# Patient Record
Sex: Female | Born: 1935 | Race: Black or African American | Hispanic: No | Smoking: Never smoker
Health system: Southern US, Community
[De-identification: ages and names within clinical notes are randomized; demographics above are authoritative.]

## PROBLEM LIST (undated history)

## (undated) DIAGNOSIS — N179 Acute kidney failure, unspecified: Secondary | ICD-10-CM

## (undated) DIAGNOSIS — R4020 Unspecified coma: Secondary | ICD-10-CM

## (undated) DIAGNOSIS — R748 Abnormal levels of other serum enzymes: Secondary | ICD-10-CM

## (undated) DIAGNOSIS — B20 Human immunodeficiency virus [HIV] disease: Secondary | ICD-10-CM

## (undated) DIAGNOSIS — F4321 Adjustment disorder with depressed mood: Secondary | ICD-10-CM

## (undated) HISTORY — PX: ABDOMINAL HYSTERECTOMY: SHX81

---

## 2001-03-20 ENCOUNTER — Inpatient Hospital Stay (HOSPITAL_COMMUNITY): Admission: EM | Admit: 2001-03-20 | Discharge: 2001-03-28 | Payer: Self-pay | Admitting: Emergency Medicine

## 2001-03-20 ENCOUNTER — Encounter (INDEPENDENT_AMBULATORY_CARE_PROVIDER_SITE_OTHER): Payer: Self-pay | Admitting: Specialist

## 2001-03-20 ENCOUNTER — Encounter: Payer: Self-pay | Admitting: Emergency Medicine

## 2001-03-21 ENCOUNTER — Encounter: Payer: Self-pay | Admitting: Internal Medicine

## 2001-03-25 ENCOUNTER — Encounter: Payer: Self-pay | Admitting: Internal Medicine

## 2001-03-25 ENCOUNTER — Encounter: Payer: Self-pay | Admitting: Hematology and Oncology

## 2001-04-12 ENCOUNTER — Encounter (INDEPENDENT_AMBULATORY_CARE_PROVIDER_SITE_OTHER): Payer: Self-pay | Admitting: *Deleted

## 2001-04-12 ENCOUNTER — Ambulatory Visit (HOSPITAL_COMMUNITY): Admission: RE | Admit: 2001-04-12 | Discharge: 2001-04-12 | Payer: Self-pay | Admitting: Internal Medicine

## 2001-04-12 ENCOUNTER — Encounter: Admission: RE | Admit: 2001-04-12 | Discharge: 2001-04-12 | Payer: Self-pay | Admitting: Internal Medicine

## 2001-04-12 LAB — CONVERTED CEMR LAB
CD4 Count: 10 microliters
CD4 T Cell Abs: 10

## 2001-04-19 ENCOUNTER — Encounter: Admission: RE | Admit: 2001-04-19 | Discharge: 2001-04-19 | Payer: Self-pay | Admitting: Internal Medicine

## 2001-04-20 ENCOUNTER — Encounter: Admission: RE | Admit: 2001-04-20 | Discharge: 2001-04-20 | Payer: Self-pay | Admitting: Infectious Diseases

## 2001-04-25 ENCOUNTER — Encounter: Admission: RE | Admit: 2001-04-25 | Discharge: 2001-04-25 | Payer: Self-pay | Admitting: Internal Medicine

## 2001-05-03 ENCOUNTER — Encounter (INDEPENDENT_AMBULATORY_CARE_PROVIDER_SITE_OTHER): Payer: Self-pay | Admitting: *Deleted

## 2001-05-03 ENCOUNTER — Other Ambulatory Visit: Admission: RE | Admit: 2001-05-03 | Discharge: 2001-05-03 | Payer: Self-pay | Admitting: Obstetrics

## 2001-05-03 ENCOUNTER — Encounter: Admission: RE | Admit: 2001-05-03 | Discharge: 2001-05-03 | Payer: Self-pay | Admitting: Internal Medicine

## 2001-05-03 LAB — CONVERTED CEMR LAB

## 2001-05-04 ENCOUNTER — Encounter: Admission: RE | Admit: 2001-05-04 | Discharge: 2001-05-04 | Payer: Self-pay | Admitting: Internal Medicine

## 2001-05-10 ENCOUNTER — Encounter: Admission: RE | Admit: 2001-05-10 | Discharge: 2001-05-10 | Payer: Self-pay | Admitting: Internal Medicine

## 2001-05-25 ENCOUNTER — Encounter: Admission: RE | Admit: 2001-05-25 | Discharge: 2001-05-25 | Payer: Self-pay | Admitting: Internal Medicine

## 2001-06-08 ENCOUNTER — Encounter: Admission: RE | Admit: 2001-06-08 | Discharge: 2001-06-08 | Payer: Self-pay | Admitting: Internal Medicine

## 2001-06-19 ENCOUNTER — Inpatient Hospital Stay (HOSPITAL_COMMUNITY): Admission: EM | Admit: 2001-06-19 | Discharge: 2001-07-01 | Payer: Self-pay | Admitting: Emergency Medicine

## 2001-06-27 ENCOUNTER — Encounter: Payer: Self-pay | Admitting: Internal Medicine

## 2001-07-12 ENCOUNTER — Encounter: Admission: RE | Admit: 2001-07-12 | Discharge: 2001-07-12 | Payer: Self-pay | Admitting: Internal Medicine

## 2001-07-22 ENCOUNTER — Encounter: Admission: RE | Admit: 2001-07-22 | Discharge: 2001-07-22 | Payer: Self-pay | Admitting: Internal Medicine

## 2001-07-25 ENCOUNTER — Encounter: Admission: RE | Admit: 2001-07-25 | Discharge: 2001-07-25 | Payer: Self-pay | Admitting: Internal Medicine

## 2001-07-29 ENCOUNTER — Encounter: Admission: RE | Admit: 2001-07-29 | Discharge: 2001-07-29 | Payer: Self-pay | Admitting: Internal Medicine

## 2001-08-05 ENCOUNTER — Encounter: Admission: RE | Admit: 2001-08-05 | Discharge: 2001-08-05 | Payer: Self-pay | Admitting: Internal Medicine

## 2001-08-15 ENCOUNTER — Encounter: Admission: RE | Admit: 2001-08-15 | Discharge: 2001-08-15 | Payer: Self-pay | Admitting: Internal Medicine

## 2001-08-22 ENCOUNTER — Encounter: Admission: RE | Admit: 2001-08-22 | Discharge: 2001-08-22 | Payer: Self-pay | Admitting: Internal Medicine

## 2001-08-30 ENCOUNTER — Encounter: Admission: RE | Admit: 2001-08-30 | Discharge: 2001-08-30 | Payer: Self-pay | Admitting: Internal Medicine

## 2001-09-09 ENCOUNTER — Encounter: Admission: RE | Admit: 2001-09-09 | Discharge: 2001-09-09 | Payer: Self-pay | Admitting: Internal Medicine

## 2001-09-19 ENCOUNTER — Encounter: Admission: RE | Admit: 2001-09-19 | Discharge: 2001-09-19 | Payer: Self-pay

## 2001-09-27 ENCOUNTER — Encounter: Admission: RE | Admit: 2001-09-27 | Discharge: 2001-09-27 | Payer: Self-pay | Admitting: Internal Medicine

## 2001-10-25 ENCOUNTER — Encounter: Admission: RE | Admit: 2001-10-25 | Discharge: 2001-10-25 | Payer: Self-pay | Admitting: Internal Medicine

## 2001-11-22 ENCOUNTER — Ambulatory Visit (HOSPITAL_COMMUNITY): Admission: RE | Admit: 2001-11-22 | Discharge: 2001-11-22 | Payer: Self-pay | Admitting: Internal Medicine

## 2001-11-22 ENCOUNTER — Encounter: Admission: RE | Admit: 2001-11-22 | Discharge: 2001-11-22 | Payer: Self-pay | Admitting: Internal Medicine

## 2001-11-23 ENCOUNTER — Encounter: Admission: RE | Admit: 2001-11-23 | Discharge: 2001-11-23 | Payer: Self-pay | Admitting: Internal Medicine

## 2001-12-06 ENCOUNTER — Encounter: Admission: RE | Admit: 2001-12-06 | Discharge: 2001-12-06 | Payer: Self-pay | Admitting: Internal Medicine

## 2001-12-06 ENCOUNTER — Ambulatory Visit (HOSPITAL_COMMUNITY): Admission: RE | Admit: 2001-12-06 | Discharge: 2001-12-06 | Payer: Self-pay | Admitting: Internal Medicine

## 2001-12-20 ENCOUNTER — Encounter: Admission: RE | Admit: 2001-12-20 | Discharge: 2001-12-20 | Payer: Self-pay | Admitting: Internal Medicine

## 2001-12-23 ENCOUNTER — Ambulatory Visit (HOSPITAL_COMMUNITY): Admission: RE | Admit: 2001-12-23 | Discharge: 2001-12-23 | Payer: Self-pay | Admitting: Internal Medicine

## 2001-12-23 ENCOUNTER — Encounter: Payer: Self-pay | Admitting: Internal Medicine

## 2002-01-03 ENCOUNTER — Encounter: Admission: RE | Admit: 2002-01-03 | Discharge: 2002-01-03 | Payer: Self-pay | Admitting: Internal Medicine

## 2002-01-16 ENCOUNTER — Encounter: Admission: RE | Admit: 2002-01-16 | Discharge: 2002-01-16 | Payer: Self-pay | Admitting: Internal Medicine

## 2002-01-24 ENCOUNTER — Encounter: Admission: RE | Admit: 2002-01-24 | Discharge: 2002-01-24 | Payer: Self-pay | Admitting: Internal Medicine

## 2002-02-15 ENCOUNTER — Encounter: Admission: RE | Admit: 2002-02-15 | Discharge: 2002-02-15 | Payer: Self-pay | Admitting: Internal Medicine

## 2002-03-22 ENCOUNTER — Encounter: Admission: RE | Admit: 2002-03-22 | Discharge: 2002-03-22 | Payer: Self-pay | Admitting: Internal Medicine

## 2002-04-11 ENCOUNTER — Encounter: Admission: RE | Admit: 2002-04-11 | Discharge: 2002-04-11 | Payer: Self-pay | Admitting: Internal Medicine

## 2002-05-09 ENCOUNTER — Encounter: Admission: RE | Admit: 2002-05-09 | Discharge: 2002-05-09 | Payer: Self-pay | Admitting: Internal Medicine

## 2002-05-23 ENCOUNTER — Encounter: Admission: RE | Admit: 2002-05-23 | Discharge: 2002-05-23 | Payer: Self-pay | Admitting: Internal Medicine

## 2002-06-05 ENCOUNTER — Encounter: Admission: RE | Admit: 2002-06-05 | Discharge: 2002-06-05 | Payer: Self-pay | Admitting: Internal Medicine

## 2002-06-15 ENCOUNTER — Ambulatory Visit (HOSPITAL_COMMUNITY): Admission: RE | Admit: 2002-06-15 | Discharge: 2002-06-15 | Payer: Self-pay | Admitting: Internal Medicine

## 2002-06-15 ENCOUNTER — Encounter: Admission: RE | Admit: 2002-06-15 | Discharge: 2002-06-15 | Payer: Self-pay | Admitting: Internal Medicine

## 2002-06-20 ENCOUNTER — Encounter: Admission: RE | Admit: 2002-06-20 | Discharge: 2002-06-20 | Payer: Self-pay | Admitting: Internal Medicine

## 2002-06-21 ENCOUNTER — Encounter: Admission: RE | Admit: 2002-06-21 | Discharge: 2002-06-21 | Payer: Self-pay | Admitting: Internal Medicine

## 2002-06-27 ENCOUNTER — Encounter: Admission: RE | Admit: 2002-06-27 | Discharge: 2002-06-27 | Payer: Self-pay | Admitting: Infectious Diseases

## 2002-07-04 ENCOUNTER — Encounter: Admission: RE | Admit: 2002-07-04 | Discharge: 2002-07-04 | Payer: Self-pay | Admitting: Internal Medicine

## 2002-07-25 ENCOUNTER — Encounter: Admission: RE | Admit: 2002-07-25 | Discharge: 2002-07-25 | Payer: Self-pay | Admitting: Internal Medicine

## 2002-08-08 ENCOUNTER — Encounter: Admission: RE | Admit: 2002-08-08 | Discharge: 2002-08-08 | Payer: Self-pay | Admitting: Internal Medicine

## 2002-08-25 ENCOUNTER — Encounter: Admission: RE | Admit: 2002-08-25 | Discharge: 2002-08-25 | Payer: Self-pay | Admitting: Internal Medicine

## 2002-09-28 ENCOUNTER — Encounter: Admission: RE | Admit: 2002-09-28 | Discharge: 2002-09-28 | Payer: Self-pay | Admitting: Internal Medicine

## 2002-11-21 ENCOUNTER — Encounter: Admission: RE | Admit: 2002-11-21 | Discharge: 2002-11-21 | Payer: Self-pay | Admitting: Internal Medicine

## 2002-11-28 ENCOUNTER — Encounter: Admission: RE | Admit: 2002-11-28 | Discharge: 2002-11-28 | Payer: Self-pay | Admitting: Internal Medicine

## 2002-12-13 ENCOUNTER — Encounter: Admission: RE | Admit: 2002-12-13 | Discharge: 2002-12-13 | Payer: Self-pay | Admitting: Internal Medicine

## 2003-01-16 ENCOUNTER — Encounter: Admission: RE | Admit: 2003-01-16 | Discharge: 2003-01-16 | Payer: Self-pay | Admitting: Internal Medicine

## 2003-02-12 ENCOUNTER — Encounter: Admission: RE | Admit: 2003-02-12 | Discharge: 2003-02-12 | Payer: Self-pay | Admitting: Internal Medicine

## 2003-02-27 ENCOUNTER — Encounter: Admission: RE | Admit: 2003-02-27 | Discharge: 2003-02-27 | Payer: Self-pay | Admitting: Internal Medicine

## 2003-03-13 ENCOUNTER — Encounter: Admission: RE | Admit: 2003-03-13 | Discharge: 2003-03-13 | Payer: Self-pay | Admitting: Internal Medicine

## 2003-05-08 ENCOUNTER — Encounter: Admission: RE | Admit: 2003-05-08 | Discharge: 2003-05-08 | Payer: Self-pay | Admitting: Internal Medicine

## 2003-05-22 ENCOUNTER — Encounter: Admission: RE | Admit: 2003-05-22 | Discharge: 2003-05-22 | Payer: Self-pay | Admitting: Internal Medicine

## 2003-06-05 ENCOUNTER — Encounter: Admission: RE | Admit: 2003-06-05 | Discharge: 2003-06-05 | Payer: Self-pay | Admitting: Internal Medicine

## 2003-07-02 ENCOUNTER — Encounter: Admission: RE | Admit: 2003-07-02 | Discharge: 2003-07-02 | Payer: Self-pay | Admitting: Internal Medicine

## 2003-09-04 ENCOUNTER — Encounter: Admission: RE | Admit: 2003-09-04 | Discharge: 2003-09-04 | Payer: Self-pay | Admitting: Internal Medicine

## 2003-10-23 ENCOUNTER — Encounter: Admission: RE | Admit: 2003-10-23 | Discharge: 2003-10-23 | Payer: Self-pay | Admitting: Internal Medicine

## 2003-11-27 ENCOUNTER — Encounter: Admission: RE | Admit: 2003-11-27 | Discharge: 2003-11-27 | Payer: Self-pay | Admitting: Internal Medicine

## 2003-12-07 ENCOUNTER — Encounter: Admission: RE | Admit: 2003-12-07 | Discharge: 2003-12-07 | Payer: Self-pay | Admitting: Infectious Diseases

## 2003-12-07 ENCOUNTER — Encounter (INDEPENDENT_AMBULATORY_CARE_PROVIDER_SITE_OTHER): Payer: Self-pay | Admitting: *Deleted

## 2004-02-12 ENCOUNTER — Encounter: Admission: RE | Admit: 2004-02-12 | Discharge: 2004-02-12 | Payer: Self-pay | Admitting: Internal Medicine

## 2004-06-08 ENCOUNTER — Encounter (INDEPENDENT_AMBULATORY_CARE_PROVIDER_SITE_OTHER): Payer: Self-pay | Admitting: *Deleted

## 2004-06-09 ENCOUNTER — Ambulatory Visit: Payer: Self-pay | Admitting: Internal Medicine

## 2004-08-12 ENCOUNTER — Ambulatory Visit: Payer: Self-pay | Admitting: Internal Medicine

## 2004-09-02 ENCOUNTER — Ambulatory Visit: Payer: Self-pay | Admitting: Internal Medicine

## 2004-09-29 ENCOUNTER — Ambulatory Visit: Payer: Self-pay | Admitting: Internal Medicine

## 2005-01-12 ENCOUNTER — Ambulatory Visit: Payer: Self-pay | Admitting: Internal Medicine

## 2005-01-15 ENCOUNTER — Encounter (INDEPENDENT_AMBULATORY_CARE_PROVIDER_SITE_OTHER): Payer: Self-pay | Admitting: *Deleted

## 2005-01-15 LAB — CONVERTED CEMR LAB
CD4 Count: 302 uL
HIV 1 RNA Quant: 49 {copies}/mL

## 2005-03-03 ENCOUNTER — Ambulatory Visit: Payer: Self-pay | Admitting: Internal Medicine

## 2005-05-04 ENCOUNTER — Ambulatory Visit: Payer: Self-pay | Admitting: Internal Medicine

## 2005-08-25 ENCOUNTER — Ambulatory Visit: Payer: Self-pay | Admitting: Internal Medicine

## 2006-01-04 ENCOUNTER — Ambulatory Visit: Payer: Self-pay | Admitting: Internal Medicine

## 2006-01-04 ENCOUNTER — Encounter (INDEPENDENT_AMBULATORY_CARE_PROVIDER_SITE_OTHER): Payer: Self-pay | Admitting: *Deleted

## 2006-01-04 LAB — CONVERTED CEMR LAB
CD4 Count: 414 microliters
HIV 1 RNA Quant: 49 copies/mL

## 2006-03-30 ENCOUNTER — Ambulatory Visit: Payer: Self-pay | Admitting: Internal Medicine

## 2006-04-05 ENCOUNTER — Ambulatory Visit: Payer: Self-pay | Admitting: Internal Medicine

## 2006-08-06 ENCOUNTER — Ambulatory Visit: Payer: Self-pay | Admitting: Internal Medicine

## 2006-08-06 LAB — CONVERTED CEMR LAB
ALT: 18 units/L (ref 0–35)
AST: 19 units/L (ref 0–37)
Albumin: 4 g/dL (ref 3.5–5.2)
Alkaline Phosphatase: 80 units/L (ref 39–117)
BUN: 15 mg/dL (ref 6–23)
Basophils Absolute: 0 10*3/uL (ref 0.0–0.1)
Basophils Relative: 0 % (ref 0–1)
CO2: 27 meq/L (ref 19–32)
Calcium: 9.4 mg/dL (ref 8.4–10.5)
Chloride: 105 meq/L (ref 96–112)
Cholesterol: 215 mg/dL — ABNORMAL HIGH (ref 0–200)
Creatinine, Ser: 0.84 mg/dL (ref 0.40–1.20)
Eosinophils Relative: 1 % (ref 0–4)
Glucose, Bld: 70 mg/dL (ref 70–99)
HCT: 39.3 % (ref 34.4–43.3)
HDL: 86 mg/dL (ref >39)
Hemoglobin: 12.4 g/dL (ref 11.7–14.8)
LDL Cholesterol: 114 mg/dL — ABNORMAL HIGH (ref 0–99)
Lymphocytes Relative: 35 % (ref 15–43)
Lymphs Abs: 1.8 10*3/uL (ref 0.8–3.1)
MCHC: 31.6 g/dL — ABNORMAL LOW (ref 33.1–35.4)
MCV: 108.6 fL — ABNORMAL HIGH (ref 78.8–100.0)
Monocytes Absolute: 0.7 10*3/uL (ref 0.2–0.7)
Monocytes Relative: 13 % — ABNORMAL HIGH (ref 3–11)
Neutro Abs: 2.6 10*3/uL (ref 1.8–6.8)
Neutrophils Relative %: 50 % (ref 47–77)
Platelets: 164 10*3/uL (ref 152–374)
Potassium: 4.5 meq/L (ref 3.5–5.3)
RBC: 3.62 M/uL — ABNORMAL LOW (ref 3.79–4.96)
RDW: 13.7 % (ref 11.5–15.3)
Sodium: 138 meq/L (ref 135–145)
Total Bilirubin: 0.5 mg/dL (ref 0.3–1.2)
Total CHOL/HDL Ratio: 2.5
Total Protein: 7.5 g/dL (ref 6.0–8.3)
Triglycerides: 76 mg/dL (ref <150)
VLDL: 15 mg/dL (ref 0–40)
WBC: 5.2 10*3/uL (ref 3.7–10.0)

## 2006-11-04 ENCOUNTER — Ambulatory Visit: Payer: Self-pay | Admitting: Internal Medicine

## 2006-11-15 ENCOUNTER — Encounter (INDEPENDENT_AMBULATORY_CARE_PROVIDER_SITE_OTHER): Payer: Self-pay | Admitting: *Deleted

## 2006-11-15 LAB — CONVERTED CEMR LAB

## 2006-11-28 ENCOUNTER — Encounter (INDEPENDENT_AMBULATORY_CARE_PROVIDER_SITE_OTHER): Payer: Self-pay | Admitting: *Deleted

## 2007-02-25 ENCOUNTER — Ambulatory Visit: Payer: Self-pay | Admitting: Internal Medicine

## 2007-02-25 DIAGNOSIS — B20 Human immunodeficiency virus [HIV] disease: Secondary | ICD-10-CM | POA: Insufficient documentation

## 2007-03-11 ENCOUNTER — Telehealth: Payer: Self-pay | Admitting: Internal Medicine

## 2007-03-16 ENCOUNTER — Telehealth: Payer: Self-pay | Admitting: Internal Medicine

## 2007-04-07 ENCOUNTER — Ambulatory Visit: Payer: Self-pay | Admitting: Internal Medicine

## 2007-04-07 DIAGNOSIS — N259 Disorder resulting from impaired renal tubular function, unspecified: Secondary | ICD-10-CM | POA: Insufficient documentation

## 2007-04-07 DIAGNOSIS — F329 Major depressive disorder, single episode, unspecified: Secondary | ICD-10-CM

## 2007-04-07 DIAGNOSIS — Z8669 Personal history of other diseases of the nervous system and sense organs: Secondary | ICD-10-CM | POA: Insufficient documentation

## 2007-04-07 DIAGNOSIS — R634 Abnormal weight loss: Secondary | ICD-10-CM

## 2007-04-07 DIAGNOSIS — K029 Dental caries, unspecified: Secondary | ICD-10-CM

## 2007-04-07 DIAGNOSIS — F411 Generalized anxiety disorder: Secondary | ICD-10-CM

## 2007-04-07 DIAGNOSIS — Z9189 Other specified personal risk factors, not elsewhere classified: Secondary | ICD-10-CM

## 2007-04-07 DIAGNOSIS — Z8709 Personal history of other diseases of the respiratory system: Secondary | ICD-10-CM | POA: Insufficient documentation

## 2007-04-07 DIAGNOSIS — Z8619 Personal history of other infectious and parasitic diseases: Secondary | ICD-10-CM

## 2007-04-07 DIAGNOSIS — I872 Venous insufficiency (chronic) (peripheral): Secondary | ICD-10-CM | POA: Insufficient documentation

## 2007-04-08 ENCOUNTER — Encounter (INDEPENDENT_AMBULATORY_CARE_PROVIDER_SITE_OTHER): Payer: Self-pay | Admitting: *Deleted

## 2007-04-21 ENCOUNTER — Ambulatory Visit (HOSPITAL_COMMUNITY): Admission: RE | Admit: 2007-04-21 | Discharge: 2007-04-21 | Payer: Self-pay | Admitting: Family Medicine

## 2007-06-01 ENCOUNTER — Telehealth: Payer: Self-pay | Admitting: Internal Medicine

## 2007-07-07 ENCOUNTER — Ambulatory Visit: Payer: Self-pay | Admitting: Internal Medicine

## 2007-07-07 ENCOUNTER — Encounter: Payer: Self-pay | Admitting: Internal Medicine

## 2007-07-07 LAB — CONVERTED CEMR LAB
ALT: 19 units/L (ref 0–35)
Albumin: 4.2 g/dL (ref 3.5–5.2)
Basophils Absolute: 0 10*3/uL (ref 0.0–0.1)
CO2: 23 meq/L (ref 19–32)
Calcium: 9.5 mg/dL (ref 8.4–10.5)
Chloride: 106 meq/L (ref 96–112)
Cholesterol: 225 mg/dL — ABNORMAL HIGH (ref 0–200)
Hemoglobin: 12.5 g/dL (ref 12.0–15.0)
Lymphocytes Relative: 48 % — ABNORMAL HIGH (ref 12–46)
Neutro Abs: 1.6 10*3/uL — ABNORMAL LOW (ref 1.7–7.7)
Platelets: 165 10*3/uL (ref 150–400)
Potassium: 4.2 meq/L (ref 3.5–5.3)
RDW: 13.3 % (ref 11.5–14.0)
Sodium: 141 meq/L (ref 135–145)
Total Protein: 7.4 g/dL (ref 6.0–8.3)
VLDL: 26 mg/dL (ref 0–40)
WBC: 3.8 10*3/uL — ABNORMAL LOW (ref 4.0–10.5)

## 2007-09-19 ENCOUNTER — Encounter (INDEPENDENT_AMBULATORY_CARE_PROVIDER_SITE_OTHER): Payer: Self-pay | Admitting: *Deleted

## 2007-09-20 ENCOUNTER — Encounter (INDEPENDENT_AMBULATORY_CARE_PROVIDER_SITE_OTHER): Payer: Self-pay | Admitting: *Deleted

## 2007-10-06 ENCOUNTER — Telehealth: Payer: Self-pay | Admitting: Internal Medicine

## 2007-10-20 ENCOUNTER — Ambulatory Visit: Payer: Self-pay | Admitting: Internal Medicine

## 2007-11-02 ENCOUNTER — Encounter (INDEPENDENT_AMBULATORY_CARE_PROVIDER_SITE_OTHER): Payer: Self-pay | Admitting: *Deleted

## 2007-12-09 ENCOUNTER — Encounter (INDEPENDENT_AMBULATORY_CARE_PROVIDER_SITE_OTHER): Payer: Self-pay | Admitting: *Deleted

## 2008-02-02 ENCOUNTER — Ambulatory Visit: Payer: Self-pay | Admitting: Internal Medicine

## 2008-03-14 ENCOUNTER — Telehealth: Payer: Self-pay | Admitting: Internal Medicine

## 2008-05-10 ENCOUNTER — Ambulatory Visit: Payer: Self-pay | Admitting: Internal Medicine

## 2008-05-10 ENCOUNTER — Encounter: Admission: RE | Admit: 2008-05-10 | Discharge: 2008-05-10 | Payer: Self-pay | Admitting: Internal Medicine

## 2008-05-10 LAB — CONVERTED CEMR LAB
ALT: 24 units/L (ref 0–35)
AST: 26 units/L (ref 0–37)
Basophils Absolute: 0 10*3/uL (ref 0.0–0.1)
CO2: 22 meq/L (ref 19–32)
Calcium: 9.2 mg/dL (ref 8.4–10.5)
Chloride: 107 meq/L (ref 96–112)
Cholesterol: 215 mg/dL — ABNORMAL HIGH (ref 0–200)
HIV 1 RNA Quant: 2020 copies/mL — ABNORMAL HIGH (ref <50)
HIV-1 RNA Quant, Log: 3.31 — ABNORMAL HIGH (ref <1.70)
Lymphocytes Relative: 49 % — ABNORMAL HIGH (ref 12–46)
Neutro Abs: 1.6 10*3/uL — ABNORMAL LOW (ref 1.7–7.7)
Neutrophils Relative %: 38 % — ABNORMAL LOW (ref 43–77)
Platelets: 158 10*3/uL (ref 150–400)
RDW: 14.5 % (ref 11.5–15.5)
Sodium: 140 meq/L (ref 135–145)
Total Bilirubin: 0.5 mg/dL (ref 0.3–1.2)
Total Protein: 7.5 g/dL (ref 6.0–8.3)
VLDL: 18 mg/dL (ref 0–40)

## 2008-05-24 ENCOUNTER — Ambulatory Visit: Payer: Self-pay | Admitting: Internal Medicine

## 2008-05-24 ENCOUNTER — Encounter (INDEPENDENT_AMBULATORY_CARE_PROVIDER_SITE_OTHER): Payer: Self-pay | Admitting: *Deleted

## 2008-05-24 DIAGNOSIS — F4321 Adjustment disorder with depressed mood: Secondary | ICD-10-CM | POA: Insufficient documentation

## 2008-05-24 HISTORY — DX: Adjustment disorder with depressed mood: F43.21

## 2008-06-28 ENCOUNTER — Encounter: Payer: Self-pay | Admitting: Internal Medicine

## 2008-07-05 ENCOUNTER — Ambulatory Visit: Payer: Self-pay | Admitting: Internal Medicine

## 2008-07-05 LAB — CONVERTED CEMR LAB

## 2008-07-11 ENCOUNTER — Encounter: Payer: Self-pay | Admitting: Internal Medicine

## 2008-07-12 ENCOUNTER — Telehealth: Payer: Self-pay

## 2008-07-20 ENCOUNTER — Ambulatory Visit: Payer: Self-pay | Admitting: Internal Medicine

## 2008-07-24 ENCOUNTER — Encounter: Payer: Self-pay | Admitting: Internal Medicine

## 2008-08-23 ENCOUNTER — Ambulatory Visit: Payer: Self-pay | Admitting: Internal Medicine

## 2008-08-30 ENCOUNTER — Ambulatory Visit: Payer: Self-pay | Admitting: Internal Medicine

## 2008-11-01 ENCOUNTER — Ambulatory Visit: Payer: Self-pay | Admitting: Internal Medicine

## 2008-11-01 ENCOUNTER — Encounter (INDEPENDENT_AMBULATORY_CARE_PROVIDER_SITE_OTHER): Payer: Self-pay | Admitting: *Deleted

## 2008-11-16 ENCOUNTER — Ambulatory Visit: Payer: Self-pay | Admitting: Internal Medicine

## 2008-11-16 ENCOUNTER — Encounter: Payer: Self-pay | Admitting: Internal Medicine

## 2008-11-16 LAB — CONVERTED CEMR LAB
ALT: 34 units/L (ref 0–35)
Albumin: 4.1 g/dL (ref 3.5–5.2)
CO2: 19 meq/L (ref 19–32)
Calcium: 9.2 mg/dL (ref 8.4–10.5)
Chloride: 110 meq/L (ref 96–112)
Cholesterol: 237 mg/dL — ABNORMAL HIGH (ref 0–200)
Glucose, Bld: 85 mg/dL (ref 70–99)
Potassium: 4.2 meq/L (ref 3.5–5.3)
Sodium: 141 meq/L (ref 135–145)
Total Protein: 7.1 g/dL (ref 6.0–8.3)
Triglycerides: 59 mg/dL (ref <150)
VLDL: 12 mg/dL (ref 0–40)

## 2009-02-01 ENCOUNTER — Encounter (INDEPENDENT_AMBULATORY_CARE_PROVIDER_SITE_OTHER): Payer: Self-pay | Admitting: *Deleted

## 2009-02-21 ENCOUNTER — Ambulatory Visit: Payer: Self-pay | Admitting: Internal Medicine

## 2009-02-21 DIAGNOSIS — R197 Diarrhea, unspecified: Secondary | ICD-10-CM

## 2009-03-28 ENCOUNTER — Telehealth (INDEPENDENT_AMBULATORY_CARE_PROVIDER_SITE_OTHER): Payer: Self-pay | Admitting: *Deleted

## 2009-03-28 ENCOUNTER — Ambulatory Visit: Payer: Self-pay | Admitting: Internal Medicine

## 2009-03-28 LAB — CONVERTED CEMR LAB
Albumin: 4 g/dL (ref 3.5–5.2)
BUN: 14 mg/dL (ref 6–23)
CO2: 24 meq/L (ref 19–32)
Cholesterol: 204 mg/dL — ABNORMAL HIGH (ref 0–200)
Glucose, Bld: 72 mg/dL (ref 70–99)
HDL: 84 mg/dL (ref >39)
Hemoglobin: 13.1 g/dL (ref 12.0–15.0)
Lymphocytes Relative: 47 % — ABNORMAL HIGH (ref 12–46)
Lymphs Abs: 1.9 10*3/uL (ref 0.7–4.0)
Monocytes Relative: 12 % (ref 3–12)
Neutro Abs: 1.6 10*3/uL — ABNORMAL LOW (ref 1.7–7.7)
Neutrophils Relative %: 40 % — ABNORMAL LOW (ref 43–77)
Potassium: 4.1 meq/L (ref 3.5–5.3)
RBC: 3.78 M/uL — ABNORMAL LOW (ref 3.87–5.11)
Sodium: 139 meq/L (ref 135–145)
Total CHOL/HDL Ratio: 2.4
Total Protein: 7 g/dL (ref 6.0–8.3)
Triglycerides: 87 mg/dL (ref <150)
WBC: 4.1 10*3/uL (ref 4.0–10.5)

## 2009-08-02 ENCOUNTER — Ambulatory Visit: Payer: Self-pay | Admitting: Internal Medicine

## 2009-08-02 ENCOUNTER — Encounter: Payer: Self-pay | Admitting: Internal Medicine

## 2009-08-02 LAB — CONVERTED CEMR LAB
Albumin: 4.2 g/dL (ref 3.5–5.2)
Alkaline Phosphatase: 81 units/L (ref 39–117)
BUN: 19 mg/dL (ref 6–23)
CD4 Count: 587 microliters
Creatinine, Ser: 0.8 mg/dL (ref 0.40–1.20)
Glucose, Bld: 80 mg/dL (ref 70–99)
HDL: 76 mg/dL (ref >39)
LDL Cholesterol: 110 mg/dL — ABNORMAL HIGH (ref 0–99)
Potassium: 3.9 meq/L (ref 3.5–5.3)
Total Bilirubin: 0.4 mg/dL (ref 0.3–1.2)
Triglycerides: 67 mg/dL (ref <150)

## 2009-09-03 ENCOUNTER — Ambulatory Visit: Payer: Self-pay | Admitting: Internal Medicine

## 2009-11-28 ENCOUNTER — Telehealth (INDEPENDENT_AMBULATORY_CARE_PROVIDER_SITE_OTHER): Payer: Self-pay | Admitting: *Deleted

## 2009-12-12 ENCOUNTER — Encounter (INDEPENDENT_AMBULATORY_CARE_PROVIDER_SITE_OTHER): Payer: Self-pay | Admitting: *Deleted

## 2009-12-12 ENCOUNTER — Ambulatory Visit: Payer: Self-pay | Admitting: Internal Medicine

## 2009-12-12 LAB — CONVERTED CEMR LAB
AST: 26 units/L (ref 0–37)
Albumin: 4 g/dL (ref 3.5–5.2)
Alkaline Phosphatase: 83 units/L (ref 39–117)
BUN: 17 mg/dL (ref 6–23)
Glucose, Bld: 69 mg/dL — ABNORMAL LOW (ref 70–99)
HDL: 77 mg/dL (ref >39)
HIV 1 RNA Quant: 709 copies/mL
LDL Cholesterol: 106 mg/dL — ABNORMAL HIGH (ref 0–99)
Sodium: 142 meq/L (ref 135–145)
Total Bilirubin: 0.4 mg/dL (ref 0.3–1.2)
Total CHOL/HDL Ratio: 2.6
Triglycerides: 81 mg/dL (ref <150)
VLDL: 16 mg/dL (ref 0–40)

## 2010-01-01 ENCOUNTER — Encounter: Payer: Self-pay | Admitting: Internal Medicine

## 2010-01-01 ENCOUNTER — Telehealth: Payer: Self-pay | Admitting: Internal Medicine

## 2010-01-09 ENCOUNTER — Encounter (INDEPENDENT_AMBULATORY_CARE_PROVIDER_SITE_OTHER): Payer: Self-pay | Admitting: *Deleted

## 2010-05-08 ENCOUNTER — Ambulatory Visit: Payer: Self-pay | Admitting: Internal Medicine

## 2010-05-08 LAB — CONVERTED CEMR LAB
ALT: 28 units/L (ref 0–35)
AST: 34 units/L (ref 0–37)
Albumin: 3.8 g/dL (ref 3.5–5.2)
Alkaline Phosphatase: 80 units/L (ref 39–117)
BUN: 14 mg/dL (ref 6–23)
Basophils Absolute: 0 10*3/uL (ref 0.0–0.1)
Basophils Relative: 0 % (ref 0–1)
Chloride: 107 meq/L (ref 96–112)
Eosinophils Absolute: 0 10*3/uL (ref 0.0–0.7)
HDL: 60 mg/dL (ref >39)
HIV 1 RNA Quant: 43900 copies/mL — ABNORMAL HIGH (ref <48)
HIV-1 RNA Quant, Log: 4.64 — ABNORMAL HIGH (ref <1.68)
LDL Cholesterol: 102 mg/dL — ABNORMAL HIGH (ref 0–99)
MCHC: 33 g/dL (ref 30.0–36.0)
MCV: 98.5 fL (ref 78.0–100.0)
Monocytes Relative: 9 % (ref 3–12)
Neutro Abs: 1.4 10*3/uL — ABNORMAL LOW (ref 1.7–7.7)
Neutrophils Relative %: 42 % — ABNORMAL LOW (ref 43–77)
Platelets: 131 10*3/uL — ABNORMAL LOW (ref 150–400)
Potassium: 4.2 meq/L (ref 3.5–5.3)
RDW: 14 % (ref 11.5–15.5)
Sodium: 140 meq/L (ref 135–145)

## 2010-05-13 ENCOUNTER — Encounter: Payer: Self-pay | Admitting: Internal Medicine

## 2010-06-05 ENCOUNTER — Ambulatory Visit: Payer: Self-pay | Admitting: Internal Medicine

## 2010-06-05 LAB — CONVERTED CEMR LAB: HIV 1 RNA Quant: 85400 copies/mL — ABNORMAL HIGH (ref <20)

## 2010-06-19 ENCOUNTER — Ambulatory Visit: Payer: Self-pay | Admitting: Internal Medicine

## 2010-07-24 ENCOUNTER — Ambulatory Visit: Payer: Self-pay | Admitting: Internal Medicine

## 2010-07-24 LAB — CONVERTED CEMR LAB: HIV-1 RNA Quant, Log: 2.83 — ABNORMAL HIGH (ref <1.30)

## 2010-09-04 ENCOUNTER — Ambulatory Visit: Payer: Self-pay | Admitting: Internal Medicine

## 2010-10-19 LAB — CONVERTED CEMR LAB
ALT: 18 units/L (ref 0–35)
ALT: 19 units/L (ref 0–35)
ALT: 21 units/L (ref 0–35)
ALT: 22 units/L (ref 0–35)
AST: 19 units/L (ref 0–37)
AST: 19 units/L (ref 0–37)
AST: 19 units/L (ref 0–37)
Albumin: 4.1 g/dL (ref 3.5–5.2)
Albumin: 4.1 g/dL (ref 3.5–5.2)
Alkaline Phosphatase: 77 units/L (ref 39–117)
Alkaline Phosphatase: 79 units/L (ref 39–117)
BUN: 15 mg/dL (ref 6–23)
BUN: 17 mg/dL (ref 6–23)
CD4 Count: 445 microliters
CD4 Count: 483 microliters
CD4 Count: 503 microliters
CD4 Count: 554 microliters
CO2: 22 meq/L (ref 19–32)
CO2: 22 meq/L (ref 19–32)
CO2: 24 meq/L (ref 19–32)
Calcium: 8.9 mg/dL (ref 8.4–10.5)
Calcium: 9.1 mg/dL (ref 8.4–10.5)
Calcium: 9.3 mg/dL (ref 8.4–10.5)
Calcium: 9.3 mg/dL (ref 8.4–10.5)
Chloride: 105 meq/L (ref 96–112)
Chloride: 108 meq/L (ref 96–112)
Chloride: 109 meq/L (ref 96–112)
Cholesterol: 207 mg/dL — ABNORMAL HIGH (ref 0–200)
Cholesterol: 221 mg/dL — ABNORMAL HIGH (ref 0–200)
Creatinine, Ser: 0.77 mg/dL (ref 0.40–1.20)
Creatinine, Ser: 0.8 mg/dL (ref 0.40–1.20)
Creatinine, Ser: 0.83 mg/dL (ref 0.40–1.20)
Creatinine, Ser: 0.89 mg/dL (ref 0.40–1.20)
Creatinine, Urine: 20.1 mg/dL
Glucose, Bld: 67 mg/dL — ABNORMAL LOW (ref 70–99)
Glucose, Bld: 93 mg/dL (ref 70–99)
HCV Ab: NEGATIVE
HDL: 81 mg/dL (ref >39)
HDL: 84 mg/dL (ref >39)
HIV 1 RNA Quant: 1601 copies/mL
HIV 1 RNA Quant: 198 copies/mL
HIV 1 RNA Quant: 416 copies/mL
HIV 1 RNA Quant: 49 copies/mL
Hep A Total Ab: NEGATIVE
Hep B Core Total Ab: POSITIVE — AB
Hepatitis B Surface Ag: NEGATIVE
LDL Cholesterol: 118 mg/dL — ABNORMAL HIGH (ref 0–99)
LDL Cholesterol: 118 mg/dL — ABNORMAL HIGH (ref 0–99)
LDL Cholesterol: 120 mg/dL — ABNORMAL HIGH (ref 0–99)
Potassium: 3.8 meq/L (ref 3.5–5.3)
Potassium: 3.8 meq/L (ref 3.5–5.3)
Sodium: 141 meq/L (ref 135–145)
Sodium: 142 meq/L (ref 135–145)
Sodium: 143 meq/L (ref 135–145)
Total Bilirubin: 0.4 mg/dL (ref 0.3–1.2)
Total Bilirubin: 0.4 mg/dL (ref 0.3–1.2)
Total Bilirubin: 0.5 mg/dL (ref 0.3–1.2)
Total CHOL/HDL Ratio: 2.5
Total CHOL/HDL Ratio: 2.6
Total CHOL/HDL Ratio: 2.6
Total CHOL/HDL Ratio: 2.9
Total Protein, Urine: 1
Total Protein, Urine: 3
Total Protein: 7.3 g/dL (ref 6.0–8.3)
Total Protein: 7.4 g/dL (ref 6.0–8.3)
Total Protein: 7.8 g/dL (ref 6.0–8.3)
Triglycerides: 82 mg/dL (ref <150)
Triglycerides: 83 mg/dL (ref <150)
Triglycerides: 97 mg/dL (ref <150)
VLDL: 15 mg/dL (ref 0–40)
VLDL: 17 mg/dL (ref 0–40)
VLDL: 17 mg/dL (ref 0–40)
VLDL: 19 mg/dL (ref 0–40)

## 2010-10-23 ENCOUNTER — Encounter: Payer: Self-pay | Admitting: Internal Medicine

## 2010-10-23 ENCOUNTER — Ambulatory Visit (INDEPENDENT_AMBULATORY_CARE_PROVIDER_SITE_OTHER): Payer: Medicare PPO

## 2010-10-23 DIAGNOSIS — B20 Human immunodeficiency virus [HIV] disease: Secondary | ICD-10-CM

## 2010-10-23 LAB — CONVERTED CEMR LAB
ALT: 16 units/L (ref 0–35)
AST: 24 units/L (ref 0–37)
Albumin: 4.3 g/dL (ref 3.5–5.2)
CD4 Count: 438 microliters
CO2: 25 meq/L (ref 19–32)
Calcium: 9.8 mg/dL (ref 8.4–10.5)
Chloride: 102 meq/L (ref 96–112)
Cholesterol: 234 mg/dL — ABNORMAL HIGH (ref 0–200)
Creatinine, Ser: 0.96 mg/dL (ref 0.40–1.20)
Creatinine, Urine: 118.3 mg/dL
HCV Ab: NEGATIVE
HIV 1 RNA Quant: 39 copies/mL
Hep A Total Ab: NEGATIVE
Hep B Core Total Ab: POSITIVE — AB
Hepatitis B Surface Ag: NEGATIVE
Potassium: 4.5 meq/L (ref 3.5–5.3)
Total Protein: 8 g/dL (ref 6.0–8.3)
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
## 2010-10-23 NOTE — Progress Notes (Signed)
Summary: Pharmacy unable to send meds can't reach pt  Phone Note From Pharmacy   Caller: [facility] ([facility] Details for Reason: [facility] phone number is [phone] Summary of Call: Received a message from [facility] pharmacy ([facility]) stating that they are trying to reach patient.  They tried calling her 3 times to set up her order.  They called to see if we had another phone number on file for her.  We have the same phone number in our system that they have.  I tried to call patient, but was unable to leave a message.  Will try again and inform the new pharmacy that patient is unreachable.  Hopefully, patient will call office before we move in reference to her medications not being delivered.   Initial call taken by: Paulo Fruit  BS,CPht II,MPH,  November 28, 2009 9:44 AM

## 2010-10-23 NOTE — Assessment & Plan Note (Signed)
   Process Orders Check Orders Results:     [facility]: Check successful Order queued for requisitioning for [facility]: May 08, 2010 10:56 AM  Tests Sent for requisitioning (May 08, 2010 10:56 AM):     05/08/2010: [facility] -- T-HIV Viral Load [phone] (signed)

## 2010-10-23 NOTE — Assessment & Plan Note (Signed)
Summary: STUDY APPT/ LH    Current Allergies: No known allergies  Vital Signs:  Patient profile:   77 year old female Menstrual status:  hysterectomy, partial Weight:      112.2 pounds (51 kg) BMI:     21.99 Temp:     97.0 degrees F oral Pulse rate:   43 / minute BP sitting:   118 / 64  (right arm) Is Patient Diabetic? No Research Study Name: ALLRT Pain Assessment Patient in pain? no      Nutritional Status BMI of 19 -24 = normal  Does patient need assistance? Functional Status Self care Ambulation Normal   Patient here for week 448 ALLRT study visit. She denies any new complaints, but is worried about her sister who is in the hospital. Deirdre Evener RN  December 12, 2009 11:51 AM   Other Orders: Est. Patient Research Study [id number]) T-Comprehensive Metabolic Panel [id number]) T-Lipid Profile [id number]) Process Orders Check Orders Results:     [facility]: ABN not required for this insurance Tests Sent for requisitioning (December 12, 2009 11:50 AM):     12/12/2009: [facility] -- T-Comprehensive Metabolic Panel [[id number]] (signed)     12/12/2009: [facility] -- T-Lipid Profile [phone] (signed)

## 2010-10-23 NOTE — Assessment & Plan Note (Signed)
Summary: STUDY APPT/ LH    Current Allergies: No known allergies  Vital Signs:  Patient profile:   77 year old female Menstrual status:  hysterectomy, partial Weight:      105.5 pounds (47.95 kg) BMI:     20.68 Temp:     97.4 degrees F oral Pulse rate:   44 / minute BP sitting:   143 / 71  (left arm) Is Patient Diabetic? No Pain Assessment Patient in pain? no      Nutritional Status BMI of 19 -24 = normal  Does patient need assistance? Functional Status Self care Ambulation Normal   Patient here for week 464 ALLRT study visit. She c/o fatigue recently related to her taking care of her sister who has been ill past few months. Last month she had a GI problem that lasted about 4days with signs of nausea, vomiting and diarrhea. She plans to schedule an appt to see Dr. Orvan Falconer soon. VL and CD4 were ordered off study for his review, since they were not part of this study visit.Deirdre Evener RN  May 08, 2010 11:19 AM   Other Orders: Est. Patient Research Study [phone]) T-CBC w/Diff [phone]) T-Lipid Profile [id number]) T-Comprehensive Metabolic Panel [id number])  Process Orders Check Orders Results:     [facility]: Order checked:     22930 -- T-Lipid Profile -- ABN required due to diagnosis (CPT: 80061) Tests Sent for requisitioning (May 08, 2010 11:16 AM):     05/08/2010: [facility] -- T-CBC w/Diff [[id number]] (signed)     05/08/2010: [facility] -- T-Lipid Profile [phone] (signed)     05/08/2010: [facility] -- T-Comprehensive Metabolic Panel [phone] (signed)

## 2010-10-23 NOTE — Miscellaneous (Signed)
Summary: HVI-1 RNA, CD4 (RESEARCH)  Clinical Lists Changes  Observations: Added new observation of CD4 COUNT: 502 microliters (12/12/2009 16:00) Added new observation of HIV1RNA QA: 709 copies/mL (12/12/2009 16:00)

## 2010-10-23 NOTE — Medication Information (Signed)
[facility]  [facility]   Imported By: Florinda Marker 01/02/2010 16:01:38  _____________________________________________________________________  External Attachment:    Type:   Image     Comment:   External Document

## 2010-10-23 NOTE — Miscellaneous (Signed)
Summary: clinical update/ryan white [facility] app completed  Clinical Lists Changes  Observations: Added new observation of PCTFPL: 146.60  (12/12/2009 9:30) Added new observation of HOUSEINCOME: 95621  (12/12/2009 9:30) Added new observation of FINASSESSDT: 12/12/2009  (12/12/2009 9:30)

## 2010-10-23 NOTE — Progress Notes (Signed)
Summary: refill/mld  Phone Note Refill Request      Prescriptions: ATIVAN 0.5 MG  TABS (LORAZEPAM) Take 1 tablet by mouth three times a day as needed for anxiety  #60 x 0   Entered by:   Paulo Fruit  BS,CPht II,MPH   Authorized by:   Cliffton Asters MD   Signed by:   Paulo Fruit  BS,CPht II,MPH on 01/01/2010   Method used:   Print[facility] ...       [facility] [address] [phone]* (retail)       [address]       [place], [place]  [address]       Ph: [phone] or [phone]       Fax: [phone]   RxID:   [id number]  See scanned approval. Paulo Fruit  BS,CPht II,MPH  January 01, 2010 4:39 PM

## 2010-10-23 NOTE — Assessment & Plan Note (Signed)
Summary: 6WKS F/U [MKJ]   CC:  follow-up visit.  History of Present Illness: [facility] Ruiz is in for her routine visit.  She denies missing a single dose of her medication since her last visit.  She takes Lomotil and finds that it helps her control her chronic diarrhea.  She continues to be bothered by chronic insomnia and occasional social anxiety.  Preventive Screening-Counseling & Management  Alcohol-Tobacco     Alcohol drinks/day: 0     Smoking Status: never     Passive Smoke Exposure: no  Caffeine-Diet-Exercise     Caffeine use/day: tea occassionally     Does Patient Exercise: yes     Type of exercise: active-yard work, stays busy     Exercise (avg: min/session): >60     Times/week: 6  Hep-HIV-STD-Contraception     HIV Risk: no risk noted     HIV Risk Counseling: not indicated-no HIV risk noted  Safety-Violence-Falls     Seat Belt Use: yes  Comments: declined condoms      Sexual History:  n/a.        Drug Use:  never.     Prior Medication List:  COMBIVIR 150-300 MG  TABS (LAMIVUDINE-ZIDOVUDINE) Take 1 tablet by mouth two times a day INTELENCE 100 MG TABS (ETRAVIRINE) Take2 tablets by mouth two times a day PREZISTA 300 MG TABS (DARUNAVIR) Take 2 tablets by mouth twice a day NORVIR 100 MG TABS (RITONAVIR) Take 1 tablet by mouth two times a day ATIVAN 0.5 MG  TABS (LORAZEPAM) Take 1 tablet by mouth three times a day as needed for anxiety AMBIEN 5 MG  TABS (ZOLPIDEM TARTRATE) Take 1 tablet by mouth at bedtime as needed sleep LOMOTIL 2.5-0.025 MG  TABS (DIPHENOXYLATE-ATROPINE) Take 1 tablet by mouth every 4 hours as needed   Current Allergies (reviewed today): No known allergies  Vital Signs:  Patient profile:   77 year old female Menstrual status:  hysterectomy, partial Height:      60 inches (152.40 cm) Weight:      105.75 pounds (48.07 kg) BMI:     20.73 Temp:     98.0 degrees F (36.67 degrees C) oral Pulse rate:   77 / minute BP sitting:   134 / 80  (left  arm) Cuff size:   regular  Vitals Entered By: Jennet Maduro RN (September 04, 2010 10:00 AM) CC: follow-up visit Is Patient Diabetic? No Pain Assessment Patient in pain? no      Nutritional Status BMI of 19 -24 = normal Nutritional Status Detail appetite "too good"  Have you ever been in a relationship where you felt threatened, hurt or afraid?No   Does patient need assistance? Functional Status Self care Ambulation Normal Comments no missed doses of rxes   Physical Exam  General:  alert and well-nourished.   Mouth:  good dentition, pharynx pink and moist, no erythema, and no exudates.   Lungs:  normal breath sounds.  no crackles and no wheezes.   Heart:  normal rate, regular rhythm, and no murmur.   Skin:  no rashes.   Axillary Nodes:  no R axillary adenopathy and no L axillary adenopathy.   Psych:  normally interactive, good eye contact, not anxious appearing, and not depressed appearing.          Medication Adherence: 09/04/2010   Adherence to medications reviewed with patient. Counseling to provide adequate adherence provided  Impression & Recommendations:  Problem # 1:  HIV INFECTION (ICD-042) I cannot explain her recent spike and viral load but fortunately it is trending back down.  I will continue her current regimen. Diagnostics Reviewed:  HIV: CDC-defined AIDS (08/30/2008)   CD4: 340 (07/25/2010)   CD4 %: 29.6 (11/04/2006) WBC: 3.4 (05/08/2010)   Hgb: 13.1 (05/08/2010)   HCT: 39.7 (05/08/2010)   Platelets: 131 (05/08/2010) HIV genotype: See Comment (06/19/2010)   HIV-1 RNA: 674 (07/24/2010)   HBSAg: No (11/15/2006)  Orders: Est. Patient Level IV (99214)Future Orders: T-CD4SP ([facility]) (CD4SP) ... 12/03/2010 T-HIV Viral Load [id number]) ... 12/03/2010 T-Comprehensive Metabolic Panel [id number]) ... 12/03/2010 T-CBC w/Diff ([id number]) ... 12/03/2010 T-RPR (Syphilis) [id number]) ... 12/03/2010 T-Lipid Profile  [phone]) ... 12/03/2010  Problem # 2:  DIARRHEA (ICD-787.91) This is under better control with Lomotil. Her updated medication list for this problem includes:    Lomotil 2.5-0.025 Mg Tabs (Diphenoxylate-atropine) .Marland Kitchen... Take 1 tablet by mouth every 4 hours as needed  Orders: Est. Patient Level IV (02725)  Problem # 3:  INSOMNIA, HX OF (ICD-V15.89) I have agreed to refill her Ambien for her to use carefully.  Problem # 4:  ANXIETY (ICD-300.00) I have agreed to refill her Ativan. Her updated medication list for this problem includes:    Ativan 0.5 Mg Tabs (Lorazepam) .Marland Kitchen... Take 1 tablet by mouth three times a day as needed for anxiety  Orders: Est. Patient Level IV (36644)  Patient Instructions: 1)  Please schedule a follow-up appointment in 3 months.  Prescriptions: ATIVAN 0.5 MG  TABS (LORAZEPAM) Take 1 tablet by mouth three times a day as needed for anxiety  #60 x 2   Entered and Authorized by:   Cliffton Asters MD   Signed by:   Cliffton Asters MD on 09/04/2010   Method used:   Print then Give to Patient   RxID:   [id number] AMBIEN 5 MG  TABS (ZOLPIDEM TARTRATE) Take 1 tablet by mouth at bedtime as needed sleep  #30 x 4   Entered and Authorized by:   Cliffton Asters MD   Signed by:   Cliffton Asters MD on 09/04/2010   Method used:   Print then Give to Patient   RxID:   [id number]   Prevention & Chronic Care Immunizations   Influenza vaccine: Fluvax MCR  (06/05/2010)    Tetanus booster: Not documented    Pneumococcal vaccine: Pneumovax (Medicare)  (04/07/2007)    H. zoster vaccine: Not documented  Colorectal Screening   Hemoccult: Not documented    Colonoscopy: Not documented  Other Screening   Pap smear: Hyst 2005 NI  (07/05/2008)    Mammogram: Not documented    DXA bone density scan: Not documented   Smoking status: never  (09/04/2010)  Lipids   Total Cholesterol: 177  (05/08/2010)   LDL: 102  (05/08/2010)   LDL Direct: Not documented    HDL: 60  (05/08/2010)   Triglycerides: 77  (05/08/2010)

## 2010-10-23 NOTE — Assessment & Plan Note (Signed)
Summary: F/U/VS   CC:  follow-up visit.  History of Present Illness: Al[facility] Ruiz is in for her routine visit. She has been taking one Imodium each day and finds that her diarrhea is much improved.  She denies missing any doses of her HIV medications since her last visit.  She gets it through [facility] in the mail it to her promptly each month.  Preventive Screening-Counseling & Management  Alcohol-Tobacco     Alcohol drinks/day: 0     Smoking Status: never     Passive Smoke Exposure: no  Caffeine-Diet-Exercise     Caffeine use/day: tea occassionally     Does Patient Exercise: yes     Type of exercise: active-yard work, stays busy     Exercise (avg: min/session): >60     Times/week: 6  Hep-HIV-STD-Contraception     HIV Risk: no     HIV Risk Counseling: not indicated-no HIV risk noted  Safety-Violence-Falls     Seat Belt Use: yes  Comments: declined condoms      Sexual History:  n/a.        Drug Use:  never.     Prior Medication List:  COMBIVIR 150-300 MG  TABS (LAMIVUDINE-ZIDOVUDINE) Take 1 tablet by mouth two times a day INTELENCE 100 MG TABS (ETRAVIRINE) Take2 tablets by mouth two times a day PREZISTA 300 MG TABS (DARUNAVIR) Take 2 tablets by mouth twice a day NORVIR 100 MG TABS (RITONAVIR) Take 1 tablet by mouth two times a day ATIVAN 0.5 MG  TABS (LORAZEPAM) Take 1 tablet by mouth three times a day as needed for anxiety AMBIEN 5 MG  TABS (ZOLPIDEM TARTRATE) Take 1 tablet by mouth at bedtime as needed sleep LOMOTIL 2.5-0.025 MG  TABS (DIPHENOXYLATE-ATROPINE) Take 1 tablet by mouth every 4 hours as needed   Current Allergies (reviewed today): No known allergies  Vital Signs:  Patient profile:   77 year old female Menstrual status:  hysterectomy, partial Height:      60 inches (152.40 cm) Weight:      105.5 pounds (47.95 kg) BMI:     20.68 Temp:     97.8 degrees F (36.56 degrees C) oral Pulse rate:   70 / minute BP sitting:   150 / 79  (left  arm) Cuff size:   regular  Vitals Entered By: Jennet Maduro RN (July 24, 2010 11:23 AM) CC: follow-up visit Is Patient Diabetic? No Pain Assessment Patient in pain? no      Nutritional Status BMI of 19 -24 = normal Nutritional Status Detail appetite "good"  Have you ever been in a relationship where you felt threatened, hurt or afraid?No   Does patient need assistance? Functional Status Self care Ambulation Normal Comments no missed doses of rxes   Physical Exam  General:  alert and well-nourished.   Mouth:  good dentition, pharynx pink and moist, no erythema, and no exudates.   Lungs:  normal breath sounds.  no crackles and no wheezes.   Heart:  normal rate, regular rhythm, and no murmur.          Medication Adherence: 07/24/2010   Adherence to medications reviewed with patient. Counseling to provide adequate adherence provided                                Impression & Recommendations:  Problem # 1:  HIV INFECTION (ICD-042) I still do not have an explanation for her rising  viral load over the past year.  She is had very good control in the past and it appears that her adherence is still very good.  She has no evidence of resistance mutations on a recent genotype.  Her diarrhea is under better control and this may lead to better absorption of her medications.  I will repeat her CD4 and viral load today and check with [facility] pharmacy to see that she is indeed being shipped all of the appropriate medications. Diagnostics Reviewed:  HIV: CDC-defined AIDS (08/30/2008)   CD4: 280 (06/06/2010)   CD4 %: 29.6 (11/04/2006) WBC: 3.4 (05/08/2010)   Hgb: 13.1 (05/08/2010)   HCT: 39.7 (05/08/2010)   Platelets: 131 (05/08/2010) HIV genotype: See Comment (06/19/2010)   HIV-1 RNA: 85400 (06/05/2010)   HBSAg: No (11/15/2006)  Orders: Est. Patient Level III ([address]) T-CD4SP ([facility]) (CD4SP) T-HIV Viral Load ([id number])  Patient Instructions: 1)  Please schedule  a follow-up appointment in 6 weeks.         Medication Adherence: 07/24/2010   Adherence to medications reviewed with patient. Counseling to provide adequate adherence provided                               Process Orders Check Orders Results:     [facility]: Check successful Tests Sent for requisitioning (July 24, 2010 12:58 PM):     07/24/2010: [facility] -- T-HIV Viral Load [phone] (signed)

## 2010-10-23 NOTE — Assessment & Plan Note (Signed)
Summary: 2WK F/U/VS   CC:  follow-up visit.  History of Present Illness: Alison Ruiz is in for her routine visit.  She says that she feels that her pill box every Sunday. She did not bring her pill box with her but she has all of her bottles of medications.  The bottles were all filled on September 8 but she did not receive them in the mail until September 15.  She denies missing any medication since her last visit.  Indeed, pill counts appear to be correct.  She does continue to have her chronic frequent bowel movements, usually about 3 to 4 daily.  They are usually loose and only occasionally diarrhea. She does not take Imodium on a regular basis because she worries that it might make her constipated.  Preventive Screening-Counseling & Management  Alcohol-Tobacco     Alcohol drinks/day: 0     Smoking Status: never     Passive Smoke Exposure: no  Caffeine-Diet-Exercise     Caffeine use/day: tea occassionally     Does Patient Exercise: yes     Type of exercise: active-yard work, stays busy     Exercise (avg: min/session): >60     Times/week: 6  Hep-HIV-STD-Contraception     HIV Risk: no     HIV Risk Counseling: not indicated-no HIV risk noted  Safety-Violence-Falls     Seat Belt Use: yes  Comments: declined condoms      Sexual History:  n/a.        Drug Use:  never.     Prior Medication List:  COMBIVIR 150-300 MG  TABS (LAMIVUDINE-ZIDOVUDINE) Take 1 tablet by mouth two times a day INTELENCE 100 MG TABS (ETRAVIRINE) Take2 tablets by mouth two times a day PREZISTA 300 MG TABS (DARUNAVIR) Take 2 tablets by mouth twice a day NORVIR 100 MG TABS (RITONAVIR) Take 1 tablet by mouth two times a day ATIVAN 0.5 MG  TABS (LORAZEPAM) Take 1 tablet by mouth three times a day as needed for anxiety AMBIEN 5 MG  TABS (ZOLPIDEM TARTRATE) Take 1 tablet by mouth at bedtime as needed sleep LOMOTIL 2.5-0.025 MG  TABS (DIPHENOXYLATE-ATROPINE) Take 1 tablet by mouth every 4 hours as  needed   Current Allergies (reviewed today): No known allergies  Vital Signs:  Patient profile:   78 year old female Menstrual status:  hysterectomy, partial Height:      60 inches (152.40 cm) Weight:      104 pounds (47.27 kg) BMI:     20.38 Temp:     98 .2 degrees F (36.78 degrees C) oral Pulse rate:   71 / minute BP sitting:   153 / 73  (right arm) Cuff size:   regular  Vitals Entered By: Jennet Maduro RN (June 19, 2010 2:33 PM) CC: follow-up visit Is Patient Diabetic? No Pain Assessment Patient in pain? no      Nutritional Status BMI of 19 -24 = normal Nutritional Status Detail appetite "good"  Have you ever been in a relationship where you felt threatened, hurt or afraid?No   Does patient need assistance? Functional Status Self care Ambulation Normal Comments no missed doses   Physical Exam  General:  alert and well-nourished.   Mouth:  good dentition, pharynx pink and moist, no erythema, and no exudates.   Lungs:  normal breath sounds.  no crackles and no wheezes.   Heart:  normal rate, regular rhythm, and no murmur.   Psych:  normally interactive, good eye contact, not anxious appearing, and not  depressed appearing.          Medication Adherence: 06/19/2010   Adherence to medications reviewed with patient. Counseling to provide adequate adherence provided                                Impression & Recommendations:  Problem # 1:  HIV INFECTION (ICD-042) It appears that her adherence is actually very good so I cannot explain her rising viral load.  A genotype resistance test in August revealed no significant mutations.  I will repeat a genotype today.  I doubt that her frequent bowel movements are contributing to significant malabsorption of her medications but I have suggested that she take one Imodium daily.  I will see her back in one month. Diagnostics Reviewed:  HIV: CDC-defined AIDS (08/30/2008)   CD4: 280 (06/06/2010)   CD4 %: 29.6  (11/04/2006) WBC: 3.4 (05/08/2010)   Hgb: 13.1 (05/08/2010)   HCT: 39.7 (05/08/2010)   Platelets: 131 (05/08/2010) HIV genotype: See Comment (05/13/2010)   HIV-1 RNA: 85400 (06/05/2010)   HBSAg: No (11/15/2006)  Orders: T-HIV Genotype (16109-60454) Est. Patient Level III (09811)  Patient Instructions: 1)  Please schedule a follow-up appointment in 1 month.         Medication Adherence: 06/19/2010   Adherence to medications reviewed with patient. Counseling to provide adequate adherence provided

## 2010-10-23 NOTE — Miscellaneous (Signed)
Summary: clinical update/ryan white NCADAP apprv til 12/20/10  Clinical Lists Changes  Observations: Added new observation of AIDSDAP: Yes 2011 (01/09/2010 14:41)

## 2010-10-23 NOTE — Assessment & Plan Note (Signed)
   Allergies: No Known Drug Allergies   Impression & Recommendations:  Problem # 1:  HIV INFECTION (ICD-042)  Orders: T-HIV Genotype ([id number])

## 2010-10-29 NOTE — Assessment & Plan Note (Signed)
   Vital Signs:  Patient profile:   77 year old female Menstrual status:  hysterectomy, partial Height:      58.5 inches (148.59 cm) Weight:      108.5 pounds (49.32 kg) BMI:     22.37 Temp:     97.8 degrees F oral Pulse rate:   76 / minute BP sitting:   134 / 74  (right arm) Is Patient Diabetic? No Pain Assessment Patient in pain? no      Nutritional Status BMI of 19 -24 = normal  Does patient need assistance? Functional Status Self care Ambulation Normal   Allergies: No Known Drug Allergies  Patient here for week 480 study visit. She denies any new problems and says she is taking her meds everyday. She does say that the diarrhea has eased some since she started taking 1 lomotil everyday. She will return in June for the next study appt.Deirdre Evener RN  October 23, 2010 10:36 AM   Other Orders: Est. Patient Research Study [id number]) T-Comprehensive Metabolic Panel [id number]) T-Hepatitis B Core Antibody [phone]) T-Hepatitis C Antibody [phone]) T-Hepatitis B Surface Antigen [id number]) T-Hepatitis A Antibody (59563-87564) T-Lipid Profile [id number]) T-Urine Protein [phone]) T-Urine Creatinine [phone])   Orders Added: 1)  Est. Patient Research Study [04200] 2)  T-Comprehensive Metabolic Panel [80053-22900] 3)  T-Hepatitis B Core Antibody [20254-27062] 4)  T-Hepatitis C Antibody [37628-31517] 5)  T-Hepatitis B Surface Antigen [61607-37106] 6)  T-Hepatitis A Antibody [26948-54627] 7)  T-Lipid Profile [80061-22930] 8)  T-Urine Protein [03500-93818] 9)  T-Urine Creatinine [82570-24070]

## 2010-11-12 NOTE — Miscellaneous (Signed)
Summary: HIV-1 RNA, CD4 (RESEARCH)  Clinical Lists Changes  Observations: Added new observation of CD4 COUNT: 438 microliters (10/23/2010 9:57) Added new observation of HIV1RNA QA: 39 copies/mL (10/23/2010 9:57)

## 2010-11-20 ENCOUNTER — Encounter (INDEPENDENT_AMBULATORY_CARE_PROVIDER_SITE_OTHER): Payer: Self-pay | Admitting: *Deleted

## 2010-11-27 NOTE — Miscellaneous (Signed)
Summary: adap UPDATE   Clinical Lists Changes  Observations: Added new observation of AIDSDAP: Pending-APPROVAL 2012  (11/20/2010 15:03) Added new observation of PCTFPL: 122.41  (11/20/2010 15:03) Added new observation of INCOMESOURCE: SSI  (11/20/2010 15:03) Added new observation of HOUSEINCOME: 16109  (11/20/2010 15:03) Added new observation of FINASSESSDT: 11/20/2010  (11/20/2010 15:03) Added new observation of YEARLYEXPEN: 0  (11/20/2010 15:03)

## 2010-12-04 ENCOUNTER — Encounter (INDEPENDENT_AMBULATORY_CARE_PROVIDER_SITE_OTHER): Payer: Self-pay | Admitting: *Deleted

## 2010-12-04 LAB — T-HELPER CELL (CD4) - (RCID CLINIC ONLY)
CD4 % Helper T Cell: 16 % — ABNORMAL LOW (ref 33–55)
CD4 T Cell Abs: 280 uL — ABNORMAL LOW (ref 400–2700)

## 2010-12-09 NOTE — Miscellaneous (Signed)
Summary: ADAP APPROVED TIL 06/21/11  Clinical Lists Changes  Observations: Added new observation of AIDSDAP: Yes 2012 (12/04/2010 17:50)

## 2011-02-06 NOTE — Procedures (Signed)
[place]. [facility]  Patient:    Alison Ruiz, Alison Ruiz                        MRN: [id number] Proc. Date: 03/28/01 Adm. Date:  [id number] Attending:  Alberteen Spindle, M.D.   Procedure Report  PROCEDURE:  Bone marrow biopsy and aspiration.  INDICATIONS:  Persistent leukopenia with B symptoms, rule out mild dysplasia of proliferative disease.  SUMMARY:  From right posterior iliac crest under 2% lidocaine local bone marrow biopsy and aspiration were performed with a disposable ______ needle. Good aspirate was obtained both for routine processing as well as cytogenetics and flow cytometry.  Biopsy was also obtained for routine processing.  There was minimal bleeding and no obvious complications.  Patient will remove her bandage tomorrow and can shower.  She will call [phone] for any problems or questions.  She will return to [facility] the [facility] results. DD:  03/28/01 TD:  03/28/01 Job: [id number]

## 2011-02-06 NOTE — Discharge Summary (Signed)
[place]. [facility]  Patient:    Alison Ruiz, Alison Ruiz Visit Number: [id number] MRN: [id number]          Service Type: MED Location: [id number] Attending Physician:  Phifer, Trinna Post Dictated by:   Lucille Passy, M.D. Admit Date:  06/19/2001 Discharge Date: 07/01/2001   CC:         Dewayne Shorter, M.D.  Velna Hatchet, M.D.   Discharge Summary  DATE OF BIRTH:  [birth year]  DISCHARGE DIAGNOSES: 1. ______-induced hepatic steatosis and lactic acidosis. 2. Human immunodeficiency virus positive. 3. Anemia. 4. Thrush.  CONSULTS:  Infectious disease, Dewayne Shorter, M.D.  DISCHARGE MEDICATIONS: 1. Bactrim Double Strength on p.o. q.d. 2. Riboflavin (vitamin B2) 100 mg p.o. b.i.d. 3. Zoloft 50 mg p.o. q.d. 4. Morphine Immediate Release 15 mg one p.o. q.4h. p.r.n. pain. 5. Multivitamins one p.o. q.d.  ACTIVITY:  As tolerated.  A home health care worker will visit Alison Ruiz three times a week to help her with her medications and to make sure she is still doing well as long as she needs.  FOLLOW-UP: 1. Velna Hatchet, M.D., at [facility] on Tuesday, July 05, 2001, at 9:45 a.m.  Her phone number is [phone]. 2. Dewayne Shorter, M.D., in the [place]. [facility].  The patient is to call for this    appointment.  His office number is [phone].  HISTORY OF PRESENT ILLNESS:  Alison Ruiz is a 77 year old African-American female who is HIV positive.  She presented to the [facility] ER on June 17, 2001, with a two-day history of abdominal pain. The pain was crampy in nature and initially was worse on her left side.  No nausea or vomiting was associate with this pain.  Of note, the patient is HIV positive and was recently enrolled in a clinical trial of Combivir and placebo versus Combivir, Trizivir, and Enavirenz.  It is unknown for sure which arm  of the study this patient was enrolled in.  ADMISSION LABORATORY DATA:  Sodium 134, potassium 3.7, chloride 99, CO2 25, BUN 16, creatinine 0.8, glucose 132, calcium 9.7.  White blood cell count 2.6, hemoglobin 11.6, MCV 99.9, platelets 198.  Lipase 36.  PT 13.1, INR 1.0, PTT 26.  A chest x-ray on admission showed no acute disease.  An abdominal x-ray on admission showed a normal gas pattern with a small left renal stone.  An EKG on admission showed left axis deviation and old septal myocardial infarction with slight concave ST elevation in lead V2, similar to an old EKG.  HOSPITAL COURSE: #1 - NRTI-INDUCED HEPATIC STEATOSIS AND LACTIC ACIDOSIS:  The patient had a CT scan of the abdomen and pelvis with IV and oral contrast of the abdomen and pelvis on hospital day #1, which showed no acute disease, no fluid collections, no adenopathy, no pelvic masses, and did not show significant renal nephrolithiasis on the left.  This study was compared to a study from March 25, 2001, and overall showed no change.  The patient had LFTs which showed AST 41, ALT 37, alkaline phosphatase 133, and total bilirubin 0.6.  A repeat lipase on hospital day #1 was again normal at 42.  Triglycerides were elevated at 220 and a GTT was elevated at 149.  A urinalysis showed 100 glucose, but was otherwise normal.  The patient did have a venous lactic acid level elevated at  3.8.  She was fecal occult blood negative during hospitalization. Blood cultures drawn on hospital day #1 are negative and blood cultures drawn on hospital day #6 are both negative at the time of dictation, but the final reading is negative.  As well, blood culture for acid-fast bacilli is negative at the time of dictation, drawn on hospital day #2.  An LDH was normal at 157. Serum ammonia levels were consistently normal during the hospitalization. Therefore, pain due to nephrolithiasis, urinary tract infection, lymphoma, gallbladder disease,  pancreatitis, and intestinal ischemia were ruled out by labs, radiologic studies, and a benign abdominal exam with only tenderness to palpation in the epigastric and right upper quadrant without distention or peritoneal signs.  However, the patients alkaline phosphatase and transaminases were elevated during admission, trending up at first to a maximum of alkaline phosphatase 991, AST 675, and ALT 659.  The patient had extreme abdominal pain during hospitalization controlled with IV morphine.  An infectious disease consult was obtained from Dewayne Shorter, M.D., who made the diagnosis of hepatic steatosis with lactic acidosis secondary to the patients antiretroviral medicines, most notably reverse transcriptase inhibitors.  As all other causes of her abdominal pain had been ruled out, it was felt that this was the most likely diagnosis.  In this disorder, reverse transcriptase inhibitors poison the liver mitochondria, causing the liver function tests to be elevated.  All of the patients HIV medicines were held at this time and the patient was started on nutritional supplements, thiamine, riboflavin, and a multivitamin, as studies have shown that these supplements can reverse the mitochondrial damage.  The patient was also started on the supplement L-carnitine, but a few days later broke out in a vesicular rash over her left mandibular region and trunk.  This rash was characterized by fluid-filled vesicles 2-3 mm in diameter with no surrounding edema or erythema.  This vesicles did cross the midline and were associated with only itching, no pain or burning, so shingles was thought to be extremely unlikely. Dewayne Shorter, M.D., felt that this rash was due to either diffuse viral infection versus a drug reaction to the L-carnitine was discontinued at that time.  During the course of hospitalization, the patients alkaline phosphatase and transaminase levels slowly resolved until on the day  of  discharge they were alkaline phosphatase 841, AST 81, and ALT 210.  The patients total bilirubin levels did increase from consistently normal to elevated at 1.7 on the day of discharge, but this was felt to be consistent with slow resolution course of her hepatic steatosis.  As well, the patients lactic acidosis level did resolve from 3.8 to normal at 2.0 eight days prior to discharge.  The patients abdominal pain slowly resolved during hospitalization and on the day prior to discharge the patients abdominal pain was well controlled with infrequent doses of p.o. immediate release morphine. The patient will be discharged on p.o. morphine, which controlled her pain very well at the time of discharge.  Also of note, the patient did become febrile on hospital day #8 with a T-max of 101.9 degrees.  Her white blood cell count had decreased from the admission level to 1.8 at that time.  The patients x-ray on that day was normal with no infiltrates and the patient also had a normal urinalysis on that day.  So this fever was felt to be most likely due to her hepatic inflammation versus a viral infection that may have caused her vesicular rash.  After that  day, the patient remained consistently afebrile during the rest of her hospitalization and her white blood cell count rebounded to 3.8 at the time of discharge.  The patients HIV regimen will need to be adjusted in the future with regard to her illness induced by her reverse transcriptase inhibitor.  #2 - HUMAN IMMUNODEFICIENCY VIRUS POSITIVE:  The patient is followed by Dewayne Shorter, M.D., in the [facility]. [facility].  Her absolute CD4 count on June 08, 2001, was 55.  As noted previously, the patient was enrolled in an HIV medication trial, and although the arm that the patient was enrolled in is not known for sure, she was likely enrolled in the active drug arm of the study because of her  drug-induced hepatic steatosis with lactic acidosis.  The patients HIV medicines were all held during hospitalization due to her illness, but they may be restarted at the discretion of Dewayne Shorter, M.D., on an outpatient basis.  #3 - ANEMIA:  The patient had macrocytic anemia on admission with a hemoglobin of 11.6 and an MCV at the far upper range of normal at 99.9.  Later during the hospitalization, her MCV climbed to 102.8, but with the hemoglobin at the lowest 8.5.  The patient did have negative stool guaiacs during admission.  On the day prior to discharge, her hemoglobin had improved to 9.8 with an MCV of 101.2.  This macrocytic anemia was thought to be most likely due to HIV medicines, which are known to cause macrocytic effect, but likely also has a component of anemia of chronic disease with regards to the patients severe HIV disease with low CD4 count.  #4 - THRUSH:  The patient had white plaques over her hard palate, soft palate, and tongue on admission.  She was treated with Nystatin Swish and Swallow and Mycelex troches during admission with excellent resolution of her thrush. These medicines will not be continued on an outpatient basis as the patients thrush has resolved, but she may need them in the future if her thrush recurs. At the time of discharge, the patient has been afebrile for greater than 24 hours.  Please note that the patient was ruled out for cardiac ischemia with cardiac enzymes negative x 2.  Pending labs at the time of dictation include final readings on blood cultures ordered on June 25, 2001.  All of these cultures are read as preliminary negative at the time of admission. Dictated by:   Lucille Passy, M.D. Attending Physician:  Phifer, Trinna Post DD:  07/02/01 TD:  07/02/01 Job: [id number]

## 2011-02-06 NOTE — Discharge Summary (Signed)
[place]. [facility]  Patient:    Alison Ruiz, Alison Ruiz                        MRN: [id number] Adm. Date:  [id number] Disc. Date: [id number] Attending:  Dorothyann Peng N                           Discharge Summary  CONSULTATIONS: 1. Neurology, Dr. Orlin Hilding. 2. Hematology, Dr. Catha Gosselin. 3. Vascular surgery, Dr. Madilyn Fireman. 4. ENT, Dr. Pollyann Kennedy.  HISTORY OF PRESENT ILLNESS:  Alison Ruiz is a 77 year old African-American female without significant past medical history.  She was brought in by her sister for evaluation of worsening confusion.  Her sister states that the symptoms of confusion and weight loss started about two months ago after having oral surgery.  She is currently being treated for thrush by her dentist.  Her sister stated that she lost about 12-15 pounds and is increasingly disoriented.  According to the sister, several days ago the patient was unable to turn off the lights because she had forgotten how to. The patients sister denied recent falls, urinary incontinence, headache, or gait impairment.  There is associated fever.  There are no complaints of sleep disorder.  There is increased thirst.  The patient says she has lost weight because she is unable to eat because of her oral surgery.  PAST MEDICAL HISTORY:  Denies significant past medical history.  PAST SURGICAL HISTORY:  Denied any surgical history.  However, after further questioning, she reports a hysterectomy 20-30 years ago.  MEDICATIONS:  Oral medications for her oral yeast infection.  ALLERGIES:  No known drug allergies.  FAMILY HISTORY:  Mother died of colon cancer.  Father died of an MI.  Nephew with diabetes.  There is no history of breast cancer in the family.  SOCIAL HISTORY:  She lives alone.  She is divorced.  She works at [employer].   She denies alcohol use , illicit drug use, and tobacco use. She claims to have independent ADLs.  REVIEW OF SYSTEMS:  Significant for 10- to 15-pound  weight loss.  Denies cough, nose bleeds, nausea, vomiting, bright red blood per rectum, and gait difficulties.  PHYSICAL EXAMINATION:  VITAL SIGNS:  Temperature 98.1, blood pressure 119/70, heart rate of 43, respiratory rate 20, pulse oximetry 97%.  GENERAL:  Thin female lying in bed, very pleasant.  HEENT:  Normocephalic, atraumatic.  She has a wig on in place.  Anicteric sclerae.  Pupils equally round and reactive to light.  Oral thrush was noted. Fair dentition.  NECK:  No JVD, no bruits.  Normal carotid upstroke.  No nuchal rigidity.  CHEST:  Clear anteriorly and posteriorly.  CARDIAC:  Bradycardia, without murmur.  ABDOMEN:  Flat, soft, nontender, with normal bowel sounds.  RECTAL:  The patient refused rectal exam.  GU, BREASTS, PELVIC:  Deferred.  EXTREMITIES:  No edema, although her right lower leg had diffuse hyperpigmentation, multiple venous varicosities, and her distal pulses were palpable.  No ulcerations were noted.  NEUROLOGIC:  Awake, alert, and oriented to self, place, and time, not the year.  She moves all extremities.  Had clear speech.  There was skin tenting. Mini-Mental exam was significant for score of 19.  LABORATORY DATA:  EKG significant for sinus bradycardia at 60 beats per minute, Qs in V1 and V3.  White count significant for 2.9, hemoglobin 9.0, with a normal differential. Blood cultures were  negative to date.  Chest x-ray negative.  Head CT showed no acute findings, with an incidental finding of an 8 mm meningioma lateral to the left temporal lobe, and it was calcified.  HOSPITAL COURSE:  She was admitted to the floor for altered mental status. Significant history included oral thrush and weight loss.  Differential diagnosis included metabolic disorder like hypothyroidism, B12, folate deficiency, encephalopathy.  Infectious etiology was considered because of her fever and leukopenia.  B12 and folate were ordered, as well as an RPR.  MRI  of the brain was ordered as well.  She was seen by care management for possible change in disposition in case the patient was unable to go home alone. Laboratories came back significant for normal TSH of 1.026.  UA was significant for many bacteria, so she was started on IV antibiotics because the patient had poor p.o. intake at the time.  She had a nonreactive RPR, normal B12 level.  She had a high sedimentation rate at 39, normal ferritin, normal electrolytes.  Venous Dopplers were ordered because of the multiple venous varicosities on the right lower extremity, and they were negative. Neurology was consulted for further evaluation of her confusion.  They agreed with the MRI of the brain, and they ordered an EEG.  The EEG was normal; awake and drowsy.  The patient continued to have a persistent fever between 100 degrees and 101 degrees.  Dr. Catha Gosselin was consulted of hematology because of her persistent leukopenia and her anemia.  He suggested to obtain a CT of the neck, chest, abdomen, and pelvis to rule out internal adenopathy, which came back negative for any abnormal findings.  They also suggested an ENT evaluation because of her pansinusitis noted on the MRI.  ENT was consulted, and they did not think that her fevers were attributed to her chronic sinusitis.  Because of her negative body CT scans, a bone marrow was then scheduled by Dr. Catha Gosselin on March 28, 2001.  The patient continued to have persistent leukopenia and persistent fevers; however, she was stable and no longer as confused as she was on admission.  She was discharged home on March 28, 2001, in an improved condition.  She was scheduled to see Dr. Catha Gosselin in the hematology clinic later that week to discuss her bone marrow results.  DISCHARGE MEDICATIONS: 1. Augmentin 875 b.i.d. for her sinusitis. 2. Zoloft 25 mg q.d. for depression.  Her confusion was also thought to be a    component of depression. 3. Multivitamin 1  daily.  FOLLOW-UP:  She was going to follow up with Dr. Catha Gosselin on April 01, 2001, and Dr. Allyne Gee in the following week.  DISCHARGE DIAGNOSES:  1. Persistent fever. 2. Chronic sinusitis. 3. Anemia. 4. Leukopenia. 5. Oral thrush. 6. Confusion.  DISCHARGE MEDICATIONS: 1. Augmentin 875 b.i.d. 2. Zoloft 25 q.d. 3. Mycelex troches. 4. Multivitamin 1 q.d.  DISPOSITION:  Discharged home with her sister.  CONDITION ON DISCHARGE:  Stable and improved. DD:  05/06/01 TD:  05/07/01 Job: [id number]

## 2011-02-06 NOTE — Consult Note (Signed)
[place]. [facility]  Patient:    Alison Ruiz, Alison Ruiz                        MRN: [id number] Proc. Date: 03/24/01 Adm. Date:  [id number] Attending:  Gwynneth Aliment CC:         Velna Hatchet, M.D.  [facility]   Consultation Report  SUMMARY:  The patient is a 77 year old female patient of Dr. Allyne Gee service seen secondary to mild leukopenia.  Past medical history positive for minimal medical problems in the past.  She has really been on no home medications.  She was originally admitted on June 30 for a confusion workup.  Over the prior two months, she was noted to have a decreasing weight of 12 to 15 pounds and intermittent thrush.  She had also had some intermittent fevers.  Laboratory studies on July 30 showed a sodium of 139, potassium 3.7, creatinine 0.5, BUN 15, albumin 3.1, total protein 6.1, GOT 29, GPT 23, alkaline phosphatase 55, total bilirubin 0.4.  White count 2.9, hemoglobin 9, platelet count 154,000 with MCV 87.9.  Her differential then showed 68% segmented neutrophils, 21% lymphocytes, 10% monocytes, and 2% eosinophils.  She had been on no home medications.  Blood cultures x 2 from June 30 have remained negative.  Serum iron level was 36, TIBC of 289, O2 saturation 12%, B12 419, RBC folate level 491, ferritin level 821.  TSH 1.03.  From July 1, white count 2.7, hemoglobin 10.8, RDW 15.7, platelet count 167,000 with neutrophil count 2000.  RPR from June 30 on her blood returned negative.  From July 3, white count 2.2, hemoglobin 10.7, RDW 14.9%, platelet count 179,000 with occult blood check on her stool negative.  Radiologic studies done this admission include an MRI of the brain with contrast on March 21, 2001, that showed pansinusitis with mucosal thickening, a small meningioma in the left temporal region.  Chest x-ray was negative from June 30.  She has been febrile each day she has been here and currently is 100.1  degrees.  PAST MEDICAL HISTORY:  Positive for partial hysterectomy many years ago.  ALLERGIES:  None.  SOCIAL HISTORY:  She is separated, no children, lives alone, works at a cleaners.  No alcohol or tobacco use.  FAMILY HISTORY:  Positive for cancer and diabetes.  REVIEW OF SYSTEMS:  Positive for B symptoms.  She has had recently some night sweats.  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  She is quite fatigued and tired.  I cannot really elicit more infections from last winter, however.  PHYSICAL EXAMINATION:  VITAL SIGNS:  Temperature 100.1 degrees, pulse 61, respirations 20, blood pressure 104/51.  O2 saturation 97% on room air.  GENERAL:  Overall, she looks chronically ill.  HEENT:  Oral mucosa shows what looks to be some changes of thrush.  NECK:  No cervical, supraclavicular, or axillary adenopathy.  ABDOMEN:  I do not feel any hepatosplenomegaly, no inguinal adenopathy.  ASSESSMENT:  This is a bothersome picture.  This patient has long-term exposure to solvents and a low white blood cell count in the face of fevers. Usually if patients have benign leukopenia, their white count will be low between infections, but when they get infected, their white cell count will go up in response to an infection stress.  However, the  patient is just not demonstrating that.  Her slow healing from her oral surgery also would indicate that her immune system has probably been down for quite some time.  PLAN:  I told the patient this really needs to be investigated a bit more. She is at risk for non-Hodgkins lymphoma, leukemia, and myelodysplastic syndrome due to her solvent exposure.  We will get a CT scan of the neck, chest, abdomen, and pelvis with contrast on March 25, 2001, to rule out internal adenopathy.  I would suggest an ENT consult be considered while she is here secondary to  her pansinusitis to see if her fevers might be due to that.  We will repeat her blood counts tomorrow, and I will review her blood smears.  We are getting a smear made from her blood count from July 3.  If there are no obvious answers in the above workup, then she will need a bone marrow exam with flow cytometry and cytogenetics done.  I have discussed this with her.  She is agreeable to the workup.  We will go ahead and get a serum erythropoietin level and a reticulocyte count tomorrow as well as metabolic, blood, and LDH to further look at this situation.  Thank you for allowing [place] to share in her care. DD:  03/24/01 TD:  03/24/01 Job: [id number]
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
## 2011-02-06 NOTE — Consult Note (Signed)
[place]. [facility]  Patient:    Alison Ruiz, Alison Ruiz                        MRN: [id number] Proc. Date: 03/25/01 Adm. Date:  [id number] Attending:  Gwynneth Aliment                          Consultation Report  TIME:  5 p.m., [facility]  REASON FOR CONSULTATION:  Fever of unknown origin, leukopenia, and possible sinus disease.  HISTORY:  This is a 77 year old lady who is admitted due to recent history of mental status changes.  She had not been eating an nourishing herself.  During the evaluation for the mental status change, an MRI scan of the brain was obtained and revealed pansinusitis.  She as then later found to have chronic leukopenia.  She has been evaluated by Dr. Catha Gosselin of hematology/oncology, and there was concern about possible lymphopoietic malignancy.  She denies any history of nasal obstruction, congestion, drainage, facial pain, pressure, or any allergies or sinus problems.  PAST MEDICAL HISTORY AND MEDICATIONS:  Outlined in the admission note.  PHYSICAL EXAMINATION:  GENERAL:  She is a pleasant, elderly lady lying supine in the hospital bed, quite comfortable.  HEAD AND NECK:  She is breathing easily through her nose without any obstruction or congestion.  She has no difficulty breathing.  There is no palpable adenopathy in the neck.  The oral cavity and oropharynx are clear except for small, punctate areas of exudate on the tongue consistent with Candidiasis.  Intranasal examination was completely clear and healthy without any evidence of polyps or exudate.  Ears are occluded with dried up skin and cerumen but no signs of any infection.  There is no tenderness or any external otitis.  LABORATORY DATA:  I reviewed the CT of the head that was done earlier today which included the sinuses.  There is some small amount of dependent fluid in both maxillary sinuses, but the sinuses are open and otherwise aerated without any signs  of obstruction.  There is a scant amount of anterior ethmoid thickening seen on the right side.  The frontals are poorly developed but are for the most part clear.  The remainder of the ethmoid sinuses are clear, and the sphenoid sinuses are clear as well.  IMPRESSION:  Radiographic evidence of mild sinus disease without any clinical correlation.  I doubt this is the source of the fever.  I do not think any other intervention with the sinuses will be necessary or appropriate.  She is currently on broad-spectrum antibiotic therapy which should clear whatever residual inflammation is left in the sinuses.  If any other issues should arise, I will be available for followup care. DD:  03/25/01 TD:  03/25/01 Job: [id number]

## 2011-03-05 ENCOUNTER — Ambulatory Visit: Payer: Self-pay | Admitting: *Deleted

## 2011-03-10 ENCOUNTER — Other Ambulatory Visit: Payer: Self-pay | Admitting: *Deleted

## 2011-03-10 DIAGNOSIS — E875 Hyperkalemia: Secondary | ICD-10-CM

## 2011-04-02 ENCOUNTER — Ambulatory Visit (INDEPENDENT_AMBULATORY_CARE_PROVIDER_SITE_OTHER): Payer: Medicare PPO | Admitting: *Deleted

## 2011-04-02 VITALS — BP 157/71 | HR 60 | Temp 97.6°F | Wt 101.8 lb

## 2011-04-02 DIAGNOSIS — B20 Human immunodeficiency virus [HIV] disease: Secondary | ICD-10-CM

## 2011-04-02 LAB — LIPID PANEL
Total CHOL/HDL Ratio: 2.4 Ratio
VLDL: 14 mg/dL (ref 0–40)

## 2011-04-02 LAB — CBC WITH DIFFERENTIAL/PLATELET
Lymphocytes Relative: 42 % (ref 12–46)
Lymphs Abs: 1.4 10*3/uL (ref 0.7–4.0)
Neutro Abs: 1.6 10*3/uL — ABNORMAL LOW (ref 1.7–7.7)
Neutrophils Relative %: 46 % (ref 43–77)
Platelets: 127 10*3/uL — ABNORMAL LOW (ref 150–400)
RBC: 3.63 MIL/uL — ABNORMAL LOW (ref 3.87–5.11)
WBC: 3.4 10*3/uL — ABNORMAL LOW (ref 4.0–10.5)

## 2011-04-02 LAB — COMPREHENSIVE METABOLIC PANEL
ALT: 19 U/L (ref 0–35)
AST: 25 U/L (ref 0–37)
Chloride: 106 mEq/L (ref 96–112)
Creat: 0.93 mg/dL (ref 0.50–1.10)
Sodium: 145 mEq/L (ref 135–145)
Total Bilirubin: 0.6 mg/dL (ref 0.3–1.2)
Total Protein: 7.4 g/dL (ref 6.0–8.3)

## 2011-04-02 NOTE — Progress Notes (Signed)
04/02/11 @ 0800: Pt here for research study A5001, visit 512. Assessment unchanged since last study visit. Pt adhering to ARV regimen. Pt denies any new findings/problems. Fasting labs were drawn. Pt received $20.00 gift card for study. Next research visit scheduled for Thursday, July 16, 2011 at 8 am. -- Tacey Heap RN

## 2011-04-03 ENCOUNTER — Encounter: Payer: Self-pay | Admitting: *Deleted

## 2011-04-03 NOTE — Progress Notes (Signed)
  Subjective:    Patient ID: Alison Ruiz, female    DOB: 06-Nov-1930, 77 y.o.   MRN: [id number]  HPI    Review of Systems Pt was here on 04/02/11 at 0800 for research visit. Pt denied any new problems and A&Ox3. Took VS with dynamap and pulse registered 37.   97.6 T, 60 P (manual) with a beat of 1 strong pulse followed by 2-3 weak pulses, with 2 pauses in a 60 second time. 157/71. Pt stated she had been drinking water and trying to stay out of the sun. Her brother passed away recently and she has been dealing with this as well as cleaning his house.  Potassium 5.5 on labs from research visit on 04/02/11. Discussed findings with Traci Sermon NP. Orders are to instruct patient to drink gatorade this weekend and come in on Monday 04/06/11 to recheck potassium and heart rhythm. If irregular then possible 12 lead EKG.   Have called patient at [phone] and left a message. Will call again this afternoon.    Objective:   Physical Exam        Assessment & Plan:

## 2011-04-06 ENCOUNTER — Other Ambulatory Visit: Payer: Self-pay | Admitting: Licensed Clinical Social Worker

## 2011-04-06 DIAGNOSIS — G4700 Insomnia, unspecified: Secondary | ICD-10-CM

## 2011-04-06 DIAGNOSIS — R197 Diarrhea, unspecified: Secondary | ICD-10-CM

## 2011-04-06 MED ORDER — DIPHENOXYLATE-ATROPINE 2.5-0.025 MG PO TABS: 1.0000 | ORAL_TABLET | Freq: Four times a day (QID) | ORAL | Status: DC | PRN

## 2011-04-06 MED ORDER — ZOLPIDEM TARTRATE 5 MG PO TABS: 5.0000 mg | ORAL_TABLET | Freq: Every evening | ORAL | Status: DC | PRN

## 2011-04-07 ENCOUNTER — Other Ambulatory Visit: Payer: Self-pay | Admitting: *Deleted

## 2011-04-07 DIAGNOSIS — R197 Diarrhea, unspecified: Secondary | ICD-10-CM

## 2011-04-07 DIAGNOSIS — G4700 Insomnia, unspecified: Secondary | ICD-10-CM

## 2011-04-07 MED ORDER — ZOLPIDEM TARTRATE 5 MG PO TABS: 5.0000 mg | ORAL_TABLET | Freq: Every evening | ORAL | Status: DC | PRN

## 2011-04-07 MED ORDER — DIPHENOXYLATE-ATROPINE 2.5-0.025 MG PO TABS: 1.0000 | ORAL_TABLET | Freq: Four times a day (QID) | ORAL | Status: DC | PRN

## 2011-04-13 ENCOUNTER — Other Ambulatory Visit: Payer: Medicare PPO

## 2011-04-13 ENCOUNTER — Encounter: Payer: Self-pay | Admitting: *Deleted

## 2011-04-13 DIAGNOSIS — E875 Hyperkalemia: Secondary | ICD-10-CM

## 2011-04-13 NOTE — Progress Notes (Signed)
Patient came by today to have repeat BMET. She says she has been feeling fine and hasn't had any problems.

## 2011-04-14 ENCOUNTER — Other Ambulatory Visit: Payer: Self-pay | Admitting: *Deleted

## 2011-04-14 NOTE — Telephone Encounter (Signed)
Prior authorization completed and signed by Dr. Orvan Falconer.  Faxed to [facility].  [facility] notified that prior authorization completed.  Jennet Maduro, RN

## 2011-04-16 ENCOUNTER — Encounter: Payer: Self-pay | Admitting: *Deleted

## 2011-04-16 NOTE — Progress Notes (Signed)
Repeat BMET from 04/13/11 shows potassium of 3.9 (WNL). Will not evaluate further

## 2011-04-20 ENCOUNTER — Telehealth: Payer: Self-pay | Admitting: *Deleted

## 2011-04-20 NOTE — Telephone Encounter (Signed)
Pt approved for Lomotil from [facility].  Fax received from [facility]. Jennet Maduro, RN

## 2011-04-23 ENCOUNTER — Ambulatory Visit: Payer: Medicare PPO

## 2011-05-07 ENCOUNTER — Ambulatory Visit (INDEPENDENT_AMBULATORY_CARE_PROVIDER_SITE_OTHER): Payer: Medicare PPO | Admitting: Internal Medicine

## 2011-05-07 ENCOUNTER — Encounter: Payer: Self-pay | Admitting: Internal Medicine

## 2011-05-07 ENCOUNTER — Ambulatory Visit (HOSPITAL_COMMUNITY)
Admission: RE | Admit: 2011-05-07 | Discharge: 2011-05-07 | Disposition: A | Discharge status: Home / Self Care | Payer: Medicare Other | Source: Ambulatory Visit | Attending: Internal Medicine | Admitting: Internal Medicine

## 2011-05-07 VITALS — Temp 97.8°F | Ht 60.0 in | Wt 107.2 lb

## 2011-05-07 DIAGNOSIS — I499 Cardiac arrhythmia, unspecified: Secondary | ICD-10-CM | POA: Insufficient documentation

## 2011-05-07 DIAGNOSIS — Z23 Encounter for immunization: Secondary | ICD-10-CM

## 2011-05-07 DIAGNOSIS — Z9189 Other specified personal risk factors, not elsewhere classified: Secondary | ICD-10-CM

## 2011-05-07 DIAGNOSIS — F411 Generalized anxiety disorder: Secondary | ICD-10-CM

## 2011-05-07 DIAGNOSIS — B20 Human immunodeficiency virus [HIV] disease: Secondary | ICD-10-CM

## 2011-05-07 DIAGNOSIS — R9431 Abnormal electrocardiogram [ECG] [EKG]: Secondary | ICD-10-CM | POA: Insufficient documentation

## 2011-05-07 NOTE — Assessment & Plan Note (Signed)
She may continue to take low-dose Ambien sparingly for her chronic insomnia.

## 2011-05-07 NOTE — Progress Notes (Signed)
  Subjective:    Patient ID: Alison Ruiz, female    DOB: 1930-12-09, 77 y.o.   MRN: [id number]  HPI Alison Ruiz is in for her routine visit. She has been under some stress recently as she and her sister settled her recently deceased brothers does estate, cleaned out his house and prepared for sale. It is now on the market and she feels better about that. This recent stress has not caused her any difficulty in taking her medications and she does not believe she is missed any doses. She states that she only rarely needs to take lorazepam for anxiety and only rarely takes Ambien for her chronic insomnia. She has been feeling well.    Review of Systems  Constitutional: Negative for activity change, appetite change and unexpected weight change.  Respiratory: Negative for chest tightness and shortness of breath.   Cardiovascular: Negative for chest pain and palpitations.       Objective:   Physical Exam  Constitutional: She appears well-developed and well-nourished. No distress.  HENT:  Mouth/Throat: Oropharynx is clear and moist. No oropharyngeal exudate.  Eyes: Conjunctivae are normal.  Cardiovascular:  No murmur heard.      Irregular rhythm  Pulmonary/Chest: Effort normal and breath sounds normal. She has no wheezes. She has no rales.  Psychiatric: She has a normal mood and affect.          Assessment & Plan:  Her infection remains under reasonably good control with her current regimen and her adherence is excellent. I will not make any changes today.

## 2011-05-07 NOTE — Assessment & Plan Note (Addendum)
Her infection remains under reasonably good control with her current regimen and her adherence is excellent. I will not make any changes today.

## 2011-05-07 NOTE — Assessment & Plan Note (Signed)
She may continue to take lorazepam sparingly, as needed for her social anxiety.

## 2011-06-03 ENCOUNTER — Other Ambulatory Visit: Payer: Self-pay | Admitting: *Deleted

## 2011-06-03 DIAGNOSIS — B20 Human immunodeficiency virus [HIV] disease: Secondary | ICD-10-CM

## 2011-06-03 MED ORDER — DARUNAVIR ETHANOLATE 600 MG PO TABS: 600.0000 mg | ORAL_TABLET | Freq: Two times a day (BID) | ORAL | Status: DC

## 2011-06-03 MED ORDER — ETRAVIRINE 100 MG PO TABS: 200.0000 mg | ORAL_TABLET | Freq: Two times a day (BID) | ORAL | Status: DC

## 2011-06-03 MED ORDER — RITONAVIR 100 MG PO TABS: 100.0000 mg | ORAL_TABLET | Freq: Every day | ORAL | Status: DC

## 2011-06-03 MED ORDER — LAMIVUDINE-ZIDOVUDINE 150-300 MG PO TABS: 1.0000 | ORAL_TABLET | Freq: Every day | ORAL | Status: DC

## 2011-07-16 ENCOUNTER — Ambulatory Visit (INDEPENDENT_AMBULATORY_CARE_PROVIDER_SITE_OTHER): Payer: Medicare PPO | Admitting: *Deleted

## 2011-07-16 ENCOUNTER — Other Ambulatory Visit: Payer: Self-pay | Admitting: *Deleted

## 2011-07-16 ENCOUNTER — Encounter: Payer: Self-pay | Admitting: *Deleted

## 2011-07-16 VITALS — BP 132/84 | HR 52 | Temp 97.6°F | Wt 104.0 lb

## 2011-07-16 DIAGNOSIS — F419 Anxiety disorder, unspecified: Secondary | ICD-10-CM

## 2011-07-16 DIAGNOSIS — B20 Human immunodeficiency virus [HIV] disease: Secondary | ICD-10-CM

## 2011-07-16 LAB — COMPREHENSIVE METABOLIC PANEL
ALT: 20 U/L (ref 0–35)
AST: 23 U/L (ref 0–37)
Albumin: 4.2 g/dL (ref 3.5–5.2)
Alkaline Phosphatase: 89 U/L (ref 39–117)
Calcium: 9.4 mg/dL (ref 8.4–10.5)
Chloride: 107 mEq/L (ref 96–112)
Potassium: 4.2 mEq/L (ref 3.5–5.3)
Sodium: 140 mEq/L (ref 135–145)
Total Protein: 7.3 g/dL (ref 6.0–8.3)

## 2011-07-16 LAB — LIPID PANEL: LDL Cholesterol: 110 mg/dL — ABNORMAL HIGH (ref 0–99)

## 2011-07-16 LAB — PROTEIN, URINE, RANDOM: Total Protein, Urine: 5 mg/dL

## 2011-07-16 LAB — CREATININE, URINE, RANDOM: Creatinine, Urine: 84.9 mg/dL

## 2011-07-16 MED ORDER — LORAZEPAM 0.5 MG PO TABS: 0.5000 mg | ORAL_TABLET | Freq: Three times a day (TID) | ORAL | Status: DC | PRN

## 2011-07-16 NOTE — Progress Notes (Signed)
Alison Ruiz was here for the week 528 ALLRT study visit. She denies any problems currently except for insomnia and occasional diarrhea. She said that she needed her ativan refilled, so that was taken care of. She will return in January for the next visit and then see Dr. Orvan Falconer after that.

## 2011-08-06 ENCOUNTER — Other Ambulatory Visit: Payer: Self-pay | Admitting: *Deleted

## 2011-08-06 DIAGNOSIS — B20 Human immunodeficiency virus [HIV] disease: Secondary | ICD-10-CM

## 2011-08-06 MED ORDER — LAMIVUDINE-ZIDOVUDINE 150-300 MG PO TABS: 1.0000 | ORAL_TABLET | Freq: Every day | ORAL | Status: DC

## 2011-08-12 ENCOUNTER — Encounter: Payer: Self-pay | Admitting: Internal Medicine

## 2011-08-12 LAB — CD4/CD8 (T-HELPER/T-SUPPRESSOR CELL)
CD4%: 26.3
CD4: 421

## 2011-09-10 ENCOUNTER — Other Ambulatory Visit: Payer: Self-pay | Admitting: *Deleted

## 2011-09-10 DIAGNOSIS — R197 Diarrhea, unspecified: Secondary | ICD-10-CM

## 2011-09-10 MED ORDER — DIPHENOXYLATE-ATROPINE 2.5-0.025 MG PO TABS: 1.0000 | ORAL_TABLET | Freq: Four times a day (QID) | ORAL | Status: DC | PRN

## 2011-10-06 ENCOUNTER — Other Ambulatory Visit: Payer: Self-pay | Admitting: *Deleted

## 2011-10-06 DIAGNOSIS — B20 Human immunodeficiency virus [HIV] disease: Secondary | ICD-10-CM

## 2011-10-06 MED ORDER — DARUNAVIR ETHANOLATE 600 MG PO TABS: 600.0000 mg | ORAL_TABLET | Freq: Two times a day (BID) | ORAL | Status: DC

## 2011-10-06 MED ORDER — LAMIVUDINE-ZIDOVUDINE 150-300 MG PO TABS: 1.0000 | ORAL_TABLET | Freq: Every day | ORAL | Status: DC

## 2011-10-06 MED ORDER — RITONAVIR 100 MG PO TABS: 100.0000 mg | ORAL_TABLET | Freq: Every day | ORAL | Status: DC

## 2011-10-06 MED ORDER — ETRAVIRINE 100 MG PO TABS: 200.0000 mg | ORAL_TABLET | Freq: Two times a day (BID) | ORAL | Status: DC

## 2011-10-06 NOTE — Telephone Encounter (Signed)
I left her a message asking her to call & make an appt

## 2011-10-15 ENCOUNTER — Ambulatory Visit (INDEPENDENT_AMBULATORY_CARE_PROVIDER_SITE_OTHER): Payer: Medicare Other | Admitting: *Deleted

## 2011-10-15 ENCOUNTER — Encounter: Payer: Self-pay | Admitting: *Deleted

## 2011-10-15 VITALS — BP 145/72 | HR 49 | Temp 97.7°F | Wt 105.8 lb

## 2011-10-15 DIAGNOSIS — B20 Human immunodeficiency virus [HIV] disease: Secondary | ICD-10-CM

## 2011-10-15 DIAGNOSIS — E785 Hyperlipidemia, unspecified: Secondary | ICD-10-CM

## 2011-10-15 NOTE — Progress Notes (Signed)
Patient here for week 544 Allrt study visit. She denies any new current problems. She did say she had about 3 weeks of mild cold sx in December but not enough of a problem to go to the doctor for. She is going to make an appt to see Dr. Orvan Falconer soon for followup.

## 2011-10-16 LAB — LIPID PANEL
Cholesterol: 227 mg/dL — ABNORMAL HIGH (ref 0–200)
HDL: 90 mg/dL (ref >39)
Triglycerides: 67 mg/dL (ref <150)

## 2011-10-16 LAB — COMPREHENSIVE METABOLIC PANEL
Albumin: 4.2 g/dL (ref 3.5–5.2)
BUN: 20 mg/dL (ref 6–23)
Calcium: 9.2 mg/dL (ref 8.4–10.5)
Chloride: 104 mEq/L (ref 96–112)
Glucose, Bld: 71 mg/dL (ref 70–99)
Potassium: 4.2 mEq/L (ref 3.5–5.3)

## 2011-10-22 ENCOUNTER — Ambulatory Visit: Payer: Medicare Other

## 2011-10-30 ENCOUNTER — Encounter: Payer: Self-pay | Admitting: Internal Medicine

## 2011-10-30 LAB — HIV-1 RNA QUANT-NO REFLEX-BLD: HIV-1 RNA Viral Load: <40

## 2011-10-30 LAB — CD4/CD8 (T-HELPER/T-SUPPRESSOR CELL): CD8 % Suppressor T Cell: 51.1

## 2011-11-04 ENCOUNTER — Other Ambulatory Visit: Payer: Self-pay | Admitting: Internal Medicine

## 2011-11-04 DIAGNOSIS — B20 Human immunodeficiency virus [HIV] disease: Secondary | ICD-10-CM

## 2011-11-12 ENCOUNTER — Encounter: Payer: Self-pay | Admitting: Internal Medicine

## 2011-11-12 ENCOUNTER — Ambulatory Visit: Payer: Medicare Other

## 2011-11-12 ENCOUNTER — Ambulatory Visit (INDEPENDENT_AMBULATORY_CARE_PROVIDER_SITE_OTHER): Payer: Medicare Other | Admitting: Internal Medicine

## 2011-11-12 DIAGNOSIS — E785 Hyperlipidemia, unspecified: Secondary | ICD-10-CM

## 2011-11-12 NOTE — Progress Notes (Signed)
Patient ID: Alison Ruiz, female   DOB: 19-Feb-1931, 78 y.o.   MRN: [id number]  INFECTIOUS DISEASE PROGRESS NOTE    Subjective: Alison Ruiz is in for her routine visit. She denies missing any of her medications since her last visit. She still takes Ambien to 3 times per month to help her sleep. She states that she rarely needs Ativan for her anxiety and has not used it in the past month. She still uses Lomotil on a daily basis to counteract her chronic diarrhea.  Objective: Temp: 97.8 F (36.6 C) (02/21 0902) Temp src: Oral (02/21 0902) BP: 162/74 mmHg (02/21 0902) Pulse Rate: 62  (02/21 0902)  General: She is healthy appearing and in no distress Skin: No rash Lungs: Clear Cor: Slow regular S1 and S2 no murmurs Abdomen: Nontender   Lab Results HIV 1 RNA Quant (copies/mL)  Date Value  10/23/2010 39   07/24/2010 674*  06/05/2010 85400*     HIV-1 RNA Viral Load (no units)  Date Value  10/15/2011 <40   07/16/2011 <40      CD4 T Cell Abs (cmm)  Date Value  07/24/2010 340*  06/05/2010 280*  05/08/2010 260 BLOOD/ANTICOAG RATIO IN TUBE UNSATISFACTORY*     Assessment: Her HIV infection remains under excellent control. I will continue her current regimen.  I reviewed past EKGs in she has had chronic sinus arrhythmia. An echocardiogram done in 2003 showed a normal ejection fraction and no significant wall motion abnormalities or other defects. I will not pursue any further workup at this time.  Her blood pressure was initially elevated today but was normal on repeat. Her LDL cholesterol remains mildly elevated. I have given her written information about a low-fat diet for her review between now and her next visit.  Plan: 1. Continue current medications 2. Consider dietary modification 3. Return to clinic in 3 months   Cliffton Asters, MD [facility] for Infectious Diseases [facility] [phone] pager   [phone] cell 11/12/2011, 9:19 AM

## 2012-01-28 ENCOUNTER — Ambulatory Visit (INDEPENDENT_AMBULATORY_CARE_PROVIDER_SITE_OTHER): Payer: Medicare Other | Admitting: *Deleted

## 2012-01-28 VITALS — BP 151/81 | HR 54 | Temp 97.5°F | Wt 104.0 lb

## 2012-01-28 DIAGNOSIS — B20 Human immunodeficiency virus [HIV] disease: Secondary | ICD-10-CM

## 2012-01-28 DIAGNOSIS — E785 Hyperlipidemia, unspecified: Secondary | ICD-10-CM

## 2012-01-28 LAB — LIPID PANEL
Cholesterol: 218 mg/dL — ABNORMAL HIGH (ref 0–200)
HDL: 89 mg/dL (ref >39)
LDL Cholesterol: 118 mg/dL — ABNORMAL HIGH (ref 0–99)
Total CHOL/HDL Ratio: 2.4 Ratio
Triglycerides: 56 mg/dL (ref <150)
VLDL: 11 mg/dL (ref 0–40)

## 2012-01-28 LAB — CBC WITH DIFFERENTIAL/PLATELET
Basophils Relative: 0 % (ref 0–1)
Eosinophils Absolute: 0.1 10*3/uL (ref 0.0–0.7)
Eosinophils Relative: 1 % (ref 0–5)
Hemoglobin: 12.8 g/dL (ref 12.0–15.0)
Lymphs Abs: 1.9 10*3/uL (ref 0.7–4.0)
MCH: 32.7 pg (ref 26.0–34.0)
MCHC: 31.8 g/dL (ref 30.0–36.0)
MCV: 102.8 fL — ABNORMAL HIGH (ref 78.0–100.0)
Monocytes Relative: 10 % (ref 3–12)
Neutrophils Relative %: 45 % (ref 43–77)
Platelets: 156 10*3/uL (ref 150–400)
RBC: 3.91 MIL/uL (ref 3.87–5.11)

## 2012-01-28 LAB — COMPREHENSIVE METABOLIC PANEL
Alkaline Phosphatase: 94 U/L (ref 39–117)
BUN: 25 mg/dL — ABNORMAL HIGH (ref 6–23)
CO2: 25 mEq/L (ref 19–32)
Creat: 0.86 mg/dL (ref 0.50–1.10)
Glucose, Bld: 78 mg/dL (ref 70–99)
Total Bilirubin: 0.4 mg/dL (ref 0.3–1.2)

## 2012-01-28 NOTE — Progress Notes (Signed)
Patient here for week 560 ALLRT study visit. She will have one more visit on this study and can then be enrolled on another long term study for HIV and Aging that schedules visits every 24 weeks and collects labs every 48 weeks. She denies any new problems and has been very adherent with her meds. She is still working full-time. She has an appt with Dr. Orvan Falconer in the next month or so.

## 2012-02-03 ENCOUNTER — Other Ambulatory Visit: Payer: Self-pay | Admitting: Internal Medicine

## 2012-02-03 ENCOUNTER — Other Ambulatory Visit: Payer: Self-pay | Admitting: *Deleted

## 2012-02-03 DIAGNOSIS — B20 Human immunodeficiency virus [HIV] disease: Secondary | ICD-10-CM

## 2012-02-03 MED ORDER — RITONAVIR 100 MG PO TABS: 100.0000 mg | ORAL_TABLET | Freq: Two times a day (BID) | ORAL | Status: DC

## 2012-02-03 MED ORDER — LAMIVUDINE-ZIDOVUDINE 150-300 MG PO TABS: 1.0000 | ORAL_TABLET | Freq: Two times a day (BID) | ORAL | Status: DC

## 2012-02-03 MED ORDER — DARUNAVIR ETHANOLATE 600 MG PO TABS: 600.0000 mg | ORAL_TABLET | Freq: Two times a day (BID) | ORAL | Status: DC

## 2012-02-03 MED ORDER — ETRAVIRINE 100 MG PO TABS: 200.0000 mg | ORAL_TABLET | Freq: Two times a day (BID) | ORAL | Status: DC

## 2012-02-10 DIAGNOSIS — I498 Other specified cardiac arrhythmias: Secondary | ICD-10-CM | POA: Insufficient documentation

## 2012-02-10 NOTE — Progress Notes (Signed)
[facility]  Patient Active Problem List  Diagnoses  . HIV INFECTION  . ANXIETY  . GRIEF REACTION, ACUTE  . DEPRESSION  . VENOUS INSUFFICIENCY  . DENTAL CARIES  . DIARRHEA  . HEPATITIS B, HX OF  . SYNCOPE, HX OF  . Arrhythmia  . Dyslipidemia    Patient's Medications  New Prescriptions   No medications on file  Previous Medications   DARUNAVIR (PREZISTA) 600 MG TABLET    Take 1 tablet (600 mg total) by mouth 2 (two) times daily.   DIPHENOXYLATE-ATROPINE (LOMOTIL) 2.5-0.025 MG PER TABLET    Take 1 tablet by mouth 4 (four) times daily as needed.   ETRAVIRINE (INTELENCE) 100 MG TABLET    Take 2 tablets (200 mg total) by mouth 2 (two) times daily with a meal.   INTELENCE 100 MG TABLET    TAKE 2 TABLETS BY MOUTH TWICE DAILY WITH A MEAL   LAMIVUDINE-ZIDOVUDINE (COMBIVIR) 150-300 MG PER TABLET    Take 1 tablet by mouth 2 (two) times daily.   LAMIVUDINE-ZIDOVUDINE (COMBIVIR) 150-300 MG PER TABLET    TAKE 1 TABLET BY MOUTH DAILY   LORAZEPAM (ATIVAN) 0.5 MG TABLET    Take 1 tablet (0.5 mg total) by mouth 3 (three) times daily as needed.   NORVIR 100 MG TABS    TAKE 1 TABLET BY MOUTH DAILY WITH BREAKFAST   PREZISTA 600 MG TABLET    TAKE 1 TABLET BY MOUTH TWICE DAILY WITH A MEAL   RITONAVIR (NORVIR) 100 MG TABS    Take 1 tablet (100 mg total) by mouth 2 (two) times daily.   ZOLPIDEM (AMBIEN) 5 MG TABLET    Take 1 tablet (5 mg total) by mouth at bedtime as needed.  Modified Medications   No medications on file  Discontinued Medications   No medications on file    Subjective: Alison Ruiz is in for her routine visit.  As usual she never misses a single dose of her medications. She is doing well without any current problems or concerns.  Objective:    General: She is in good spirits as usual and appears well Skin: No rash Lungs: Clear Cor: Slow regular S1 and S2 with no murmurs or gallops Abdomen: Soft and nontender  Lab Results HIV 1 RNA Quant  (copies/mL)  Date Value  10/23/2010 39   07/24/2010 674*  06/05/2010 85400*     HIV-1 RNA Viral Load (no units)  Date Value  10/15/2011 <40   07/16/2011 <40      CD4 T Cell Abs (cmm)  Date Value  07/24/2010 340*  06/05/2010 280*  05/08/2010 260 BLOOD/ANTICOAG RATIO IN TUBE UNSATISFACTORY*     Assessment: Her HIV infection remains under very good control. Her last CD4 count in January was a little over 400 and her viral load was undetectable at less than 40. I will continue her current antiretroviral regimen.  Her blood pressure is slightly above goal today but has been normal on previous visits. I will simply monitor this at this time.  Her LDL cholesterol is up slightly to 118. I talked to her about lifestyle modification and she would like to try some dietary modifications before considering statin therapy.  She has some sort of sinus arrhythmia by a previous EKG but is asymptomatic. I have placed in a curbside consult request to cardiology and am waiting to her back.  Plan: 1. Continue current medications. 2. Try a low-fat diet 3. Monitor blood pressure 4.  Followup in 6 months   Cliffton Asters, MD [facility] for Infectious Disease [facility] [phone] pager   [phone] cell 02/10/2012, 2:06 PM

## 2012-02-10 NOTE — Progress Notes (Deleted)
Patient ID: Alison Ruiz, female   DOB: 16-Jan-1931, 78 y.o.   MRN: [id number]  INFECTIOUS DISEASE PROGRESS NOTE    Patient Active Problem List  Diagnoses  . HIV INFECTION  . ANXIETY  . GRIEF REACTION, ACUTE  . DEPRESSION  . VENOUS INSUFFICIENCY  . DENTAL CARIES  . DIARRHEA  . HEPATITIS B, HX OF  . SYNCOPE, HX OF  . Arrhythmia  . Dyslipidemia    Patient's Medications  New Prescriptions   No medications on file  Previous Medications   DARUNAVIR (PREZISTA) 600 MG TABLET    Take 1 tablet (600 mg total) by mouth 2 (two) times daily.   DIPHENOXYLATE-ATROPINE (LOMOTIL) 2.5-0.025 MG PER TABLET    Take 1 tablet by mouth 4 (four) times daily as needed.   ETRAVIRINE (INTELENCE) 100 MG TABLET    Take 2 tablets (200 mg total) by mouth 2 (two) times daily with a meal.   INTELENCE 100 MG TABLET    TAKE 2 TABLETS BY MOUTH TWICE DAILY WITH A MEAL   LAMIVUDINE-ZIDOVUDINE (COMBIVIR) 150-300 MG PER TABLET    Take 1 tablet by mouth 2 (two) times daily.   LAMIVUDINE-ZIDOVUDINE (COMBIVIR) 150-300 MG PER TABLET    TAKE 1 TABLET BY MOUTH DAILY   LORAZEPAM (ATIVAN) 0.5 MG TABLET    Take 1 tablet (0.5 mg total) by mouth 3 (three) times daily as needed.   NORVIR 100 MG TABS    TAKE 1 TABLET BY MOUTH DAILY WITH BREAKFAST   PREZISTA 600 MG TABLET    TAKE 1 TABLET BY MOUTH TWICE DAILY WITH A MEAL   RITONAVIR (NORVIR) 100 MG TABS    Take 1 tablet (100 mg total) by mouth 2 (two) times daily.   ZOLPIDEM (AMBIEN) 5 MG TABLET    Take 1 tablet (5 mg total) by mouth at bedtime as needed.  Modified Medications   No medications on file  Discontinued Medications   No medications on file    Subjective: ***  Objective:    General: *** Skin: *** Lungs: *** Cor: *** Abdomen: *** ***  Lab Results HIV 1 RNA Quant (copies/mL)  Date Value  10/23/2010 39   07/24/2010 674*  06/05/2010 85400*     HIV-1 RNA Viral Load (no units)  Date Value  10/15/2011 <40   07/16/2011 <40      CD4 T Cell Abs (cmm)  Date  Value  07/24/2010 340*  06/05/2010 280*  05/08/2010 260 BLOOD/ANTICOAG RATIO IN TUBE UNSATISFACTORY*     Assessment: ***  Plan: 1. ***   Cliffton Asters, MD [facility] [phone] pager   [phone] cell 02/10/2012, 10:29 AM

## 2012-02-11 ENCOUNTER — Other Ambulatory Visit: Payer: Self-pay | Admitting: *Deleted

## 2012-02-11 ENCOUNTER — Encounter: Payer: Self-pay | Admitting: Internal Medicine

## 2012-02-11 ENCOUNTER — Ambulatory Visit (INDEPENDENT_AMBULATORY_CARE_PROVIDER_SITE_OTHER): Payer: Medicare Other | Admitting: Internal Medicine

## 2012-02-11 VITALS — BP 169/78 | HR 56 | Temp 98.0°F | Ht 60.0 in | Wt 102.2 lb

## 2012-02-11 DIAGNOSIS — R197 Diarrhea, unspecified: Secondary | ICD-10-CM

## 2012-02-11 DIAGNOSIS — E785 Hyperlipidemia, unspecified: Secondary | ICD-10-CM

## 2012-02-11 DIAGNOSIS — I498 Other specified cardiac arrhythmias: Secondary | ICD-10-CM

## 2012-02-11 MED ORDER — DIPHENOXYLATE-ATROPINE 2.5-0.025 MG PO TABS: 1.0000 | ORAL_TABLET | Freq: Four times a day (QID) | ORAL | Status: DC | PRN

## 2012-03-11 ENCOUNTER — Telehealth: Payer: Self-pay | Admitting: *Deleted

## 2012-03-11 NOTE — Telephone Encounter (Signed)
Call from [facility] with question on combivir, sent in as 2 x daily, pt. Had previously been receiving it for 1 x daily. Please advise Wendall Mola CMA

## 2012-03-14 ENCOUNTER — Other Ambulatory Visit: Payer: Self-pay | Admitting: *Deleted

## 2012-03-14 DIAGNOSIS — B20 Human immunodeficiency virus [HIV] disease: Secondary | ICD-10-CM

## 2012-03-14 MED ORDER — LAMIVUDINE-ZIDOVUDINE 150-300 MG PO TABS: 1.0000 | ORAL_TABLET | Freq: Two times a day (BID) | ORAL | Status: DC

## 2012-03-14 NOTE — Telephone Encounter (Signed)
She should be taking Combivir one tablet 2 times daily.

## 2012-04-28 ENCOUNTER — Ambulatory Visit (INDEPENDENT_AMBULATORY_CARE_PROVIDER_SITE_OTHER): Payer: Self-pay | Admitting: *Deleted

## 2012-04-28 ENCOUNTER — Other Ambulatory Visit: Payer: Self-pay | Admitting: *Deleted

## 2012-04-28 VITALS — BP 143/74 | HR 47 | Temp 97.8°F | Ht 59.0 in | Wt 98.5 lb

## 2012-04-28 DIAGNOSIS — G4700 Insomnia, unspecified: Secondary | ICD-10-CM

## 2012-04-28 DIAGNOSIS — B20 Human immunodeficiency virus [HIV] disease: Secondary | ICD-10-CM

## 2012-04-28 DIAGNOSIS — E785 Hyperlipidemia, unspecified: Secondary | ICD-10-CM

## 2012-04-28 LAB — LIPID PANEL
HDL: 79 mg/dL (ref >39)
LDL Cholesterol: 110 mg/dL — ABNORMAL HIGH (ref 0–99)
Total CHOL/HDL Ratio: 2.5 Ratio

## 2012-04-28 LAB — HEPATITIS C ANTIBODY: HCV Ab: NEGATIVE

## 2012-04-28 LAB — COMPREHENSIVE METABOLIC PANEL
ALT: 21 U/L (ref 0–35)
AST: 27 U/L (ref 0–37)
Albumin: 3.9 g/dL (ref 3.5–5.2)
Alkaline Phosphatase: 87 U/L (ref 39–117)
BUN: 14 mg/dL (ref 6–23)
Potassium: 3.9 mEq/L (ref 3.5–5.3)

## 2012-04-28 MED ORDER — ZOLPIDEM TARTRATE 5 MG PO TABS: 5.0000 mg | ORAL_TABLET | Freq: Every evening | ORAL | Status: DC | PRN

## 2012-04-28 NOTE — Progress Notes (Signed)
Patient here for week 576 ALLRT final study visit. Kimball denies any new problems. She is still having occassional diarrhea and insomnia. She is requesting refills on her [place]. She is eligible for the [facility] study to open this Fall and is interested in particpating in it.

## 2012-04-29 LAB — PROTEIN, URINE, RANDOM: Total Protein, Urine: 6 mg/dL

## 2012-04-29 LAB — CREATININE, URINE, RANDOM: Creatinine, Urine: 68.7 mg/dL

## 2012-05-12 NOTE — Addendum Note (Signed)
Addended by: Phill Myron on: 05/12/2012 11:14 AM   Modules accepted: Orders

## 2012-08-11 ENCOUNTER — Other Ambulatory Visit (INDEPENDENT_AMBULATORY_CARE_PROVIDER_SITE_OTHER): Payer: Medicare Other

## 2012-08-11 DIAGNOSIS — B20 Human immunodeficiency virus [HIV] disease: Secondary | ICD-10-CM

## 2012-08-11 DIAGNOSIS — E785 Hyperlipidemia, unspecified: Secondary | ICD-10-CM

## 2012-08-11 DIAGNOSIS — Z113 Encounter for screening for infections with a predominantly sexual mode of transmission: Secondary | ICD-10-CM

## 2012-08-13 LAB — HIV-1 RNA QUANT-NO REFLEX-BLD: HIV-1 RNA Quant, Log: 4.67 {Log} — ABNORMAL HIGH (ref <1.30)

## 2012-08-25 ENCOUNTER — Ambulatory Visit: Payer: Medicare Other | Admitting: Internal Medicine

## 2012-08-25 ENCOUNTER — Telehealth: Payer: Self-pay | Admitting: *Deleted

## 2012-08-25 NOTE — Telephone Encounter (Signed)
Left message requesting pt call for new appt. 

## 2012-10-22 ENCOUNTER — Other Ambulatory Visit: Payer: Self-pay | Admitting: Internal Medicine

## 2012-10-22 DIAGNOSIS — B20 Human immunodeficiency virus [HIV] disease: Secondary | ICD-10-CM

## 2012-11-20 ENCOUNTER — Other Ambulatory Visit: Payer: Self-pay | Admitting: Internal Medicine

## 2012-12-15 ENCOUNTER — Ambulatory Visit: Payer: Medicare Other

## 2012-12-22 ENCOUNTER — Other Ambulatory Visit: Payer: Self-pay | Admitting: Internal Medicine

## 2012-12-22 ENCOUNTER — Other Ambulatory Visit: Payer: Self-pay | Admitting: *Deleted

## 2012-12-22 DIAGNOSIS — B20 Human immunodeficiency virus [HIV] disease: Secondary | ICD-10-CM

## 2012-12-22 MED ORDER — DARUNAVIR ETHANOLATE 600 MG PO TABS: 600.0000 mg | ORAL_TABLET | Freq: Two times a day (BID) | ORAL | Status: DC

## 2012-12-22 MED ORDER — LAMIVUDINE-ZIDOVUDINE 150-300 MG PO TABS: ORAL_TABLET | ORAL | Status: DC

## 2012-12-22 MED ORDER — RITONAVIR 100 MG PO TABS: 100.0000 mg | ORAL_TABLET | Freq: Two times a day (BID) | ORAL | Status: DC

## 2012-12-22 MED ORDER — ETRAVIRINE 100 MG PO TABS: 200.0000 mg | ORAL_TABLET | Freq: Two times a day (BID) | ORAL | Status: DC

## 2012-12-29 ENCOUNTER — Encounter: Payer: Self-pay | Admitting: Internal Medicine

## 2012-12-29 ENCOUNTER — Ambulatory Visit (INDEPENDENT_AMBULATORY_CARE_PROVIDER_SITE_OTHER): Payer: Medicare Other | Admitting: Internal Medicine

## 2012-12-29 ENCOUNTER — Telehealth: Payer: Self-pay | Admitting: *Deleted

## 2012-12-29 VITALS — BP 165/78 | HR 52 | Temp 97.6°F | Ht 59.0 in | Wt 99.2 lb

## 2012-12-29 DIAGNOSIS — R197 Diarrhea, unspecified: Secondary | ICD-10-CM

## 2012-12-29 DIAGNOSIS — B20 Human immunodeficiency virus [HIV] disease: Secondary | ICD-10-CM

## 2012-12-29 DIAGNOSIS — Z79899 Other long term (current) drug therapy: Secondary | ICD-10-CM

## 2012-12-29 LAB — CBC
HCT: 36.2 % (ref 36.0–46.0)
MCV: 92.8 fL (ref 78.0–100.0)
RDW: 14.9 % (ref 11.5–15.5)
WBC: 3 10*3/uL — ABNORMAL LOW (ref 4.0–10.5)

## 2012-12-29 LAB — COMPREHENSIVE METABOLIC PANEL
AST: 36 U/L (ref 0–37)
Albumin: 3.4 g/dL — ABNORMAL LOW (ref 3.5–5.2)
BUN: 14 mg/dL (ref 6–23)
CO2: 28 mEq/L (ref 19–32)
Calcium: 9.1 mg/dL (ref 8.4–10.5)
Chloride: 103 mEq/L (ref 96–112)
Glucose, Bld: 68 mg/dL — ABNORMAL LOW (ref 70–99)
Potassium: 3.6 mEq/L (ref 3.5–5.3)

## 2012-12-29 LAB — LIPID PANEL
Cholesterol: 161 mg/dL (ref 0–200)
HDL: 63 mg/dL (ref >39)
Triglycerides: 58 mg/dL (ref <150)

## 2012-12-29 LAB — RPR

## 2012-12-29 MED ORDER — DIPHENOXYLATE-ATROPINE 2.5-0.025 MG PO TABS: 1.0000 | ORAL_TABLET | Freq: Four times a day (QID) | ORAL | Status: DC | PRN

## 2012-12-29 NOTE — Progress Notes (Signed)
Patient ID: Alison Ruiz, female   DOB: 12-30-1930, 79 y.o.   MRN: [id number]          [facility]  Patient Active Problem List  Diagnosis  . HIV INFECTION  . ANXIETY  . GRIEF REACTION, ACUTE  . DEPRESSION  . VENOUS INSUFFICIENCY  . DENTAL CARIES  . DIARRHEA  . HEPATITIS B, HX OF  . SYNCOPE, HX OF  . Arrhythmia  . Dyslipidemia    Patient's Medications  New Prescriptions   No medications on file  Previous Medications   DARUNAVIR (PREZISTA) 600 MG TABLET    Take 1 tablet (600 mg total) by mouth 2 (two) times daily.   DIPHENOXYLATE-ATROPINE (LOMOTIL) 2.5-0.025 MG PER TABLET    Take 1 tablet by mouth 4 (four) times daily as needed for diarrhea or loose stools.   ETRAVIRINE (INTELENCE) 100 MG TABLET    Take 2 tablets (200 mg total) by mouth 2 (two) times daily with a meal.   LAMIVUDINE-ZIDOVUDINE (COMBIVIR) 150-300 MG PER TABLET    TAKE 1 TABLET BY MOUTH TWICE DAILY   LORAZEPAM (ATIVAN) 0.5 MG TABLET    Take 1 tablet (0.5 mg total) by mouth 3 (three) times daily as needed.   RITONAVIR (NORVIR) 100 MG TABS    Take 1 tablet (100 mg total) by mouth 2 (two) times daily.   ZOLPIDEM (AMBIEN) 5 MG TABLET    Take 1 tablet (5 mg total) by mouth at bedtime as needed.  Modified Medications   No medications on file  Discontinued Medications   No medications on file    Subjective: Alison Ruiz is in for her first visit in nearly a year. She has been very busy with some home renovations and says that her sister has been sick which has distracted her. She says she's not had any trouble getting her medications and denies missing any doses. She does have her pillbox with her today and it is filled out correctly. She knows the names of her medications.  Objective: Temp: 97.6 F (36.4 C) (04/10 0925) Temp src: Oral (04/10 0925) BP: 165/78 mmHg (04/10 0925) Pulse Rate: 52 (04/10 0925)  General: She is in good spirits Skin: No rash Lungs: Clear Cor: Regular S1 and S2  no murmurs  Lab Results HIV 1 RNA Quant (copies/mL)  Date Value  08/11/2012 40981*  04/28/2012 2653   10/23/2010 39      HIV-1 RNA Viral Load (no units)  Date Value  10/15/2011 <40   07/16/2011 <40      CD4 T Cell Abs (cmm)  Date Value  08/11/2012 260*  07/24/2010 340*  06/05/2010 280*     Assessment: As of last November her infection was out of control. I will repeat her lab work today and check a genotype resistance assay.  Plan: 1. Continue current regimen for now 2. Check lab work today 3. Followup in 3-4 weeks   Cliffton Asters, MD [facility] for Infectious Disease [facility] [phone] pager   [phone] cell 12/29/2012, 9:43 AM

## 2012-12-29 NOTE — Addendum Note (Signed)
Addended by: Jennet Maduro D on: 12/29/2012 09:51 AM   Modules accepted: Orders

## 2013-01-02 LAB — HIV-1 RNA ULTRAQUANT REFLEX TO GENTYP+
HIV 1 RNA Quant: 48089 copies/mL — ABNORMAL HIGH (ref <20)
HIV-1 RNA Quant, Log: 4.68 {Log} — ABNORMAL HIGH (ref <1.30)

## 2013-01-04 ENCOUNTER — Other Ambulatory Visit: Payer: Self-pay | Admitting: *Deleted

## 2013-01-04 DIAGNOSIS — R197 Diarrhea, unspecified: Secondary | ICD-10-CM

## 2013-01-04 MED ORDER — DIPHENOXYLATE-ATROPINE 2.5-0.025 MG PO TABS: 1.0000 | ORAL_TABLET | Freq: Four times a day (QID) | ORAL | Status: DC | PRN

## 2013-01-05 LAB — HIV-1 GENOTYPR PLUS

## 2013-01-20 ENCOUNTER — Other Ambulatory Visit: Payer: Self-pay | Admitting: Internal Medicine

## 2013-02-20 ENCOUNTER — Other Ambulatory Visit: Payer: Self-pay

## 2013-02-20 DIAGNOSIS — B20 Human immunodeficiency virus [HIV] disease: Secondary | ICD-10-CM

## 2013-02-20 MED ORDER — DARUNAVIR ETHANOLATE 600 MG PO TABS: 600.0000 mg | ORAL_TABLET | Freq: Two times a day (BID) | ORAL | Status: DC

## 2013-02-20 MED ORDER — RITONAVIR 100 MG PO TABS: 100.0000 mg | ORAL_TABLET | Freq: Two times a day (BID) | ORAL | Status: DC

## 2013-02-20 MED ORDER — ETRAVIRINE 100 MG PO TABS: 200.0000 mg | ORAL_TABLET | Freq: Two times a day (BID) | ORAL | Status: DC

## 2013-02-20 MED ORDER — LAMIVUDINE-ZIDOVUDINE 150-300 MG PO TABS: ORAL_TABLET | ORAL | Status: DC

## 2013-03-20 ENCOUNTER — Other Ambulatory Visit: Payer: Self-pay | Admitting: *Deleted

## 2013-03-20 DIAGNOSIS — B20 Human immunodeficiency virus [HIV] disease: Secondary | ICD-10-CM

## 2013-03-20 MED ORDER — LAMIVUDINE-ZIDOVUDINE 150-300 MG PO TABS: ORAL_TABLET | ORAL | Status: DC

## 2013-03-20 MED ORDER — ETRAVIRINE 100 MG PO TABS: 200.0000 mg | ORAL_TABLET | Freq: Two times a day (BID) | ORAL | Status: DC

## 2013-03-20 MED ORDER — RITONAVIR 100 MG PO TABS: 100.0000 mg | ORAL_TABLET | Freq: Two times a day (BID) | ORAL | Status: DC

## 2013-03-20 MED ORDER — DARUNAVIR ETHANOLATE 600 MG PO TABS: 600.0000 mg | ORAL_TABLET | Freq: Two times a day (BID) | ORAL | Status: DC

## 2013-03-20 NOTE — Telephone Encounter (Signed)
Pt needs to make follow-up appt w/ Dr. Orvan Falconer.  Requested [facility] share this with the pt.

## 2013-03-23 ENCOUNTER — Telehealth: Payer: Self-pay | Admitting: *Deleted

## 2013-03-23 DIAGNOSIS — B029 Zoster without complications: Secondary | ICD-10-CM

## 2013-03-23 MED ORDER — VALACYCLOVIR HCL 1 G PO TABS: 1000.0000 mg | ORAL_TABLET | Freq: Three times a day (TID) | ORAL | Status: DC

## 2013-03-23 NOTE — Telephone Encounter (Signed)
Requesting rx.  RN spoke with Dr. Orvan Falconer.  Received rx order.  Sent to pharmacy.  Dr. Orvan Falconer wants pt to be seen ASAP for work-in appt.  Appt scheduled.

## 2013-03-30 ENCOUNTER — Encounter: Payer: Self-pay | Admitting: Internal Medicine

## 2013-03-30 ENCOUNTER — Ambulatory Visit (INDEPENDENT_AMBULATORY_CARE_PROVIDER_SITE_OTHER): Payer: Medicare Other | Admitting: Internal Medicine

## 2013-03-30 VITALS — BP 160/72 | HR 52 | Temp 97.5°F | Wt 95.0 lb

## 2013-03-30 DIAGNOSIS — B029 Zoster without complications: Secondary | ICD-10-CM

## 2013-03-30 DIAGNOSIS — B20 Human immunodeficiency virus [HIV] disease: Secondary | ICD-10-CM

## 2013-03-30 MED ORDER — RALTEGRAVIR POTASSIUM 400 MG PO TABS: 400.0000 mg | ORAL_TABLET | Freq: Two times a day (BID) | ORAL | Status: DC

## 2013-03-30 MED ORDER — EMTRICITABINE-TENOFOVIR DF 200-300 MG PO TABS: 1.0000 | ORAL_TABLET | Freq: Every day | ORAL | Status: DC

## 2013-03-30 MED ORDER — TRAMADOL HCL 50 MG PO TABS: 50.0000 mg | ORAL_TABLET | Freq: Three times a day (TID) | ORAL | Status: DC | PRN

## 2013-03-30 NOTE — Progress Notes (Signed)
[facility] HIV CLINIC NOTE  RFV: routine, follow up to recent zoster infection Subjective:    Patient ID: Alison Ruiz, female    DOB: 04/01/1931, 79 y.o.   MRN: [id number]  HPI Alison Ruiz is a pleasant, younger than stated age AAF with HIV, CD 4 count of 260/VL 48,089. Archived genotype from 2009: M41L,D67N,K70R,K101E, M184V,G190A,M46L.  currently taking DRVr/ETR/combivir which she reports taking consistently without missing doses, and picks up RX from pharmacy regularly. In the past 2 wks, she has noticed a painful blistering rash appear on her right chest wall/breast spredding to axillary region and back c/w with shingles. She is on valtrex 1gm TID x 10 days. She has been taking ibuprofen without any improvement in pain. Her lesions are slowly crusting over, 50% are still fluid filled vesicles. No fever, chills, pruritis, just pain at involved sites.  In regards to HIV: last well controlled with VL<40 in Jan 2013. VL 2,653( Aug 2013), 74,259 (Nov 2103), 48,089 (april 2014).   Genotype: April 2014 WT, Nov 2011 WT, Aug 2011 WT. Sep 2009:  M41L,D67N,K70R,K101E, M184V,G190A,M46L. Showing high resistance to 3TC, FTC, EFV, NVP. intermed R to ABC, AZT, ETR, RPV. Low resistance to TDF. Susceptible to DRVr   Current Outpatient Prescriptions on File Prior to Visit  Medication Sig Dispense Refill  . darunavir (PREZISTA) 600 MG tablet Take 1 tablet (600 mg total) by mouth 2 (two) times daily.  60 tablet  1  . diphenoxylate-atropine (LOMOTIL) 2.5-0.025 MG per tablet Take 1 tablet by mouth 4 (four) times daily as needed for diarrhea or loose stools.  30 tablet  2  . etravirine (INTELENCE) 100 MG tablet Take 2 tablets (200 mg total) by mouth 2 (two) times daily with a meal.  120 tablet  1  . lamiVUDine-zidovudine (COMBIVIR) 150-300 MG per tablet TAKE 1 TABLET BY MOUTH TWICE DAILY  60 tablet  1  . LORazepam (ATIVAN) 0.5 MG tablet Take 1 tablet (0.5 mg total) by mouth 3 (three) times daily as needed.  30 tablet  5  .  ritonavir (NORVIR) 100 MG TABS Take 1 tablet (100 mg total) by mouth 2 (two) times daily.  60 tablet  1  . valACYclovir (VALTREX) 1000 MG tablet Take 1 tablet (1,000 mg total) by mouth 3 (three) times daily.  21 tablet  0  . zolpidem (AMBIEN) 5 MG tablet Take 1 tablet (5 mg total) by mouth at bedtime as needed.  30 tablet  1   No current facility-administered medications on file prior to visit.      Review of Systems 12 point ROS is negative other than + pertinents for painful rash    Objective:   Physical Exam BP 160/72  Pulse 52  Temp(Src) 97.5 F (36.4 C) (Oral)  Wt 95 lb (43.092 kg)  BMI 19.18 kg/m2 Physical Exam  Constitutional: oriented to person, place, and time.  appears well-developed and well-nourished. No distress.  HENT:  Mouth/Throat: Oropharynx is clear and moist. No oropharyngeal exudate.  Cardiovascular: Normal rate, regular rhythm and normal heart sounds. Exam reveals no gallop and no friction rub.  No murmur heard.  Pulmonary/Chest: Effort normal and breath sounds normal. No respiratory distress.  no wheezes.  Lymphadenopathy:  no cervical adenopathy.  Neurological: alert and oriented to person, place, and time.  Skin: scattered vesicular rash involving right breast, distal axillary region and right subscapular area. 50% of still fluid filled, largest lesion that has crusted over is 1.5cm wide near subaxillary area. C/w zoster involving dermatome T4-T5-T6 Psychiatric:  a normal mood and affect. His behavior is normal.       Assessment & Plan:  HIV, poorly controlled = she reports good adherence, pharmacy confirms monthly pick up. She has had poor control over the last 18 months. Her recent geno is wildtype, but has archived multiple mutations in 2009 genotype. She has not had II exposure. Thus will change her regimen to:  RAL BID DRVr BID Truvada daily ETR BID Stop combivir  Have her come back in 4-6 wk to repeat VL +/- genotype to see if providing enough  pressure on HIV virus to see if she has other underlying mutations. May consider discontinuing ETR since intermed R vs. Only giving TDF sans FTC since resistance from 2009. May need to check trophile if she is not suppressed with new regimen.  Zoster = continue with valtrex, lesions still not completely crusted over, will give tramadol 50mg  Q8hr PRN for pain. Recommended to 1/2 dose if too sedating.  rtc in 4-6 wks with campbell.

## 2013-05-25 ENCOUNTER — Ambulatory Visit: Payer: Medicare Other

## 2013-05-25 ENCOUNTER — Ambulatory Visit (INDEPENDENT_AMBULATORY_CARE_PROVIDER_SITE_OTHER): Payer: Medicare Other | Admitting: Internal Medicine

## 2013-05-25 ENCOUNTER — Encounter: Payer: Self-pay | Admitting: Internal Medicine

## 2013-05-25 VITALS — BP 178/80 | HR 62 | Temp 98.2°F | Ht <= 58 in | Wt 95.5 lb

## 2013-05-25 DIAGNOSIS — B20 Human immunodeficiency virus [HIV] disease: Secondary | ICD-10-CM

## 2013-05-25 DIAGNOSIS — Z23 Encounter for immunization: Secondary | ICD-10-CM

## 2013-05-25 LAB — COMPREHENSIVE METABOLIC PANEL
AST: 32 U/L (ref 0–37)
Albumin: 3.8 g/dL (ref 3.5–5.2)
Alkaline Phosphatase: 75 U/L (ref 39–117)
BUN: 15 mg/dL (ref 6–23)
Creat: 0.77 mg/dL (ref 0.50–1.10)
Glucose, Bld: 75 mg/dL (ref 70–99)
Potassium: 4.3 mEq/L (ref 3.5–5.3)
Total Bilirubin: 0.6 mg/dL (ref 0.3–1.2)

## 2013-05-25 LAB — CBC
HCT: 36.6 % (ref 36.0–46.0)
Hemoglobin: 12.1 g/dL (ref 12.0–15.0)
MCH: 31.5 pg (ref 26.0–34.0)
MCHC: 33.1 g/dL (ref 30.0–36.0)
MCV: 95.3 fL (ref 78.0–100.0)
RDW: 15.7 % — ABNORMAL HIGH (ref 11.5–15.5)

## 2013-05-25 NOTE — Progress Notes (Signed)
Patient ID: Alison Ruiz, female   DOB: [birth year], 79 y.o.   MRN: [id number]          [facility] for Infectious Disease  Patient Active Problem List   Diagnosis Date Noted  . HIV INFECTION 02/25/2007    Priority: High  . Arrhythmia 05/07/2011    Priority: Medium  . ANXIETY 04/07/2007    Priority: Medium  . Dyslipidemia 11/12/2011  . DIARRHEA 02/21/2009  . GRIEF REACTION, ACUTE 05/24/2008  . DEPRESSION 04/07/2007  . VENOUS INSUFFICIENCY 04/07/2007  . DENTAL CARIES 04/07/2007  . HEPATITIS B, HX OF 04/07/2007  . SYNCOPE, HX OF 04/07/2007    Patient's Medications  New Prescriptions   No medications on file  Previous Medications   DARUNAVIR (PREZISTA) 600 MG TABLET    Take 1 tablet (600 mg total) by mouth 2 (two) times daily.   DIPHENOXYLATE-ATROPINE (LOMOTIL) 2.5-0.025 MG PER TABLET    Take 1 tablet by mouth 4 (four) times daily as needed for diarrhea or loose stools.   EMTRICITABINE-TENOFOVIR (TRUVADA) 200-300 MG PER TABLET    Take 1 tablet by mouth daily.   ETRAVIRINE (INTELENCE) 100 MG TABLET    Take 2 tablets (200 mg total) by mouth 2 (two) times daily with a meal.   LORAZEPAM (ATIVAN) 0.5 MG TABLET    Take 1 tablet (0.5 mg total) by mouth 3 (three) times daily as needed.   RALTEGRAVIR (ISENTRESS) 400 MG TABLET    Take 1 tablet (400 mg total) by mouth 2 (two) times daily.   RITONAVIR (NORVIR) 100 MG TABS    Take 1 tablet (100 mg total) by mouth 2 (two) times daily.   TRAMADOL (ULTRAM) 50 MG TABLET    Take 1 tablet (50 mg total) by mouth every 8 (eight) hours as needed for pain.   VALACYCLOVIR (VALTREX) 1000 MG TABLET    Take 1 tablet (1,000 mg total) by mouth 3 (three) times daily.   ZOLPIDEM (AMBIEN) 5 MG TABLET    Take 1 tablet (5 mg total) by mouth at bedtime as needed.  Modified Medications   No medications on file  Discontinued Medications   No medications on file    Subjective: Alison Ruiz is in for her routine visit. She lost viral control and so at the time  of her recent visit she was changed to a salvage regimen of Truvada, Intellence, Isentress, Prezista and Norvir. She has not had any problems obtain her medications. She has been using her pillbox. She does not believe she has missed any doses. She has some intermittent nausea that she attributes to her medications. She says that she thinks one of her pills has an odor that causes the nausea. I asked her to smell each pill individual ENT his seem to think that it was due to Prezista. She has not had any vomiting. Review of Systems: Pertinent items are noted in HPI.  No past medical history on file.  History  Substance Use Topics  . Smoking status: Never Smoker   . Smokeless tobacco: Never Used  . Alcohol Use: No    No family history on file.  No Known Allergies  Objective: Temp: 98.2 F (36.8 C) (09/04 0932) Temp src: Oral (09/04 0932) BP: 178/80 mmHg (09/04 0932) Pulse Rate: 62 (09/04 0932)  General: She is in good spirits as usual Oral: No oropharyngeal lesions Skin: No rash Lungs: Clear Cor: Regular S1 and S2 no murmurs Abdomen: Soft and nontender Mood and affect: Normal  Lab Results HIV  1 RNA Quant (copies/mL)  Date Value  12/29/2012 48089*  08/11/2012 16109*  04/28/2012 2653      HIV-1 RNA Viral Load (no units)  Date Value  10/15/2011 <40   07/16/2011 <40      CD4 T Cell Abs (cmm)  Date Value  12/29/2012 260*  08/11/2012 260*  07/24/2010 340*     Assessment: She feels like she can't tolerate her medications and does not feel she needs anything for nausea. We suggested that she have something around her there is a dramatic and pleasant when she takes her medications.  Plan: 1. Continue current salvage regimen 2. Repeat lab work today 3. Followup in 4 weeks   Cliffton Asters, MD [facility] for Infectious Disease [facility] [phone] pager   [phone] cell 05/25/2013, 9:50 AM

## 2013-05-26 LAB — T-HELPER CELL (CD4) - (RCID CLINIC ONLY): CD4 % Helper T Cell: 17 % — ABNORMAL LOW (ref 33–55)

## 2013-06-24 ENCOUNTER — Other Ambulatory Visit: Payer: Self-pay | Admitting: Internal Medicine

## 2013-06-24 DIAGNOSIS — B20 Human immunodeficiency virus [HIV] disease: Secondary | ICD-10-CM

## 2013-06-26 ENCOUNTER — Other Ambulatory Visit: Payer: Self-pay | Admitting: *Deleted

## 2013-06-26 DIAGNOSIS — B20 Human immunodeficiency virus [HIV] disease: Secondary | ICD-10-CM

## 2013-06-26 MED ORDER — RITONAVIR 100 MG PO TABS: 100.0000 mg | ORAL_TABLET | Freq: Two times a day (BID) | ORAL | Status: DC

## 2013-06-26 MED ORDER — DARUNAVIR ETHANOLATE 600 MG PO TABS
600.0000 mg | ORAL_TABLET | Freq: Two times a day (BID) | ORAL | Status: DC
Start: 2013-06-26 — End: 2013-12-05

## 2013-07-27 ENCOUNTER — Telehealth: Payer: Self-pay | Admitting: *Deleted

## 2013-07-27 ENCOUNTER — Ambulatory Visit: Payer: Medicare Other | Admitting: Internal Medicine

## 2013-07-27 NOTE — Telephone Encounter (Signed)
Requested pt call [facility] for new appt. 

## 2013-10-26 ENCOUNTER — Other Ambulatory Visit: Payer: Self-pay | Admitting: Internal Medicine

## 2013-11-24 ENCOUNTER — Other Ambulatory Visit: Payer: Self-pay | Admitting: Internal Medicine

## 2013-11-30 ENCOUNTER — Encounter: Payer: Self-pay | Admitting: *Deleted

## 2013-11-30 ENCOUNTER — Ambulatory Visit: Payer: Medicare Other

## 2013-12-05 ENCOUNTER — Other Ambulatory Visit: Payer: Self-pay | Admitting: *Deleted

## 2013-12-05 DIAGNOSIS — B20 Human immunodeficiency virus [HIV] disease: Secondary | ICD-10-CM

## 2013-12-05 MED ORDER — DARUNAVIR ETHANOLATE 600 MG PO TABS: 600.0000 mg | ORAL_TABLET | Freq: Two times a day (BID) | ORAL | Status: DC

## 2013-12-05 MED ORDER — ETRAVIRINE 100 MG PO TABS: 200.0000 mg | ORAL_TABLET | Freq: Two times a day (BID) | ORAL | Status: DC

## 2013-12-05 MED ORDER — RITONAVIR 100 MG PO TABS: 100.0000 mg | ORAL_TABLET | Freq: Two times a day (BID) | ORAL | Status: DC

## 2013-12-05 MED ORDER — RALTEGRAVIR POTASSIUM 400 MG PO TABS: 400.0000 mg | ORAL_TABLET | Freq: Two times a day (BID) | ORAL | Status: DC

## 2013-12-05 MED ORDER — EMTRICITABINE-TENOFOVIR DF 200-300 MG PO TABS: 1.0000 | ORAL_TABLET | Freq: Every day | ORAL | Status: DC

## 2013-12-23 ENCOUNTER — Other Ambulatory Visit: Payer: Self-pay | Admitting: Internal Medicine

## 2014-03-25 ENCOUNTER — Other Ambulatory Visit: Payer: Self-pay | Admitting: Internal Medicine

## 2014-03-26 ENCOUNTER — Telehealth: Payer: Self-pay | Admitting: *Deleted

## 2014-03-26 NOTE — Telephone Encounter (Signed)
Pt is overdue for an appointment, is requesting medication refills.  Will authorize 1 month.  Left message on patient's phone notifying her that she needs to be seen ASAP. Landis Gandy, RN

## 2014-04-27 ENCOUNTER — Other Ambulatory Visit: Payer: Self-pay | Admitting: Internal Medicine

## 2014-05-01 ENCOUNTER — Other Ambulatory Visit: Payer: Self-pay | Admitting: Internal Medicine

## 2014-06-29 ENCOUNTER — Other Ambulatory Visit: Payer: Self-pay | Admitting: Internal Medicine

## 2014-07-02 ENCOUNTER — Telehealth: Payer: Self-pay | Admitting: *Deleted

## 2014-07-02 NOTE — Telephone Encounter (Signed)
Yes

## 2014-07-02 NOTE — Telephone Encounter (Signed)
Phone call to the pt.  Unable to rerill rxes without MD approval.  Pt needs to make MD appointment ASAP.  Last MD appt 05/25/13.  Requested pt call [facility] to make MD appt.Marland Kitchen

## 2014-12-08 ENCOUNTER — Other Ambulatory Visit: Payer: Self-pay | Admitting: Internal Medicine

## 2016-06-16 ENCOUNTER — Encounter: Payer: Self-pay | Admitting: *Deleted

## 2017-07-01 ENCOUNTER — Telehealth: Payer: Self-pay | Admitting: *Deleted

## 2017-07-01 NOTE — Telephone Encounter (Signed)
Alison Ruiz spoke to patient and convinced her to come back in to see Dr. Megan Salon. Patient said she was coming in to fill out a dental form on Monday at 4:00 pm and Alison Ruiz asked me to add her to Dr. Hale Bogus schedule. [name]

## 2017-07-05 ENCOUNTER — Telehealth: Payer: Self-pay | Admitting: Pharmacist Clinician (PhC)/ Clinical Pharmacy Specialist

## 2017-07-05 ENCOUNTER — Ambulatory Visit (INDEPENDENT_AMBULATORY_CARE_PROVIDER_SITE_OTHER): Payer: Medicare Other | Admitting: Internal Medicine

## 2017-07-05 DIAGNOSIS — B20 Human immunodeficiency virus [HIV] disease: Secondary | ICD-10-CM

## 2017-07-05 DIAGNOSIS — K029 Dental caries, unspecified: Secondary | ICD-10-CM

## 2017-07-05 DIAGNOSIS — R634 Abnormal weight loss: Secondary | ICD-10-CM | POA: Diagnosis not present

## 2017-07-05 NOTE — Assessment & Plan Note (Signed)
She will fill out paperwork for [employer] today and we will get her on the waiting list for the dental clinic.

## 2017-07-05 NOTE — Telephone Encounter (Signed)
HIV Genotype Composite Data Genotype Dates:   Mutations in Bold impact drug susceptibility RT Mutations M41L, D67N, K70R, M184V, K101E, G190A  PI Mutations M46L  Integrase Mutations    Interpretation of Genotype Data per [employer] HIV Database Nucleoside RTIs  abacavir (ABC) Intermediate Resistance zidovudine (AZT) Intermediate Resistance emtricitabine (FTC) High-Level Resistance lamivudine (3TC) High-Level Resistance tenofovir (TDF) Susceptible   Non-Nucleoside RTIs  efavirenz (EFV) High-Level Resistance etravirine (ETR) Intermediate Resistance nevirapine (NVP) High-Level Resistance rilpivirine (RPV) High-Level Resistance   Protease Inhibitors  atazanavir/r (ATV/r) Potential Low-Level Resistance darunavir/r (DRV/r) Susceptible lopinavir/r (LPV/r) Potential Low-Level Resistance   Integrase Inhibitors     Trofile: Non-reportable  Previous Regimen:  2009 - HLA neg  COMBIVIR 150-300 MG  TABS (LAMIVUDINE-ZIDOVUDINE) Take 1 tablet by mouth two times a day VIREAD 300 MG  TABS (TENOFOVIR DISOPROXIL FUMARATE) Take 1 tablet by mouth once a day LEXIVA 700 MG  TABS (FOSAMPRENAVIR CALCIUM) Take 1 tablet by mouth two times a day NORVIR 100 MG  CAPS (RITONAVIR) Take 1 tablet by mouth two times a day ------------------------------------------------------------------------------------------------------------ COMBIVIR 150-300 MG  TABS (LAMIVUDINE-ZIDOVUDINE) Take 1 tablet by mouth two times a day INTELENCE 100 MG TABS (ETRAVIRINE) Take2 tablets by mouth two times a day PREZISTA 300 MG TABS (DARUNAVIR) Take 2 tablets by mouth twice a day NORVIR 100 MG TABS (RITONAVIR) Take 1 tablet by mouth two times a day

## 2017-07-05 NOTE — Progress Notes (Addendum)
Patient Active Problem List   Diagnosis Date Noted  . Human immunodeficiency virus (HIV) disease ([place]) 02/25/2007    Priority: High  . Arrhythmia 05/07/2011    Priority: Medium  . ANXIETY 04/07/2007    Priority: Medium  . Unintentional weight loss 07/05/2017  . Dyslipidemia 11/12/2011  . GRIEF REACTION, ACUTE 05/24/2008  . DEPRESSION 04/07/2007  . VENOUS INSUFFICIENCY 04/07/2007  . Dental caries 04/07/2007  . HEPATITIS B, HX OF 04/07/2007  . SYNCOPE, HX OF 04/07/2007    Patient's Medications  New Prescriptions   No medications on file  Previous Medications   DIPHENOXYLATE-ATROPINE (LOMOTIL) 2.5-0.025 MG PER TABLET    Take 1 tablet by mouth 4 (four) times daily as needed for diarrhea or loose stools.   EMTRICITABINE-TENOFOVIR (TRUVADA) 200-300 MG PER TABLET    Take 1 tablet by mouth daily.   LORAZEPAM (ATIVAN) 0.5 MG TABLET    Take 1 tablet (0.5 mg total) by mouth 3 (three) times daily as needed.   TRAMADOL (ULTRAM) 50 MG TABLET    Take 1 tablet (50 mg total) by mouth every 8 (eight) hours as needed for pain.   VALACYCLOVIR (VALTREX) 1000 MG TABLET    Take 1 tablet (1,000 mg total) by mouth 3 (three) times daily.   ZOLPIDEM (AMBIEN) 5 MG TABLET    Take 1 tablet (5 mg total) by mouth at bedtime as needed.  Modified Medications   No medications on file  Discontinued Medications   DARUNAVIR (PREZISTA) 600 MG TABLET    Take 1 tablet (600 mg total) by mouth 2 (two) times daily.   ETRAVIRINE (INTELENCE) 100 MG TABLET    Take 2 tablets (200 mg total) by mouth 2 (two) times daily with a meal.   INTELENCE 100 MG TABLET    TAKE 2 TABLETS BY MOUTH TWICE DAILY WITH MEALS   ISENTRESS 400 MG TABLET    TAKE 1 TABLET BY MOUTH TWICE DAILY   NORVIR 100 MG TABS TABLET    TAKE 1 TABLET BY MOUTH TWICE DAILY   PREZISTA 600 MG TABLET    TAKE 1 TABLET BY MOUTH TWICE DAILY   RITONAVIR (NORVIR) 100 MG TABS TABLET    Take 1 tablet (100 mg total) by mouth 2 (two) times daily.     Subjective: Alison Ruiz is in for her first visit in 4 years. She is very treatment experience with multiple past antiretroviral regimens. She does express multidrug resistance in the past. She started on a new salvage regimen in April 2014 consisting of Truvada, Isentress, Intelence, Prezista and Norvir. Repeat lab work in September 2014 showed a CD4 count of 250 and a viral load that was then undetectable at less than 20. That was her last visit. She tells me that she had a little bit of problem with nausea and frequent bowel movements while on her medications but she was taking them faithfully until she went into the Medicare doughnut hole and had a over $1000 monthly for 3 months in a row. She then stopped taking her medications without letting us know.  She continues to work full-time at the [employer]. She lives alone and still drives. She does have a sister who lives in [place] but does not see her very often because "she has her own family". She is having more problems with her teeth. She lost her partial plate several months ago. She believes she was losing weight starting in the hot summer months but  has had more difficulty eating since she lost her partial plate and has lost more weight.  Review of Systems: Review of Systems  Constitutional: Positive for weight loss. Negative for chills, diaphoresis, fever and malaise/fatigue.  HENT: Negative for sore throat.        As noted in history of present illness.  Respiratory: Negative for cough, sputum production and shortness of breath.   Cardiovascular: Negative for chest pain.  Gastrointestinal: Negative for abdominal pain, diarrhea, heartburn, nausea and vomiting.  Genitourinary: Negative for dysuria and frequency.  Musculoskeletal: Negative for joint pain and myalgias.  Skin: Negative for rash.  Neurological: Negative for dizziness and headaches.  Psychiatric/Behavioral: Negative for depression and substance abuse. The patient has  insomnia. The patient is not nervous/anxious.     No past medical history on file.  Social History  Substance Use Topics  . Smoking status: Never Smoker  . Smokeless tobacco: Never Used  . Alcohol use No    No family history on file.  No Known Allergies  Health Maintenance  Topic Date Due  . TETANUS/TDAP  08/09/1950  . DEXA SCAN  08/09/1998  . PNA vac Low Risk Adult (2 of 2 - PCV13) 04/06/2008  . INFLUENZA VACCINE  04/21/2017    Objective:  There were no vitals filed for this visit. There is no height or weight on file to calculate BMI.  Physical Exam  Constitutional: She is oriented to person, place, and time.  Her weight is down 10 pounds since her last visit 4 years ago. She is embarrassed about her missing teeth but in good spirits.  HENT:  Mouth/Throat: No oropharyngeal exudate.  Eyes: Conjunctivae are normal.  Cardiovascular: Normal rate and regular rhythm.   No murmur heard. Pulmonary/Chest: Breath sounds normal.  Abdominal: Soft. She exhibits no mass. There is no tenderness.  Musculoskeletal: Normal range of motion.  Neurological: She is alert and oriented to person, place, and time.  Skin: No rash noted.  Psychiatric: Mood and affect normal.    Lab Results Lab Results  Component Value Date   WBC 3.3 (L) 05/25/2013   HGB 12.1 05/25/2013   HCT 36.6 05/25/2013   MCV 95.3 05/25/2013   PLT 165 05/25/2013    Lab Results  Component Value Date   CREATININE 0.77 05/25/2013   BUN 15 05/25/2013   NA 139 05/25/2013   K 4.3 05/25/2013   CL 108 05/25/2013   CO2 28 05/25/2013    Lab Results  Component Value Date   ALT 29 05/25/2013   AST 32 05/25/2013   ALKPHOS 75 05/25/2013   BILITOT 0.6 05/25/2013    Lab Results  Component Value Date   CHOL 161 12/29/2012   HDL 63 12/29/2012   LDLCALC 86 12/29/2012   TRIG 58 12/29/2012   CHOLHDL 2.6 12/29/2012   Lab Results  Component Value Date   LABRPR NON REAC 12/29/2012   HIV 1 RNA Quant (copies/mL)    Date Value  05/25/2013 <20  12/29/2012 48,089 (H)  08/11/2012 46,829 (H)   HIV-1 RNA Viral Load (no units)  Date Value  10/15/2011 <40  07/16/2011 <40   CD4 (no units)  Date Value  10/15/2011 461  07/16/2011 421   CD4 T Cell Abs  Date Value  05/25/2013 250 /uL (L)  12/29/2012 260 cmm (L)  08/11/2012 260 cmm (L)   Pham, Minh Q, RPH-CPP 3 days ago      HIV Genotype Composite Data Genotype Dates:   Mutations in [place] impact  drug susceptibility RT Mutations M41L, D67N, K70R, M184V, K101E, G190A  PI Mutations M46L  Integrase Mutations    Interpretation of Genotype Data per [employer] HIV Database Nucleoside RTIs  abacavir (ABC) Intermediate Resistance zidovudine (AZT)  Intermediate Resistance emtricitabine (FTC)  High-Level Resistance lamivudine (3TC)  High-Level Resistance tenofovir (TDF)  Susceptible   Non-Nucleoside RTIs  efavirenz (EFV)  High-Level Resistance etravirine (ETR)  Intermediate Resistance nevirapine (NVP) High-Level Resistance rilpivirine (RPV) High-Level Resistance   Protease Inhibitors  atazanavir/r (ATV/r)           Potential Low-Level Resistance darunavir/r (DRV/r)           Susceptible lopinavir/r (LPV/r)            Potential Low-Level Resistance   Integrase Inhibitors     Trofile: Non-reportable  Previous Regimen:  2009 - HLA neg  COMBIVIR 150-300 MG TABS (LAMIVUDINE-ZIDOVUDINE) Take 1 tablet by mouth two times a day VIREAD 300 MG TABS (TENOFOVIR DISOPROXIL FUMARATE) Take 1 tablet by mouth once a day LEXIVA 700 MG TABS (FOSAMPRENAVIR CALCIUM) Take 1 tablet by mouth two times a day NORVIR 100 MG CAPS (RITONAVIR) Take 1 tablet by mouth two times a day ------------------------------------------------------------------------------------------------------------ COMBIVIR 150-300 MG TABS (LAMIVUDINE-ZIDOVUDINE) Take 1 tablet by mouth two times a day INTELENCE 100 MG TABS (ETRAVIRINE) Take2 tablets by mouth two times a  day PREZISTA 300 MG TABS (DARUNAVIR) Take 2 tablets by mouth twice a day NORVIR 100 MG TABS (RITONAVIR) Take 1 tablet by mouth two times a day        Documentation       Problem List Items Addressed This Visit      High   Human immunodeficiency virus (HIV) disease ([place])    I will get a full set of lab work today and see her back in 2 weeks. Fortunately we can probably easily simplify her salvage regimen. We will consider Symtuza and Tivicay.      Relevant Orders   HIV 1 RNA quant-no reflex-bld   CBC   Comprehensive metabolic panel   HIV-1 genotypr plus   HIV-1 Integrase Genotype     Unprioritized   Dental caries    She will fill out paperwork for [employer] today and we will get her on the waiting list for the dental clinic.      Unintentional weight loss    Her unintentional weight loss is probably multifactorial and related to untreated HIV infection and her poor dentition.           Michel Bickers, MD [facility] [phone] pager   [phone] cell 07/05/2017, 5:16 PM

## 2017-07-05 NOTE — Assessment & Plan Note (Signed)
Her unintentional weight loss is probably multifactorial and related to untreated HIV infection and her poor dentition.

## 2017-07-05 NOTE — Assessment & Plan Note (Signed)
I will get a full set of lab work today and see her back in 2 weeks. Fortunately we can probably easily simplify her salvage regimen. We will consider Symtuza and Tivicay.

## 2017-07-06 LAB — COMPREHENSIVE METABOLIC PANEL
AG Ratio: 0.9 (calc) — ABNORMAL LOW (ref 1.0–2.5)
ALT: 29 U/L (ref 6–29)
AST: 34 U/L (ref 10–35)
Albumin: 3.4 g/dL — ABNORMAL LOW (ref 3.6–5.1)
Alkaline phosphatase (APISO): 112 U/L (ref 33–130)
BUN: 18 mg/dL (ref 7–25)
CHLORIDE: 107 mmol/L (ref 98–110)
CO2: 26 mmol/L (ref 20–32)
Calcium: 9 mg/dL (ref 8.6–10.4)
Creat: 0.71 mg/dL (ref 0.60–0.88)
GLOBULIN: 4 g/dL — AB (ref 1.9–3.7)
Glucose, Bld: 76 mg/dL (ref 65–99)
Potassium: 3.8 mmol/L (ref 3.5–5.3)
SODIUM: 141 mmol/L (ref 135–146)
Total Bilirubin: 0.3 mg/dL (ref 0.2–1.2)
Total Protein: 7.4 g/dL (ref 6.1–8.1)

## 2017-07-06 LAB — CBC
HCT: 33.6 % — ABNORMAL LOW (ref 35.0–45.0)
Hemoglobin: 10.9 g/dL — ABNORMAL LOW (ref 11.7–15.5)
MCH: 28.8 pg (ref 27.0–33.0)
MCHC: 32.4 g/dL (ref 32.0–36.0)
MCV: 88.9 fL (ref 80.0–100.0)
MPV: 10.3 fL (ref 7.5–12.5)
PLATELETS: 144 10*3/uL (ref 140–400)
RBC: 3.78 10*6/uL — ABNORMAL LOW (ref 3.80–5.10)
RDW: 13 % (ref 11.0–15.0)
WBC: 3.6 10*3/uL — AB (ref 3.8–10.8)

## 2017-07-07 LAB — T-HELPER CELL (CD4) - (RCID CLINIC ONLY)
CD4 % Helper T Cell: 3 % — ABNORMAL LOW (ref 33–55)
CD4 T CELL ABS: 60 /uL — AB (ref 400–2700)

## 2017-07-13 LAB — HIV-1 INTEGRASE GENOTYPE

## 2017-07-19 ENCOUNTER — Ambulatory Visit: Payer: Medicare Other | Admitting: Internal Medicine

## 2017-07-19 ENCOUNTER — Ambulatory Visit (INDEPENDENT_AMBULATORY_CARE_PROVIDER_SITE_OTHER): Payer: Medicare Other | Admitting: Internal Medicine

## 2017-07-19 DIAGNOSIS — K029 Dental caries, unspecified: Secondary | ICD-10-CM | POA: Diagnosis not present

## 2017-07-19 DIAGNOSIS — R634 Abnormal weight loss: Secondary | ICD-10-CM | POA: Diagnosis not present

## 2017-07-19 DIAGNOSIS — F325 Major depressive disorder, single episode, in full remission: Secondary | ICD-10-CM | POA: Diagnosis not present

## 2017-07-19 DIAGNOSIS — B20 Human immunodeficiency virus [HIV] disease: Secondary | ICD-10-CM

## 2017-07-19 MED ORDER — FLUCONAZOLE 100 MG PO TABS: 100.0000 mg | ORAL_TABLET | ORAL | 11 refills | Status: DC

## 2017-07-19 MED ORDER — DARUN-COBIC-EMTRICIT-TENOFAF 800-150-200-10 MG PO TABS: 1.0000 | ORAL_TABLET | Freq: Every day | ORAL | 11 refills | Status: DC

## 2017-07-19 MED ORDER — DOLUTEGRAVIR SODIUM 50 MG PO TABS: 50.0000 mg | ORAL_TABLET | Freq: Every day | ORAL | 11 refills | Status: DC

## 2017-07-19 MED ORDER — SULFAMETHOXAZOLE-TRIMETHOPRIM 800-160 MG PO TABS: 1.0000 | ORAL_TABLET | Freq: Every day | ORAL | 11 refills | Status: DC

## 2017-07-19 MED FILL — TIVICAY 50 MG TABLET: 50 | 30 days supply | Qty: 30 | Fill #0

## 2017-07-19 NOTE — Assessment & Plan Note (Signed)
We will refer her to the dental clinic.

## 2017-07-19 NOTE — Assessment & Plan Note (Signed)
Not surprisingly her viral load has reactivated and her CD4 has declined since stopping antiretroviral therapy several years ago. I'm still waiting on her recent genotype resistance assay but expected to show similar mutations to her previous assays. She has no integrase resistance. I will start her on Symtuza and Tivicay and see her back in 2 weeks. Given her CD4 count of 60 I will also start her on prophylactic trimethoprim sulfamethoxazole and fluconazole.

## 2017-07-19 NOTE — Assessment & Plan Note (Signed)
I suspect that her unintentional weight loss is multifactorial and related to her poor dentition and advanced HIV infection.

## 2017-07-19 NOTE — Progress Notes (Signed)
HPI: Alison Ruiz is a 83 y.o. female who is finally here to see Dr. Megan Salon for her HIV follow up.   Allergies: No Known Allergies  Vitals: Temp: 98.5 F (36.9 C) (10/29 1503) Temp Source: Oral (10/29 1503) BP: 137/88 (10/29 1503) Pulse Rate: 90 (10/29 1503)  Past Medical History: No past medical history on file.  Social History: Social History   Social History  . Marital status: Widowed    Spouse name: N/A  . Number of children: N/A  . Years of education: N/A   Social History Main Topics  . Smoking status: Never Smoker  . Smokeless tobacco: Never Used  . Alcohol use No  . Drug use: No  . Sexual activity: No   Other Topics Concern  . Not on file   Social History Narrative  . No narrative on file    Previous Regimen: CBV/TDF/Lexiva/r, CBV/ETR/DRV/r, RAL  Current Regimen: None  Labs: HIV 1 RNA Quant  Date Value  07/05/2017 73,600 Copies/mL (H)  05/25/2013 <20 copies/mL  12/29/2012 48,089 copies/mL (H)   HIV-1 RNA Viral Load (no units)  Date Value  10/15/2011 <40  07/16/2011 <40   CD4 (no units)  Date Value  10/15/2011 461  07/16/2011 421   CD4 T Cell Abs  Date Value  07/05/2017 60 /uL (L)  05/25/2013 250 /uL (L)  12/29/2012 260 cmm (L)   Hep B S Ab (no units)  Date Value  11/15/2006 Yes   Hepatitis B Surface Ag (no units)  Date Value  04/28/2012 NEGATIVE   HCV Ab (no units)  Date Value  04/28/2012 NEGATIVE    CrCl: CrCl cannot be calculated (Unknown ideal weight.).  Lipids:    Component Value Date/Time   CHOL 161 12/29/2012 1025   TRIG 58 12/29/2012 1025   HDL 63 12/29/2012 1025   CHOLHDL 2.6 12/29/2012 1025   VLDL 12 12/29/2012 1025   LDLCALC 86 12/29/2012 1025    Assessment: Jaline has not f/u with [place] since 2014. She has been off of therapy since then after finding out that she could not afford the copay. At the time, the copay as well over $1000. She paid it for about 3 months before stopping. She has had multiple  resistance mutations over the years. See chart on 07/05/17. After combing through her hx, we will place her on Symtuva/DTG. This will give a complete full active regimen. Counseled her on the new regimen and explained to her that she should always reach out to [place] if she ever have any issue with coverage. The copay for the two new meds will be almost $2000 but betty will sign her up for the PAF to cover the copay. The meds have been forwarded to [facility] so they can mail it to her. She will received them on Wed. She was very grateful for our help to get her access to meds again.    Recommendations:  Start Symtuza/DTG daily with food F/u in 4 wks for labs  Onnie Boer, PharmD, BCPS, AAHIVP, CPP [facility] 07/19/2017, 9:43 PM

## 2017-07-19 NOTE — Assessment & Plan Note (Signed)
Her depression is in remission. 

## 2017-07-19 NOTE — Progress Notes (Signed)
Patient Active Problem List   Diagnosis Date Noted  . Human immunodeficiency virus (HIV) disease ([place]) 02/25/2007    Priority: High  . Arrhythmia 05/07/2011    Priority: Medium  . ANXIETY 04/07/2007    Priority: Medium  . Unintentional weight loss 07/05/2017  . Dyslipidemia 11/12/2011  . GRIEF REACTION, ACUTE 05/24/2008  . Depression 04/07/2007  . VENOUS INSUFFICIENCY 04/07/2007  . Dental caries 04/07/2007  . HEPATITIS B, HX OF 04/07/2007  . SYNCOPE, HX OF 04/07/2007    Patient's Medications  New Prescriptions   DARUNAVIR-COBICISCTAT-EMTRICITABINE-TENOFOVIR ALAFENAMIDE (SYMTUZA) 800-150-200-10 MG TABS    Take 1 tablet by mouth daily with breakfast.   DOLUTEGRAVIR (TIVICAY) 50 MG TABLET    Take 1 tablet (50 mg total) by mouth daily.   FLUCONAZOLE (DIFLUCAN) 100 MG TABLET    Take 1 tablet (100 mg total) by mouth once a week.   SULFAMETHOXAZOLE-TRIMETHOPRIM (BACTRIM DS,SEPTRA DS) 800-160 MG TABLET    Take 1 tablet by mouth daily.  Previous Medications   No medications on file  Modified Medications   No medications on file  Discontinued Medications   DIPHENOXYLATE-ATROPINE (LOMOTIL) 2.5-0.025 MG PER TABLET    Take 1 tablet by mouth 4 (four) times daily as needed for diarrhea or loose stools.   EMTRICITABINE-TENOFOVIR (TRUVADA) 200-300 MG PER TABLET    Take 1 tablet by mouth daily.   LORAZEPAM (ATIVAN) 0.5 MG TABLET    Take 1 tablet (0.5 mg total) by mouth 3 (three) times daily as needed.   TRAMADOL (ULTRAM) 50 MG TABLET    Take 1 tablet (50 mg total) by mouth every 8 (eight) hours as needed for pain.   VALACYCLOVIR (VALTREX) 1000 MG TABLET    Take 1 tablet (1,000 mg total) by mouth 3 (three) times daily.   ZOLPIDEM (AMBIEN) 5 MG TABLET    Take 1 tablet (5 mg total) by mouth at bedtime as needed.    Subjective: Alison Ruiz is in for her routine HIV follow-up visit. She is currently not on any medications. She has been off her HIV medicines for at least 3-1/2 years. She  is feeling well now except for problems with her few remaining teeth. She has lost her partial plate and has trouble eating. She denies feeling anxious or depressed currently.   Review of Systems: Review of Systems  Constitutional: Positive for weight loss. Negative for chills, diaphoresis, fever and malaise/fatigue.  HENT: Negative for sore throat.        As noted in history of present illness.[place]  Respiratory: Negative for cough, sputum production and shortness of breath.   Cardiovascular: Negative for chest pain.  Gastrointestinal: Negative for abdominal pain, diarrhea, heartburn, nausea and vomiting.  Genitourinary: Negative for dysuria and frequency.  Musculoskeletal: Negative for joint pain and myalgias.  Skin: Negative for rash.  Neurological: Negative for dizziness and headaches.  Psychiatric/Behavioral: Negative for depression and substance abuse. The patient is not nervous/anxious.     No past medical history on file.  Social History  Substance Use Topics  . Smoking status: Never Smoker  . Smokeless tobacco: Never Used  . Alcohol use No    No family history on file.  No Known Allergies  Health Maintenance  Topic Date Due  . TETANUS/TDAP  08[birth year]09/1952  . DEXA SCAN  08/09/1998  . PNA vac Low Risk Adult (2 of 2 - PCV13) 04/06/2008  . INFLUENZA VACCINE  04/21/2017    Objective:  Vitals:  07/19/17 1503  BP: 137/88  Pulse: 90  Temp: 98.5 F (36.9 C)  TempSrc: Oral  Weight: 85 lb (38.6 kg)   Body mass index is 18.39 kg/m.  Physical Exam  Constitutional: She is oriented to person, place, and time.  Her weight is down 15 pounds over the past few years. She is in good spirits.  HENT:  Mouth/Throat: No oropharyngeal exudate.  Her few remaining teeth are in poor condition.  Eyes: Conjunctivae are normal.  Cardiovascular: Normal rate and regular rhythm.   No murmur heard. Pulmonary/Chest: Effort normal and breath sounds normal. She has no wheezes. She has no  rales.  Abdominal: Soft. She exhibits no mass. There is no tenderness.  Musculoskeletal: Normal range of motion.  Neurological: She is alert and oriented to person, place, and time. Gait normal.  Skin: No rash noted.  Psychiatric: Mood and affect normal.    Lab Results Lab Results  Component Value Date   WBC 3.6 (L) 07/05/2017   HGB 10.9 (L) 07/05/2017   HCT 33.6 (L) 07/05/2017   MCV 88.9 07/05/2017   PLT 144 07/05/2017    Lab Results  Component Value Date   CREATININE 0.71 07/05/2017   BUN 18 07/05/2017   NA 141 07/05/2017   K 3.8 07/05/2017   CL 107 07/05/2017   CO2 26 07/05/2017    Lab Results  Component Value Date   ALT 29 07/05/2017   AST 34 07/05/2017   ALKPHOS 75 05/25/2013   BILITOT 0.3 07/05/2017    Lab Results  Component Value Date   CHOL 161 12/29/2012   HDL 63 12/29/2012   LDLCALC 86 12/29/2012   TRIG 58 12/29/2012   CHOLHDL 2.6 12/29/2012   Lab Results  Component Value Date   LABRPR NON REAC 12/29/2012   HIV 1 RNA Quant  Date Value  07/05/2017 73,600 Copies/mL (H)  05/25/2013 <20 copies/mL  12/29/2012 48,089 copies/mL (H)   HIV-1 RNA Viral Load (no units)  Date Value  10/15/2011 <40  07/16/2011 <40   CD4 (no units)  Date Value  10/15/2011 461  07/16/2011 421   CD4 T Cell Abs  Date Value  07/05/2017 60 /uL (L)  05/25/2013 250 /uL (L)  12/29/2012 260 cmm (L)     Problem List Items Addressed This Visit      High   Human immunodeficiency virus (HIV) disease ([place])    Not surprisingly her viral load has reactivated and her CD4 has declined since stopping antiretroviral therapy several years ago. I'm still waiting on her recent genotype resistance assay but expected to show similar mutations to her previous assays. She has no integrase resistance. I will start her on Symtuza and Tivicay and see her back in 2 weeks. Given her CD4 count of 60 I will also start her on prophylactic trimethoprim sulfamethoxazole and fluconazole.       Relevant Medications   Darunavir-Cobicisctat-Emtricitabine-Tenofovir Alafenamide (SYMTUZA) 800-150-200-10 MG TABS   dolutegravir (TIVICAY) 50 MG tablet   sulfamethoxazole-trimethoprim (BACTRIM DS,SEPTRA DS) 800-160 MG tablet   fluconazole (DIFLUCAN) 100 MG tablet     Unprioritized   Dental caries    We will refer her to the dental clinic.      Relevant Medications   Darunavir-Cobicisctat-Emtricitabine-Tenofovir Alafenamide (SYMTUZA) 800-150-200-10 MG TABS   dolutegravir (TIVICAY) 50 MG tablet   sulfamethoxazole-trimethoprim (BACTRIM DS,SEPTRA DS) 800-160 MG tablet   fluconazole (DIFLUCAN) 100 MG tablet   Depression    Her depression is in remission.  Unintentional weight loss    I suspect that her unintentional weight loss is multifactorial and related to her poor dentition and advanced HIV infection.           Michel Bickers, MD [facility] [place] [facility] [phone] pager   [phone] cell 07/19/2017, 3:31 PM

## 2017-07-20 LAB — HIV-1 RNA ULTRAQUANT REFLEX TO GENTYP+
HIV 1 RNA Quant: 73600 Copies/mL — ABNORMAL HIGH
HIV-1 RNA Quant, Log: 4.87 Log cps/mL — ABNORMAL HIGH

## 2017-07-20 LAB — RFLX HIV-1 INTEGRASE GENOTYPE: HIV-1 GENOTYPE: DETECTED — AB

## 2017-07-20 MED FILL — SYMTUZA 800-150-200-10 MG T: 800-150-200 | 30 days supply | Qty: 30 | Fill #0

## 2017-08-19 ENCOUNTER — Ambulatory Visit (INDEPENDENT_AMBULATORY_CARE_PROVIDER_SITE_OTHER): Payer: Medicare Other | Admitting: Internal Medicine

## 2017-08-19 ENCOUNTER — Encounter: Payer: Self-pay | Admitting: Internal Medicine

## 2017-08-19 DIAGNOSIS — Z23 Encounter for immunization: Secondary | ICD-10-CM

## 2017-08-19 DIAGNOSIS — B20 Human immunodeficiency virus [HIV] disease: Secondary | ICD-10-CM | POA: Diagnosis not present

## 2017-08-19 MED ORDER — SULFAMETHOXAZOLE-TRIMETHOPRIM 800-160 MG PO TABS: 1.0000 | ORAL_TABLET | Freq: Every day | ORAL | 11 refills | Status: DC

## 2017-08-19 MED ORDER — FLUCONAZOLE 100 MG PO TABS: 100.0000 mg | ORAL_TABLET | ORAL | 11 refills | Status: DC

## 2017-08-19 MED FILL — FLUCONAZOLE 100 MG TABLET: 100 | 28 days supply | Qty: 4 | Fill #0

## 2017-08-19 MED FILL — TIVICAY 50 MG TABLET: 50 | 30 days supply | Qty: 30 | Fill #1

## 2017-08-19 MED FILL — SYMTUZA 800-150-200-10 MG T: 800-150-200 | 30 days supply | Qty: 30 | Fill #1

## 2017-08-19 NOTE — Progress Notes (Signed)
Patient Active Problem List   Diagnosis Date Noted  . Human immunodeficiency virus (HIV) disease ([place]) 02/25/2007    Priority: High  . Arrhythmia 05/07/2011    Priority: Medium  . ANXIETY 04/07/2007    Priority: Medium  . Unintentional weight loss 07/05/2017  . Dyslipidemia 11/12/2011  . GRIEF REACTION, ACUTE 05/24/2008  . Depression 04/07/2007  . VENOUS INSUFFICIENCY 04/07/2007  . Dental caries 04/07/2007  . HEPATITIS B, HX OF 04/07/2007  . SYNCOPE, HX OF 04/07/2007      Medication List        Accurate as of 08/19/17  9:07 AM. Always use your most recent med list.          Darunavir-Cobicisctat-Emtricitabine-Tenofovir Alafenamide 800-150-200-10 MG Tabs Commonly known as:  SYMTUZA Take 1 tablet by mouth daily with breakfast.   dolutegravir 50 MG tablet Commonly known as:  TIVICAY Take 1 tablet (50 mg total) by mouth daily.   fluconazole 100 MG tablet Commonly known as:  DIFLUCAN Take 1 tablet (100 mg total) by mouth once a week.   sulfamethoxazole-trimethoprim 800-160 MG tablet Commonly known as:  BACTRIM DS,SEPTRA DS Take 1 tablet by mouth daily.       Where to Get Your Medications    These medications were sent to [facility], [address]  [place], [place] [address]   Phone:  [phone]   fluconazole 100 MG tablet  sulfamethoxazole-trimethoprim 800-160 MG tablet     Subjective: Alison Ruiz is in for her routine HIV follow-up visit. She has her Symtuza and Tivicay bottles with her. She takes them each morning. She has not had any side effects. She does not recall missing any doses It does not appear that she has her trimethoprim sulfamethoxazole or fluconazole.   Review of Systems: Review of Systems  Gastrointestinal: Negative for abdominal pain, diarrhea, nausea and vomiting.    No past medical history on file.  Social History   Tobacco Use  . Smoking status: Never  Smoker  . Smokeless tobacco: Never Used  Substance Use Topics  . Alcohol use: No  . Drug use: No    No family history on file.  No Known Allergies  Health Maintenance  Topic Date Due  . TETANUS/TDAP  08/09/1950  . DEXA SCAN  08/09/1998  . PNA vac Low Risk Adult (2 of 2 - PCV13) 04/06/2008  . INFLUENZA VACCINE  04/21/2017    Objective:  Vitals:   08/19/17 0845  BP: 135/75  Pulse: 68  Temp: 97.8 F (36.6 C)  TempSrc: Oral  Weight: 89 lb (40.4 kg)  Height: 5' (1.524 m)   Body mass index is 17.38 kg/m.  Physical Exam  Constitutional: She is oriented to person, place, and time. No distress.  Neurological: She is alert and oriented to person, place, and time.  Psychiatric: Mood and affect normal.    Lab Results Lab Results  Component Value Date   WBC 3.6 (L) 07/05/2017   HGB 10.9 (L) 07/05/2017   HCT 33.6 (L) 07/05/2017   MCV 88.9 07/05/2017   PLT 144 07/05/2017    Lab Results  Component Value Date   CREATININE 0.71 07/05/2017   BUN 18 07/05/2017   NA 141 07/05/2017   K 3.8 07/05/2017   CL 107 07/05/2017   CO2 26 07/05/2017    Lab Results  Component Value Date   ALT 29 07/05/2017   AST 34  07/05/2017   ALKPHOS 75 05/25/2013   BILITOT 0.3 07/05/2017    Lab Results  Component Value Date   CHOL 161 12/29/2012   HDL 63 12/29/2012   LDLCALC 86 12/29/2012   TRIG 58 12/29/2012   CHOLHDL 2.6 12/29/2012   Lab Results  Component Value Date   LABRPR NON REAC 12/29/2012   HIV 1 RNA Quant  Date Value  07/05/2017 73,600 Copies/mL (H)  05/25/2013 <20 copies/mL  12/29/2012 48,089 copies/mL (H)   HIV-1 RNA Viral Load (no units)  Date Value  10/15/2011 <40  07/16/2011 <40   CD4 (no units)  Date Value  10/15/2011 461  07/16/2011 421   CD4 T Cell Abs  Date Value  07/05/2017 60 /uL (L)  05/25/2013 250 /uL (L)  12/29/2012 260 cmm (L)     Problem List Items Addressed This Visit      High   Human immunodeficiency virus (HIV) disease ([place])    It  appears that she is off to a good start with her new salvage regimen. Her trimethoprim sulfamethoxazole and fluconazole have been sent to [facility]. All 4 of her medications will be mailed to her. I will get repeat lab work today and see her back in 2-3 weeks.      Relevant Medications   fluconazole (DIFLUCAN) 100 MG tablet   sulfamethoxazole-trimethoprim (BACTRIM DS,SEPTRA DS) 800-160 MG tablet   Other Relevant Orders   T-helper cell (CD4)- ([facility] clinic only)   HIV 1 RNA quant-no reflex-bld        Michel Bickers, MD [facility] [place] [facility] [phone] pager   [phone] cell 08/19/2017, 9:07 AM

## 2017-08-19 NOTE — Assessment & Plan Note (Signed)
It appears that she is off to a good start with her new salvage regimen. Her trimethoprim sulfamethoxazole and fluconazole have been sent to [facility]. All 4 of her medications will be mailed to her. I will get repeat lab work today and see her back in 2-3 weeks.

## 2017-08-20 LAB — T-HELPER CELL (CD4) - (RCID CLINIC ONLY)
CD4 % Helper T Cell: 4 % — ABNORMAL LOW (ref 33–55)
CD4 T CELL ABS: 100 /uL — AB (ref 400–2700)

## 2017-08-23 LAB — HIV-1 RNA QUANT-NO REFLEX-BLD
HIV 1 RNA Quant: 180 copies/mL — ABNORMAL HIGH
HIV-1 RNA Quant, Log: 2.26 Log copies/mL — ABNORMAL HIGH

## 2017-08-23 MED FILL — SULFAMETHOXAZOLE-TMP DS TAB: 800-160 | 30 days supply | Qty: 30 | Fill #0

## 2017-09-09 ENCOUNTER — Ambulatory Visit (INDEPENDENT_AMBULATORY_CARE_PROVIDER_SITE_OTHER): Payer: Medicare Other | Admitting: Internal Medicine

## 2017-09-09 ENCOUNTER — Encounter: Payer: Self-pay | Admitting: Internal Medicine

## 2017-09-09 DIAGNOSIS — K029 Dental caries, unspecified: Secondary | ICD-10-CM

## 2017-09-09 DIAGNOSIS — B20 Human immunodeficiency virus [HIV] disease: Secondary | ICD-10-CM

## 2017-09-09 NOTE — Progress Notes (Signed)
Patient Active Problem List   Diagnosis Date Noted  . Human immunodeficiency virus (HIV) disease ([place]) 02/25/2007    Priority: High  . Arrhythmia 05/07/2011    Priority: Medium  . ANXIETY 04/07/2007    Priority: Medium  . Unintentional weight loss 07/05/2017  . Dyslipidemia 11/12/2011  . GRIEF REACTION, ACUTE 05/24/2008  . Depression 04/07/2007  . VENOUS INSUFFICIENCY 04/07/2007  . Dental caries 04/07/2007  . HEPATITIS B, HX OF 04/07/2007  . SYNCOPE, HX OF 04/07/2007      Medication List        Accurate as of 09/09/17 11:45 AM. Always use your most recent med list.          Darunavir-Cobicisctat-Emtricitabine-Tenofovir Alafenamide 800-150-200-10 MG Tabs Commonly known as:  SYMTUZA Take 1 tablet by mouth daily with breakfast.   dolutegravir 50 MG tablet Commonly known as:  TIVICAY Take 1 tablet (50 mg total) by mouth daily.   fluconazole 100 MG tablet Commonly known as:  DIFLUCAN Take 1 tablet (100 mg total) by mouth once a week.   sulfamethoxazole-trimethoprim 800-160 MG tablet Commonly known as:  BACTRIM DS,SEPTRA DS Take 1 tablet by mouth daily.       Subjective: Alison Ruiz is in for her routine HIV follow-up visit.  She has had no problems getting her Symtuza or Tivicay.  She is also taking her trimethoprim sulfamethoxazole and fluconazole.  She does not know the names of her medications but can pick them off the chart.  She was having some problems swallowing Symtuza but this has not been a problem since she started taking it with a cup of juice.  She does not think she has missed doses.  She is feeling better.  She has much more energy since starting back on her medications and her appetite is improved.  Review of Systems: Review of Systems  Constitutional: Negative for chills, diaphoresis, fever, malaise/fatigue and weight loss.  HENT: Negative for sore throat.        She has not been seen in the dental clinic yet.  Respiratory: Negative for  cough, sputum production and shortness of breath.   Cardiovascular: Negative for chest pain.  Gastrointestinal: Negative for abdominal pain, diarrhea, heartburn, nausea and vomiting.  Genitourinary: Negative for dysuria and frequency.  Musculoskeletal: Negative for joint pain and myalgias.  Skin: Negative for rash.  Neurological: Negative for dizziness and headaches.  Psychiatric/Behavioral: Negative for depression and substance abuse. The patient is not nervous/anxious.     No past medical history on file.  Social History   Tobacco Use  . Smoking status: Never Smoker  . Smokeless tobacco: Never Used  Substance Use Topics  . Alcohol use: No  . Drug use: No    No family history on file.  No Known Allergies  Health Maintenance  Topic Date Due  . TETANUS/TDAP  08/09/1950  . DEXA SCAN  08/09/1998  . PNA vac Low Risk Adult (2 of 2 - PCV13) 04/06/2008  . INFLUENZA VACCINE  Completed    Objective:  Vitals:   09/09/17 1102  BP: (!) 171/80  Pulse: 65  Temp: 98.7 F (37.1 C)  TempSrc: Oral  Weight: 89 lb (40.4 kg)  Height: 5' (1.524 m)   Body mass index is 17.38 kg/m.  Physical Exam  Constitutional: She is oriented to person, place, and time.  She is in very good spirits today.  Her weight is up 4 pounds.  HENT:  Mouth/Throat: No oropharyngeal exudate.  Few remaining teeth.  Eyes: Conjunctivae are normal.  Cardiovascular: Normal rate and regular rhythm.  No murmur heard. Pulmonary/Chest: Breath sounds normal.  Abdominal: Soft. She exhibits no mass. There is no tenderness.  Musculoskeletal: Normal range of motion.  Neurological: She is alert and oriented to person, place, and time.  Skin: No rash noted.  Psychiatric: Mood and affect normal.    Lab Results Lab Results  Component Value Date   WBC 3.6 (L) 07/05/2017   HGB 10.9 (L) 07/05/2017   HCT 33.6 (L) 07/05/2017   MCV 88.9 07/05/2017   PLT 144 07/05/2017    Lab Results  Component Value Date    CREATININE 0.71 07/05/2017   BUN 18 07/05/2017   NA 141 07/05/2017   K 3.8 07/05/2017   CL 107 07/05/2017   CO2 26 07/05/2017    Lab Results  Component Value Date   ALT 29 07/05/2017   AST 34 07/05/2017   ALKPHOS 75 05/25/2013   BILITOT 0.3 07/05/2017    Lab Results  Component Value Date   CHOL 161 12/29/2012   HDL 63 12/29/2012   LDLCALC 86 12/29/2012   TRIG 58 12/29/2012   CHOLHDL 2.6 12/29/2012   Lab Results  Component Value Date   LABRPR NON REAC 12/29/2012   HIV 1 RNA Quant  Date Value  08/19/2017 180 copies/mL (H)  07/05/2017 73,600 Copies/mL (H)  05/25/2013 <20 copies/mL   HIV-1 RNA Viral Load (no units)  Date Value  10/15/2011 <40  07/16/2011 <40   CD4 (no units)  Date Value  10/15/2011 461  07/16/2011 421   CD4 T Cell Abs (/uL)  Date Value  08/19/2017 100 (L)  07/05/2017 60 (L)  05/25/2013 250 (L)     Problem List Items Addressed This Visit      High   Human immunodeficiency virus (HIV) disease ([place])    Her infection is coming under better control since restarting therapy 2 months ago.  She will follow-up after lab work in 3 months.        Unprioritized   Dental caries    She has an appointment to be seen in the dental clinic on 09/23/2017.           Michel Bickers, MD [facility] [place] [facility] [phone] pager   [phone] cell 09/09/2017, 11:45 AM

## 2017-09-09 NOTE — Assessment & Plan Note (Signed)
She has an appointment to be seen in the dental clinic on 09/23/2017.

## 2017-09-09 NOTE — Assessment & Plan Note (Signed)
Her infection is coming under better control since restarting therapy 2 months ago.  She will follow-up after lab work in 3 months.

## 2017-09-15 MED FILL — FLUCONAZOLE 100 MG TABLET: 100 | 28 days supply | Qty: 4 | Fill #1

## 2017-09-15 MED FILL — TIVICAY 50 MG TABLET: 50 | 30 days supply | Qty: 30 | Fill #2

## 2017-09-15 MED FILL — SYMTUZA 800-150-200-10 MG T: 800-150-200 | 30 days supply | Qty: 30 | Fill #2

## 2017-09-15 MED FILL — SULFAMETHOXAZOLE-TMP DS TAB: 800-160 | 30 days supply | Qty: 30 | Fill #1

## 2017-11-10 ENCOUNTER — Telehealth: Payer: Self-pay | Admitting: Pharmacy Technician

## 2017-11-10 ENCOUNTER — Telehealth: Payer: Self-pay | Admitting: *Deleted

## 2017-11-10 ENCOUNTER — Ambulatory Visit: Payer: Medicare Other | Admitting: *Deleted

## 2017-11-10 NOTE — Telephone Encounter (Signed)
After traveling to the home and being unable to see Alison Ruiz, I made a call and had to leave a message. Message left stating "my name RN and that I am calling from the Dr's office to be sure she is doing well' Asked for a return call at [phone]

## 2017-11-10 NOTE — Telephone Encounter (Signed)
[facility] and I have, in total, left 5 voicemails with Alison Ruiz asking for her to call us about getting her a refill.  No response to date.

## 2017-11-11 ENCOUNTER — Ambulatory Visit: Payer: Self-pay | Admitting: *Deleted

## 2017-11-11 DIAGNOSIS — B20 Human immunodeficiency virus [HIV] disease: Secondary | ICD-10-CM

## 2017-11-22 ENCOUNTER — Telehealth: Payer: Self-pay | Admitting: *Deleted

## 2017-11-22 NOTE — Telephone Encounter (Signed)
Emergency contact person called today with a VM left stating my name and that I am a RN trying to get in contact with Alison Ruiz. Advised that all is well so please do not worry,  I just need to get in contact with Alison Ruiz.

## 2017-11-22 NOTE — Telephone Encounter (Signed)
Contacted the patient to offer services with medication adherence. Phone call went straight to VM. Left a message stating my name and that I am the nurse that came to her home. Asked for a return call.

## 2017-12-09 NOTE — Progress Notes (Signed)
Pharmacy have been unable to reach the patient and I have received a referral to assist. Unable to reach Alison Ruiz by phone so I drove to her home. Arrived at the home and after knocking on the door for a while the neighbor across the street came to the home and informed me that the patient has 2 cars and one care is missing. The neighbor stated Alison Ruiz is not home.

## 2017-12-09 NOTE — Progress Notes (Signed)
Pharmacy have been unable to reach the patient and I have received a referral to assist. Unable to reach Alison Ruiz by phone so I drove to her home. Arrived at the home and did not receive an answer to a locked door. 1 car is still missing. VM stating I came to the home have been let on the patient's VM.

## 2017-12-23 ENCOUNTER — Ambulatory Visit: Payer: Medicare Other | Admitting: Internal Medicine

## 2017-12-27 MED FILL — FLUCONAZOLE 100 MG TABLET: 100 | 28 days supply | Qty: 4 | Fill #2

## 2017-12-27 MED FILL — SULFAMETHOXAZOLE-TMP DS TAB: 800-160 | 30 days supply | Qty: 30 | Fill #2

## 2017-12-27 MED FILL — SYMTUZA 800-150-200-10 MG T: 800-150-200 | 30 days supply | Qty: 30 | Fill #3

## 2017-12-27 MED FILL — TIVICAY 50 MG TABLET: 50 | 30 days supply | Qty: 30 | Fill #3

## 2018-02-16 ENCOUNTER — Telehealth: Payer: Self-pay | Admitting: Pharmacist Clinician (PhC)/ Clinical Pharmacy Specialist

## 2018-02-16 NOTE — Telephone Encounter (Signed)
Still can't contact with her. She missed the last appt with Dr. Megan Salon. Pharmacy said that she is 21 days overdue.

## 2018-12-21 DIAGNOSIS — R4020 Unspecified coma: Secondary | ICD-10-CM

## 2018-12-21 DIAGNOSIS — N179 Acute kidney failure, unspecified: Secondary | ICD-10-CM

## 2018-12-21 HISTORY — DX: Acute kidney failure, unspecified: N17.9

## 2018-12-21 HISTORY — DX: Unspecified coma: R40.20

## 2019-01-17 ENCOUNTER — Encounter (HOSPITAL_COMMUNITY): Payer: Self-pay

## 2019-01-17 ENCOUNTER — Emergency Department (HOSPITAL_COMMUNITY): Payer: Medicare Other

## 2019-01-17 ENCOUNTER — Inpatient Hospital Stay (HOSPITAL_COMMUNITY)
Admission: EM | Admit: 2019-01-17 | Discharge: 2019-01-24 | DRG: 977 | Disposition: A | Discharge status: Home Health | Payer: Medicare Other | Attending: Family Medicine | Admitting: Family Medicine

## 2019-01-17 DIAGNOSIS — R4020 Unspecified coma: Secondary | ICD-10-CM

## 2019-01-17 DIAGNOSIS — E785 Hyperlipidemia, unspecified: Secondary | ICD-10-CM | POA: Diagnosis present

## 2019-01-17 DIAGNOSIS — R634 Abnormal weight loss: Secondary | ICD-10-CM | POA: Diagnosis present

## 2019-01-17 DIAGNOSIS — I951 Orthostatic hypotension: Secondary | ICD-10-CM | POA: Diagnosis present

## 2019-01-17 DIAGNOSIS — Z20828 Contact with and (suspected) exposure to other viral communicable diseases: Secondary | ICD-10-CM | POA: Diagnosis present

## 2019-01-17 DIAGNOSIS — R64 Cachexia: Secondary | ICD-10-CM | POA: Diagnosis present

## 2019-01-17 DIAGNOSIS — R636 Underweight: Secondary | ICD-10-CM

## 2019-01-17 DIAGNOSIS — D509 Iron deficiency anemia, unspecified: Secondary | ICD-10-CM | POA: Diagnosis present

## 2019-01-17 DIAGNOSIS — E860 Dehydration: Principal | ICD-10-CM | POA: Diagnosis present

## 2019-01-17 DIAGNOSIS — I447 Left bundle-branch block, unspecified: Secondary | ICD-10-CM | POA: Diagnosis present

## 2019-01-17 DIAGNOSIS — E8809 Other disorders of plasma-protein metabolism, not elsewhere classified: Secondary | ICD-10-CM | POA: Diagnosis present

## 2019-01-17 DIAGNOSIS — T375X6A Underdosing of antiviral drugs, initial encounter: Secondary | ICD-10-CM | POA: Diagnosis present

## 2019-01-17 DIAGNOSIS — B20 Human immunodeficiency virus [HIV] disease: Secondary | ICD-10-CM | POA: Diagnosis present

## 2019-01-17 DIAGNOSIS — I499 Cardiac arrhythmia, unspecified: Secondary | ICD-10-CM | POA: Diagnosis present

## 2019-01-17 DIAGNOSIS — Z681 Body mass index (BMI) 19 or less, adult: Secondary | ICD-10-CM

## 2019-01-17 DIAGNOSIS — E162 Hypoglycemia, unspecified: Secondary | ICD-10-CM | POA: Diagnosis present

## 2019-01-17 DIAGNOSIS — N179 Acute kidney failure, unspecified: Secondary | ICD-10-CM | POA: Diagnosis present

## 2019-01-17 DIAGNOSIS — R55 Syncope and collapse: Secondary | ICD-10-CM | POA: Diagnosis not present

## 2019-01-17 DIAGNOSIS — J90 Pleural effusion, not elsewhere classified: Secondary | ICD-10-CM | POA: Diagnosis present

## 2019-01-17 DIAGNOSIS — K029 Dental caries, unspecified: Secondary | ICD-10-CM | POA: Diagnosis present

## 2019-01-17 DIAGNOSIS — E43 Unspecified severe protein-calorie malnutrition: Secondary | ICD-10-CM

## 2019-01-17 DIAGNOSIS — Z8619 Personal history of other infectious and parasitic diseases: Secondary | ICD-10-CM

## 2019-01-17 DIAGNOSIS — I872 Venous insufficiency (chronic) (peripheral): Secondary | ICD-10-CM | POA: Diagnosis present

## 2019-01-17 DIAGNOSIS — R509 Fever, unspecified: Secondary | ICD-10-CM | POA: Diagnosis not present

## 2019-01-17 DIAGNOSIS — Z9114 Patient's other noncompliance with medication regimen: Secondary | ICD-10-CM

## 2019-01-17 DIAGNOSIS — Z91120 Patient's intentional underdosing of medication regimen due to financial hardship: Secondary | ICD-10-CM

## 2019-01-17 DIAGNOSIS — F418 Other specified anxiety disorders: Secondary | ICD-10-CM | POA: Diagnosis present

## 2019-01-17 DIAGNOSIS — R748 Abnormal levels of other serum enzymes: Secondary | ICD-10-CM | POA: Diagnosis present

## 2019-01-17 DIAGNOSIS — R001 Bradycardia, unspecified: Secondary | ICD-10-CM | POA: Diagnosis present

## 2019-01-17 DIAGNOSIS — Z830 Family history of human immunodeficiency virus [HIV] disease: Secondary | ICD-10-CM

## 2019-01-17 DIAGNOSIS — R0902 Hypoxemia: Secondary | ICD-10-CM | POA: Diagnosis not present

## 2019-01-17 HISTORY — DX: Human immunodeficiency virus (HIV) disease: B20

## 2019-01-17 HISTORY — DX: Adjustment disorder with depressed mood: F43.21

## 2019-01-17 HISTORY — DX: Unspecified coma: R40.20

## 2019-01-17 HISTORY — DX: Abnormal levels of other serum enzymes: R74.8

## 2019-01-17 HISTORY — DX: Acute kidney failure, unspecified: N17.9

## 2019-01-17 LAB — CBC WITH DIFFERENTIAL/PLATELET
Abs Immature Granulocytes: 0.03 10*3/uL (ref 0.00–0.07)
Basophils Absolute: 0 10*3/uL (ref 0.0–0.1)
Basophils Relative: 0 %
Eosinophils Absolute: 0 10*3/uL (ref 0.0–0.5)
Eosinophils Relative: 0 %
HCT: 36.7 % (ref 36.0–46.0)
Hemoglobin: 11.2 g/dL — ABNORMAL LOW (ref 12.0–15.0)
Immature Granulocytes: 1 %
Lymphocytes Relative: 8 %
Lymphs Abs: 0.3 10*3/uL — ABNORMAL LOW (ref 0.7–4.0)
MCH: 27.4 pg (ref 26.0–34.0)
MCHC: 30.5 g/dL (ref 30.0–36.0)
MCV: 89.7 fL (ref 80.0–100.0)
Monocytes Absolute: 0.4 10*3/uL (ref 0.1–1.0)
Monocytes Relative: 10 %
Neutro Abs: 3.3 10*3/uL (ref 1.7–7.7)
Neutrophils Relative %: 81 %
Platelets: 178 10*3/uL (ref 150–400)
RBC: 4.09 MIL/uL (ref 3.87–5.11)
RDW: 15.8 % — ABNORMAL HIGH (ref 11.5–15.5)
WBC: 4 10*3/uL (ref 4.0–10.5)
nRBC: 0 % (ref 0.0–0.2)

## 2019-01-17 LAB — CBC
HCT: 35.1 % — ABNORMAL LOW (ref 36.0–46.0)
Hemoglobin: 11.1 g/dL — ABNORMAL LOW (ref 12.0–15.0)
MCH: 27.4 pg (ref 26.0–34.0)
MCHC: 31.6 g/dL (ref 30.0–36.0)
MCV: 86.7 fL (ref 80.0–100.0)
Platelets: 195 10*3/uL (ref 150–400)
RBC: 4.05 MIL/uL (ref 3.87–5.11)
RDW: 15.5 % (ref 11.5–15.5)
WBC: 4.1 10*3/uL (ref 4.0–10.5)
nRBC: 0 % (ref 0.0–0.2)

## 2019-01-17 LAB — COMPREHENSIVE METABOLIC PANEL
ALT: 36 U/L (ref 0–44)
AST: 58 U/L — ABNORMAL HIGH (ref 15–41)
Albumin: 2.4 g/dL — ABNORMAL LOW (ref 3.5–5.0)
Alkaline Phosphatase: 293 U/L — ABNORMAL HIGH (ref 38–126)
Anion gap: 13 (ref 5–15)
BUN: 25 mg/dL — ABNORMAL HIGH (ref 8–23)
CO2: 20 mmol/L — ABNORMAL LOW (ref 22–32)
Calcium: 9 mg/dL (ref 8.9–10.3)
Chloride: 105 mmol/L (ref 98–111)
Creatinine, Ser: 1.19 mg/dL — ABNORMAL HIGH (ref 0.44–1.00)
GFR calc Af Amer: 48 mL/min — ABNORMAL LOW (ref >60)
GFR calc non Af Amer: 42 mL/min — ABNORMAL LOW (ref >60)
Glucose, Bld: 289 mg/dL — ABNORMAL HIGH (ref 70–99)
Potassium: 4.1 mmol/L (ref 3.5–5.1)
Sodium: 138 mmol/L (ref 135–145)
Total Bilirubin: 0.6 mg/dL (ref 0.3–1.2)
Total Protein: 6.6 g/dL (ref 6.5–8.1)

## 2019-01-17 LAB — URINALYSIS, ROUTINE W REFLEX MICROSCOPIC
Bilirubin Urine: NEGATIVE
Glucose, UA: >=500 mg/dL — AB
Ketones, ur: NEGATIVE mg/dL
Leukocytes,Ua: NEGATIVE
Nitrite: NEGATIVE
Protein, ur: NEGATIVE mg/dL
Specific Gravity, Urine: 1.012 (ref 1.005–1.030)
pH: 5 (ref 5.0–8.0)

## 2019-01-17 LAB — RETICULOCYTES
Immature Retic Fract: 19.4 % — ABNORMAL HIGH (ref 2.3–15.9)
RBC.: 4.05 MIL/uL (ref 3.87–5.11)
Retic Count, Absolute: 53.5 10*3/uL (ref 19.0–186.0)
Retic Ct Pct: 1.3 % (ref 0.4–3.1)

## 2019-01-17 LAB — FERRITIN: Ferritin: 480 ng/mL — ABNORMAL HIGH (ref 11–307)

## 2019-01-17 LAB — FOLATE: Folate: 14.1 ng/mL (ref >5.9)

## 2019-01-17 LAB — CBG MONITORING, ED
Glucose-Capillary: 270 mg/dL — ABNORMAL HIGH (ref 70–99)
Glucose-Capillary: 44 mg/dL — CL (ref 70–99)
Glucose-Capillary: 267 mg/dL — ABNORMAL HIGH (ref 70–99)
Glucose-Capillary: 120 mg/dL — ABNORMAL HIGH (ref 70–99)

## 2019-01-17 LAB — IRON AND TIBC
Iron: 17 ug/dL — ABNORMAL LOW (ref 28–170)
Saturation Ratios: 6 % — ABNORMAL LOW (ref 10.4–31.8)
TIBC: 307 ug/dL (ref 250–450)
UIBC: 290 ug/dL

## 2019-01-17 LAB — HEMOGLOBIN A1C
Hgb A1c MFr Bld: 6.5 % — ABNORMAL HIGH (ref 4.8–5.6)
Mean Plasma Glucose: 139.85 mg/dL

## 2019-01-17 LAB — CREATININE, SERUM
Creatinine, Ser: 1.03 mg/dL — ABNORMAL HIGH (ref 0.44–1.00)
GFR calc Af Amer: 57 mL/min — ABNORMAL LOW (ref >60)
GFR calc non Af Amer: 50 mL/min — ABNORMAL LOW (ref >60)

## 2019-01-17 LAB — TROPONIN I: Troponin I: <0.03 ng/mL (ref <0.03)

## 2019-01-17 LAB — GLUCOSE, CAPILLARY: Glucose-Capillary: 138 mg/dL — ABNORMAL HIGH (ref 70–99)

## 2019-01-17 LAB — VITAMIN B12: Vitamin B-12: 266 pg/mL (ref 180–914)

## 2019-01-17 LAB — TSH: TSH: 2.239 u[IU]/mL (ref 0.350–4.500)

## 2019-01-17 MED ORDER — ONDANSETRON HCL 4 MG/2ML IJ SOLN: 4.0000 mg | Freq: Four times a day (QID) | INTRAMUSCULAR | Status: DC | PRN

## 2019-01-17 MED ORDER — ENOXAPARIN SODIUM 30 MG/0.3ML ~~LOC~~ SOLN
30.0000 mg | SUBCUTANEOUS | Status: DC
  Administered 2019-01-17 – 2019-01-20 (×4): 30 mg via SUBCUTANEOUS
  Filled 2019-01-17 (×4): qty 0.3

## 2019-01-17 MED ORDER — DEXTROSE 10 % IV SOLN: INTRAVENOUS | Status: DC

## 2019-01-17 MED ORDER — ACETAMINOPHEN 650 MG RE SUPP: 650.0000 mg | Freq: Four times a day (QID) | RECTAL | Status: DC | PRN

## 2019-01-17 MED ORDER — DEXTROSE 50 % IV SOLN
INTRAVENOUS | Status: AC
  Administered 2019-01-17: 14:00:00
  Filled 2019-01-17: qty 50

## 2019-01-17 MED ORDER — ACETAMINOPHEN 325 MG PO TABS
650.0000 mg | ORAL_TABLET | Freq: Four times a day (QID) | ORAL | Status: DC | PRN
  Administered 2019-01-19: 650 mg via ORAL
  Filled 2019-01-17: qty 2

## 2019-01-17 MED ORDER — SODIUM CHLORIDE 0.9 % IV SOLN
INTRAVENOUS | Status: DC
  Administered 2019-01-17: 22:00:00 via INTRAVENOUS

## 2019-01-17 MED ORDER — ONDANSETRON HCL 4 MG PO TABS: 4.0000 mg | ORAL_TABLET | Freq: Four times a day (QID) | ORAL | Status: DC | PRN

## 2019-01-17 NOTE — ED Notes (Signed)
ED TO INPATIENT HANDOFF REPORT  ED Nurse Name and Phone #: Jaleil Renwick [phone]  S Name/Age/Gender Alison Ruiz 85 y.o. female Room/Bed: 035C/035C  Code Status   Code Status: Not on file  Home/SNF/Other Home Patient oriented to: self, place, time and situation Is this baseline? Yes   Triage Complete: Triage complete  Chief Complaint Syncope  Triage Note Pt brought in by ems for c/o syncopal episode that occurred while she was working at [employer]; co workers stated that she noticed the patient became unsteady on her feet and lowered her to the ground , after that patient became unresponsive and began having trouble breathing ; upon ems arrival ,patient had weak radial pulses and was still unresponsive ; once they put her in the truck she became responsive ; CBG 162 ; pt alert and oriented x 4 and following simple commands ; pt grip strength equals    Allergies No Known Allergies  Level of Care/Admitting Diagnosis ED Disposition    ED Disposition Condition [place]: [place] [[id number]]  Level of Care: Telemetry Cardiac [103]  Covid Evaluation: N/A  Diagnosis: Syncope [960454]  Admitting Physician: Patriciaann Clan [[id number]]  Attending Physician: MCDIARMID, TODD D [[id number]  PT Class (Do Not Modify): Observation [104]  PT Acc Code (Do Not Modify): Observation [10022]       B Medical/Surgery History Past Medical History:  Diagnosis Date  . HIV (human immunodeficiency virus infection) ([place])    Past Surgical History:  Procedure Laterality Date  . ABDOMINAL HYSTERECTOMY       A IV Location/Drains/Wounds Patient Lines/Drains/Airways Status   Active Line/Drains/Airways    Name:   Placement date:   Placement time:   Site:   Days:   Peripheral IV 01/17/19 Left Antecubital   01/17/19    -    Antecubital   less than 1          Intake/Output Last 24 hours No intake or output data in the 24 hours ending 01/17/19  1758  Labs/Imaging Results for orders placed or performed during the hospital encounter of 01/17/19 (from the past 48 hour(s))  CBG monitoring, ED     Status: Abnormal   Collection Time: 01/17/19  1:52 PM  Result Value Ref Range   Glucose-Capillary 44 (LL) 70 - 99 mg/dL   Comment 1 Notify RN   Comprehensive metabolic panel     Status: Abnormal   Collection Time: 01/17/19  2:04 PM  Result Value Ref Range   Sodium 138 135 - 145 mmol/L   Potassium 4.1 3.5 - 5.1 mmol/L   Chloride 105 98 - 111 mmol/L   CO2 20 (L) 22 - 32 mmol/L   Glucose, Bld 289 (H) 70 - 99 mg/dL   BUN 25 (H) 8 - 23 mg/dL   Creatinine, Ser 1.19 (H) 0.44 - 1.00 mg/dL   Calcium 9.0 8.9 - 10.3 mg/dL   Total Protein 6.6 6.5 - 8.1 g/dL   Albumin 2.4 (L) 3.5 - 5.0 g/dL   AST 58 (H) 15 - 41 U/L   ALT 36 0 - 44 U/L   Alkaline Phosphatase 293 (H) 38 - 126 U/L   Total Bilirubin 0.6 0.3 - 1.2 mg/dL   GFR calc non Af Amer 42 (L) >60 mL/min   GFR calc Af Amer 48 (L) >60 mL/min   Anion gap 13 5 - 15    Comment: Performed at [facility], [address],  [place], [place] [address]  Troponin I - Once     Status: None   Collection Time: 01/17/19  2:04 PM  Result Value Ref Range   Troponin I <0.03 <0.03 ng/mL    Comment: Performed at [facility] [address] [place], [place] [address]  CBC with Differential     Status: Abnormal   Collection Time: 01/17/19  2:04 PM  Result Value Ref Range   WBC 4.0 4.0 - 10.5 K/uL   RBC 4.09 3.87 - 5.11 MIL/uL   Hemoglobin 11.2 (L) 12.0 - 15.0 g/dL   HCT 36.7 36.0 - 46.0 %   MCV 89.7 80.0 - 100.0 fL   MCH 27.4 26.0 - 34.0 pg   MCHC 30.5 30.0 - 36.0 g/dL   RDW 15.8 (H) 11.5 - 15.5 %   Platelets 178 150 - 400 K/uL   nRBC 0.0 0.0 - 0.2 %   Neutrophils Relative % 81 %   Neutro Abs 3.3 1.7 - 7.7 K/uL   Lymphocytes Relative 8 %   Lymphs Abs 0.3 (L) 0.7 - 4.0 K/uL   Monocytes Relative 10 %   Monocytes Absolute 0.4 0.1 - 1.0 K/uL   Eosinophils Relative 0 %   Eosinophils  Absolute 0.0 0.0 - 0.5 K/uL   Basophils Relative 0 %   Basophils Absolute 0.0 0.0 - 0.1 K/uL   Immature Granulocytes 1 %   Abs Immature Granulocytes 0.03 0.00 - 0.07 K/uL    Comment: Performed at [facility] [address], [place], [place] [address]  CBG monitoring, ED     Status: Abnormal   Collection Time: 01/17/19  2:05 PM  Result Value Ref Range   Glucose-Capillary 270 (H) 70 - 99 mg/dL  CBG monitoring, ED     Status: Abnormal   Collection Time: 01/17/19  2:44 PM  Result Value Ref Range   Glucose-Capillary 267 (H) 70 - 99 mg/dL  Urinalysis, Routine w reflex microscopic     Status: Abnormal   Collection Time: 01/17/19  5:09 PM  Result Value Ref Range   Color, Urine YELLOW YELLOW   APPearance HAZY (A) CLEAR   Specific Gravity, Urine 1.012 1.005 - 1.030   pH 5.0 5.0 - 8.0   Glucose, UA >=500 (A) NEGATIVE mg/dL   Hgb urine dipstick SMALL (A) NEGATIVE   Bilirubin Urine NEGATIVE NEGATIVE   Ketones, ur NEGATIVE NEGATIVE mg/dL   Protein, ur NEGATIVE NEGATIVE mg/dL   Nitrite NEGATIVE NEGATIVE   Leukocytes,Ua NEGATIVE NEGATIVE   RBC / HPF 11-20 0 - 5 RBC/hpf   WBC, UA 0-5 0 - 5 WBC/hpf   Bacteria, UA RARE (A) NONE SEEN   Squamous Epithelial / LPF 0-5 0 - 5   Mucus PRESENT    Hyaline Casts, UA PRESENT     Comment: Performed at [facility], [address], [place], [place] [address]  CBG monitoring, ED     Status: Abnormal   Collection Time: 01/17/19  5:32 PM  Result Value Ref Range   Glucose-Capillary 120 (H) 70 - 99 mg/dL   Dg Chest 2 View  Result Date: 01/17/2019 CLINICAL DATA:  Cough and syncope EXAM: CHEST - 2 VIEW COMPARISON:  None. FINDINGS: There is an apparent nipple shadow on the left. There is no edema or consolidation. Heart is mildly enlarged with pulmonary vascularity normal. No adenopathy. There is aortic atherosclerosis. No bone lesions. IMPRESSION: No edema or consolidation. Mild cardiomegaly. Aortic Atherosclerosis (ICD10-I70.0). Electronically  Signed  By: Lowella Grip III M.D.   On: 01/17/2019 14:37    Pending Labs Unresulted Labs (From admission, onward)    Start     Ordered   01/17/19 1405  T-helper cells (CD4) count (not at [facility])  ONCE - STAT,   R     01/17/19 1405   Signed and Held  CBC  (enoxaparin (LOVENOX)    CrCl >/= 30 ml/min)  Once,   R    Comments:  Baseline for enoxaparin therapy IF NOT ALREADY DRAWN.  Notify MD if PLT < 100 K.    Signed and Held   Signed and Held  Creatinine, serum  (enoxaparin (LOVENOX)    CrCl >/= 30 ml/min)  Once,   R    Comments:  Baseline for enoxaparin therapy IF NOT ALREADY DRAWN.    Signed and Held   Signed and Held  Creatinine, serum  (enoxaparin (LOVENOX)    CrCl >/= 30 ml/min)  Weekly,   R    Comments:  while on enoxaparin therapy    Signed and Held   Signed and Held  TSH  Once,   R     Signed and Held   Signed and Held  Hemoglobin A1c  Once,   R     Signed and Held   Signed and Held  Comprehensive metabolic panel  Tomorrow morning,   R     Signed and Held   Signed and Held  CBC  Tomorrow morning,   R     Signed and Held   Visual merchandiser and Held  HIV-1 RNA quant-no reflex-bld  Once,   R     Signed and Held   Signed and Held  Vitamin B12  (Anemia Panel (PNL))  Once,   R     Signed and Held   Signed and Held  Folate  (Anemia Panel (PNL))  Once,   R     Signed and Held   Signed and Held  Iron and TIBC  (Anemia Panel (PNL))  Once,   R     Signed and Held   Signed and Held  Ferritin  (Anemia Panel (PNL))  Once,   R     Signed and Held   Signed and Held  Reticulocytes  (Anemia Panel (PNL))  Once,   R     Signed and Held          Vitals/Pain Today's Vitals   01/17/19 1530 01/17/19 1645 01/17/19 1730 01/17/19 1733  BP: (!) 134/96 133/81 128/73   Pulse: (!) 49 (!) 52 (!) 59   Resp: 19 15 20    Temp:      TempSrc:      SpO2: 99% 98% 99%   PainSc:    0-No pain    Isolation Precautions No active isolations  Medications Medications  dextrose 10 % infusion ( Intravenous  Hold 01/17/19 1447)  dextrose 50 % solution (  Given 01/17/19 1400)    Mobility walks Low fall risk   Focused Assessments Cardiac Assessment Handoff:  Cardiac Rhythm: Sinus bradycardia Lab Results  Component Value Date   TROPONINI <0.03 01/17/2019   No results found for: DDIMER Does the Patient currently have chest pain? No     R Recommendations: See Admitting Provider Note  Report given to:   Additional Notes:

## 2019-01-17 NOTE — ED Provider Notes (Signed)
Emergency Department Provider Note   I have reviewed the triage vital signs and the nursing notes.   HISTORY  Chief Complaint Loss of Consciousness   HPI Alison Ruiz is a 85 y.o. female with PMH of HIV with medication non-compliance, HLD, and Hep B presents to the ED by EMS with syncope. Patient tells me that she is "not sure what happened." She ate a small breakfast and then went to her job.  Patient denies any chest pain, heart palpitations, shortness of breath.  She states that her boss saw her and said that she passed out for approximately 5 minutes.  No report of losing pulses.  No report of chest compressions on scene.  Patient continued to breathe and there is no report that she had seizure-like activity.  Patient has a history of HIV and tells me that she has not taken her anterior retrovirals or Bactrim in over a year.  She tells me that she is feeling well and that the medications are expensive.  She has not been having any shortness of breath, cough, fever, or other symptoms.  She tells me that she did skip eating lunch today which she does occasionally.  No active symptoms at this time.   EMS state that in route the patient had an additional episode of near syncope.  They report that symptoms improved after laying her flat. No report of rhythm change.   In review of the chart the patient had followed with infectious disease until late 2018.  She, at that time, was on prophylactic Bactrim. Last CD4 count in our system is 100 on 08/19/17.   No past medical history on file.  Patient Active Problem List   Diagnosis Date Noted  . Unintentional weight loss 07/05/2017  . Dyslipidemia 11/12/2011  . Arrhythmia 05/07/2011  . GRIEF REACTION, ACUTE 05/24/2008  . ANXIETY 04/07/2007  . Depression 04/07/2007  . VENOUS INSUFFICIENCY 04/07/2007  . Dental caries 04/07/2007  . HEPATITIS B, HX OF 04/07/2007  . SYNCOPE, HX OF 04/07/2007  . Human immunodeficiency virus (HIV) disease  ([place]) 02/25/2007   Allergies Patient has no known allergies.  No family history on file.  Social History Social History   Tobacco Use  . Smoking status: Never Smoker  . Smokeless tobacco: Never Used  Substance Use Topics  . Alcohol use: No  . Drug use: No    Review of Systems  Constitutional: No fever/chills Eyes: No visual changes. ENT: No sore throat. Cardiovascular: Denies chest pain. Positive syncope.  Respiratory: Denies shortness of breath. Gastrointestinal: No abdominal pain.  No nausea, no vomiting.  No diarrhea.  No constipation. Genitourinary: Negative for dysuria. Musculoskeletal: Negative for back pain. Skin: Negative for rash. Neurological: Negative for headaches, focal weakness or numbness.  10-point ROS otherwise negative.  ____________________________________________   PHYSICAL EXAM:  VITAL SIGNS: ED Triage Vitals  Enc Vitals Group     BP 01/17/19 1352 140/72     Pulse Rate 01/17/19 1352 64     Resp 01/17/19 1352 19     Temp 01/17/19 1352 (!) 97.5 F (36.4 C)     Temp Source 01/17/19 1352 Oral     SpO2 01/17/19 1352 98 %     Pain Score 01/17/19 1359 0   Constitutional: Alert and oriented. Well appearing and in no acute distress. Very thin.  Eyes: Conjunctivae are normal. Head: Atraumatic. Nose: No congestion/rhinnorhea. Mouth/Throat: Mucous membranes are moist.  Neck: No stridor.  Cardiovascular: Normal rate, regular rhythm. Good peripheral circulation.  Grossly normal heart sounds.   Respiratory: Normal respiratory effort.  No retractions. Lungs CTAB. Gastrointestinal: Soft and nontender. No distention.  Musculoskeletal: No lower extremity tenderness nor edema. No gross deformities of extremities. Neurologic:  Normal speech and language. No gross focal neurologic deficits are appreciated. No CN deficits 2-12. No pronator drift.  Skin:  Skin is warm, dry and intact. No rash noted.  ____________________________________________   LABS  (all labs ordered are listed, but only abnormal results are displayed)  Labs Reviewed  COMPREHENSIVE METABOLIC PANEL - Abnormal; Notable for the following components:      Result Value   CO2 20 (*)    Glucose, Bld 289 (*)    BUN 25 (*)    Creatinine, Ser 1.19 (*)    Albumin 2.4 (*)    AST 58 (*)    Alkaline Phosphatase 293 (*)    GFR calc non Af Amer 42 (*)    GFR calc Af Amer 48 (*)    All other components within normal limits  CBC WITH DIFFERENTIAL/PLATELET - Abnormal; Notable for the following components:   Hemoglobin 11.2 (*)    RDW 15.8 (*)    Lymphs Abs 0.3 (*)    All other components within normal limits  CBG MONITORING, ED - Abnormal; Notable for the following components:   Glucose-Capillary 44 (*)    All other components within normal limits  CBG MONITORING, ED - Abnormal; Notable for the following components:   Glucose-Capillary 270 (*)    All other components within normal limits  CBG MONITORING, ED - Abnormal; Notable for the following components:   Glucose-Capillary 267 (*)    All other components within normal limits  TROPONIN I  URINALYSIS, ROUTINE W REFLEX MICROSCOPIC  T-HELPER CELLS (CD4) COUNT (NOT AT [facility])   ____________________________________________  EKG   EKG Interpretation  Date/Time:  Tuesday January 17 2019 14:45:17 EDT Ventricular Rate:  49 PR Interval:    QRS Duration: 126 QT Interval:  500 QTC Calculation: 452 R Axis:   -72 Text Interpretation:  Sinus bradycardia Left bundle branch block No STEMI.  Confirmed by Nanda Quinton [phone]) on 01/17/2019 2:55:17 PM       ____________________________________________  RADIOLOGY  Dg Chest 2 View  Result Date: 01/17/2019 CLINICAL DATA:  Cough and syncope EXAM: CHEST - 2 VIEW COMPARISON:  None. FINDINGS: There is an apparent nipple shadow on the left. There is no edema or consolidation. Heart is mildly enlarged with pulmonary vascularity normal. No adenopathy. There is aortic atherosclerosis. No  bone lesions. IMPRESSION: No edema or consolidation. Mild cardiomegaly. Aortic Atherosclerosis (ICD10-I70.0). Electronically Signed   By: Lowella Grip III M.D.   On: 01/17/2019 14:37    ____________________________________________   PROCEDURES  Procedure(s) performed:   Procedures  None  ____________________________________________   INITIAL IMPRESSION / ASSESSMENT AND PLAN / ED COURSE  Pertinent labs & imaging results that were available during my care of the patient were reviewed by me and considered in my medical decision making (see chart for details).   Patient presents to the emergency department for evaluation of syncope.  She is well-appearing here without neuro deficits.  Vital signs are normal including blood pressure and heart rate.  EMS reported an episode of near syncope with bradycardia in route.  No chest pain.  Plan for labs including troponin along with chest x-ray.  No neuro imaging at this time with normal neurologic exam.  No headache symptoms.   Labs are largely unremarkable. Mild elevated creatinine. CD4  and UA pending. Patient with some bradycardia during orthostatics and drop in BP with starting to systolic 465. Question SSS. Remains awake and alert. CBG elevated now with D50. Continue to follow. Hold D10 infusion for now.   Discussed patient's case with Family Medicine to request admission. Patient and family (if present) updated with plan. Care transferred to [facility] Medicine service.  I reviewed all nursing notes, vitals, pertinent old records, EKGs, labs, imaging (as available).  ____________________________________________  FINAL CLINICAL IMPRESSION(S) / ED DIAGNOSES  Final diagnoses:  Syncope and collapse  Bradycardia  Hypoglycemia    MEDICATIONS GIVEN DURING THIS VISIT:  Medications  dextrose 10 % infusion ( Intravenous Hold 01/17/19 1447)  dextrose 50 % solution (  Given 01/17/19 1400)    Note:  This document was prepared using Dragon  voice recognition software and may include unintentional dictation errors.  Nanda Quinton, MD Emergency Medicine    Long, [facility], MD 01/17/19 [id number]

## 2019-01-17 NOTE — ED Notes (Signed)
This RN is acting as Art therapist today, and spoke with pt about updating any family/friends on her conition. Pt wanted her boss, Babs Sciara updated and also wanted to check on where her purse was left. Renato informed this RN that her purse is locked in a locker at work and he will be picking her up if she is discharged. Pt did not want any family called.  Renato's number: [phone]; [phone]

## 2019-01-17 NOTE — ED Notes (Signed)
This RN updated pt's friend, Babs Sciara that pt will be admitted.

## 2019-01-17 NOTE — H&P (Addendum)
[facility] Admission History and Physical Service Pager: [phone]  Patient name: Alison Ruiz Medical record number: [id number] Date of birth: [birth year] Age: 85 y.o. Gender: female  Primary Care Provider: Patient, No Pcp Per Consultants: none Code Status: Full  Preferred Emergency Contact: None   Chief Complaint: Syncopal event  Assessment and Plan: Alison Ruiz is a 85 y.o. female presenting following a non-prodromal syncopal event likely multifactorial in etiology. PMH is significant for HIV not on chronic antiretrovirals, sinus arrhythmia, anxiety/depression, hyperlipidemia, resolved hepatitis B.   Syncopal event, likely multifactorial: Acute, stable.  Pt reports sudden non-prodromal (however appears to not remember the situation well currently) syncopal event while at work [employer]. On exam, frail older female, neurologically intact. HR in 50s (low: mid 40s) at rest, orthostatics positive via change in SBP and HR. Glucose 44, hgb 11.2.  Troponin 0.03.  EKG sinus bradycardia with LBBB.  Suspect syncopal event is likely multifactorial in the setting of symptomatic bradycardia, orthostatic hypotension, and hypoglycemia. While she likely chronically has a component of orthostatic hypotension, suspect acute worsening in the setting of poor fluid intake in the past few days  In the setting of resolved abdominal pain. Less likely neurologic etiology she is without acute deficits or noted to have seizure activity per witness though patient was on her standing for period of time in her warm room, event could be from vasovagal episode. No concern for medication side effect as she is only been taking Advil.  - Admit to cardiac telemetry, attending Dr. Wendy Poet - Consult cardiology, appreciate recommendations i.e. Holter monitor - IVF NS at 75 mL/hour - Monitor CBGs as below - Monitor telemetry - CBC, CMP, TSH - Repeat orthostatics prior to DC    Sinus  bradycardia w/ h/o arrhythmia: Stable.  HR 49. EKG showing sinus bradycardia with LBBB/PVC's.  Heart rate appears to respond appropriately to activity.  Previous history of sinus arrhythmia. Does not follow with a cardiologist. -Consult cardiology/EP, appreciate recommendations as above -Monitor telemetry  Hypoglycemia: Acute, now stable.  Glucose 44 on admit, received D10 with resolution. CBG now 267>120. No known history of diabetes, however question possible presence with elevated glucose and >500 glucosuria.  -Obtain A1c -CBGs before meals and nightly  HIV not on chronic antivirals: Chronic.  Last CD4 count 100 in 07/2017, viral load 180 November 2018.  Previously followed with ID, Dr. Megan Salon, last seen in 08/2017.,  Additionally has not been on any prophylactic Bactrim or antiretroviral therapy in the past 2 years due to expense.  Patient would be interested in restarting therapy if of low cost. -Consult infectious disease, appreciate recommendations - F/U CD4 count and HIV RNA  AKI vs CKD: Cr 1.19 on admit, last creatinine 0.71 in 2018.  Previous baseline appears around 0.7-0.8. Suspect may be acute in setting of elevated BUN 25 and reported decreased po intake over past few days. - Monitor BMP -Avoid nephrotoxic medications as possible - Maintenance IVF 75 mL/hour  Microcytic anemia: Chronic, stable.  Hgb 11.2, MCV 89. Last hgb in 2018 was 10.9, however previous baseline appears to be around 12-13.  Likely iron deficiency in the setting of poor nutrition.  In an older female, must consider colon cancer, however no active signs of bleeding or symptomatology. - Anemia panel  - Monitor CBC  Elevated alkaline phosphatase: Acute. Approximately 2x normal, Alk phos 293. Last value in 2014 was 75.  Total bili and ALT WNL, AST 58.  No associated  RUQ abdominal pain.  Suspect osseous etiology in a older frail female vs liver etiology vs postprandial. -Monitor fasting CMP in a.m.   -Consider GGT if remains elevated   Protein calorie malnutrition: Likely chronic. Albumin 2.4.  Frail female, last documented weight 40 kg. Patient reports poor oral intake for the past two years after losing her denture. - Consult nutrition - Ensure twice daily between meals  Anxiety  depression: Chronic, stable.  Well-managed, not on any controlling medication. - Monitor for symptoms  History of hepatitis B: Stable. Remote infection cleared per hepatitis B serologies in 2013: Hep B core and surface Ab positive, surface Ag negative.   FEN/GI: Soft diet, IVF 75 mL/hour Prophylaxis: Lovenox  Disposition: Admit to cardiac telemetry, attending Dr. Wendy Poet  History of Present Illness:  Alison Ruiz is a 85 y.o. female with a history significant for HIV and sinus arrhythmia presenting following a non-prodromal syncopal event.  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 Next thing she remembers is waking up in route to the ED.  Was informed by the owner of the laundromat that she passed out for approximately a few minutes.  She does not recall feeling lightheaded/dizzy, or having palpitations, chest pain, or shortness of breath prior to.  Last ate breakfast, bowl of cereal, and drink some orange juice today.  Does not know what she landed on, denies any areas of pain.  Additionally is not sure if she was standing up or sitting down when it happened.  No seizure activity reported per EMS.  Has been eating "lighter "than normal for the past few days due to her stomach being upset, vague in nature. No further abdominal pains now, denies any hematochezia, melena, vomiting.  Seems to report some diarrhea.  Now feels back to her baseline.  Reports a history of previous syncope back in 2008 when she first was diagnosed with HIV.  Has not been taking any antiretroviral therapy for the past 2 years due to the expense.   Lives alone,  able to do all of her ADLs on her own.  Non-smoker, does not drink alcohol.  Has only been taking Advil on as needed basis for medications.  Would not like any her information shared with any family members.  ED Course:  On arrival, she was hemodynamically stable and in no acute distress.  Reported by EMS to have an episode of near syncope with bradycardia.  No further events while in the ED.  Performed orthostatics and noted to have some bradycardia to upper 40s with moving from laying to sitting.  Labs notable for BUN 25, creatinine 1.19, alk phos 293, AST 58, albumin 2.4, hemoglobin 11.2.  Chest x-ray without edema or consolidations.  Orthostatics positive.  Received amp of D50 for glucose of 44 that increased appropriately.   Review Of Systems: Per HPI with the following additions:   Review of Systems  Constitutional: Negative for chills, fever and malaise/fatigue.  Respiratory: Negative for cough, sputum production, shortness of breath and wheezing.   Cardiovascular: Negative for chest pain, palpitations, orthopnea and leg swelling.  Gastrointestinal: Positive for abdominal pain and diarrhea. Negative for blood in stool, constipation, melena, nausea and vomiting.  Genitourinary: Negative for dysuria.  Musculoskeletal: Positive for falls. Negative for back pain, myalgias and neck pain.  Neurological: Positive for loss of consciousness. Negative for sensory change, seizures and weakness.    Patient Active Problem List   Diagnosis Date Noted  .  Unintentional weight loss 07/05/2017  . Dyslipidemia 11/12/2011  . Arrhythmia 05/07/2011  . GRIEF REACTION, ACUTE 05/24/2008  . ANXIETY 04/07/2007  . Depression 04/07/2007  . VENOUS INSUFFICIENCY 04/07/2007  . Dental caries 04/07/2007  . HEPATITIS B, HX OF 04/07/2007  . SYNCOPE, HX OF 04/07/2007  . Human immunodeficiency virus (HIV) disease ([place]) 02/25/2007    Past Medical History: No past medical history on file.  Past Surgical  History: No pertinent past surgical history.  Social History: Social History   Tobacco Use  . Smoking status: Never Smoker  . Smokeless tobacco: Never Used  Substance Use Topics  . Alcohol use: No  . Drug use: No   Additional social history: Lives alone.  Non-smoker, does not drink alcohol. Please also refer to relevant sections of EMR.  Family History: No family history on file.   Allergies and Medications: No Known Allergies No current facility-administered medications on file prior to encounter.    Current Outpatient Medications on File Prior to Encounter  Medication Sig Dispense Refill  . ibuprofen (ADVIL) 200 MG tablet Take 200 mg by mouth every 6 (six) hours as needed for headache or moderate pain.    . Darunavir-Cobicisctat-Emtricitabine-Tenofovir Alafenamide (SYMTUZA) 800-150-200-10 MG TABS Take 1 tablet by mouth daily with breakfast. (Patient not taking: Reported on 01/17/2019) 30 tablet 11  . dolutegravir (TIVICAY) 50 MG tablet Take 1 tablet (50 mg total) by mouth daily. (Patient not taking: Reported on 01/17/2019) 30 tablet 11    Objective: BP (!) 134/96   Pulse (!) 49   Temp (!) 97.5 F (36.4 C) (Oral)   Resp 19   SpO2 99%  Exam: General: Alert, older frail female, in no acute distress HEENT: NCAT, MM slightly dry, EOMI Cardiac: Bradycardic, regular rhythm no m/g/r appreciated Lungs: Clear bilaterally, no increased WOB on RA Abdomen: soft, non-tender, non-distended, normoactive BS Msk: Moves all extremities spontaneously  Neuro: Alert and oriented, speech appropriate, able to follow commands and hold normal conversation, no focal neuro deficits noted Ext: Warm, dry, 2+ distal pulses, no edema Derm: No bruises or rashes noted Psych: Appropriate affect  Labs and Imaging: CBC BMET  Recent Labs  Lab 01/17/19 1404  WBC 4.0  HGB 11.2*  HCT 36.7  PLT 178   Recent Labs  Lab 01/17/19 1404  NA 138  K 4.1  CL 105  CO2 20*  BUN 25*  CREATININE 1.19*   GLUCOSE 289*  CALCIUM 9.0     Dg Chest 2 View  Result Date: 01/17/2019 CLINICAL DATA:  Cough and syncope EXAM: CHEST - 2 VIEW COMPARISON:  None. FINDINGS: There is an apparent nipple shadow on the left. There is no edema or consolidation. Heart is mildly enlarged with pulmonary vascularity normal. No adenopathy. There is aortic atherosclerosis. No bone lesions. IMPRESSION: No edema or consolidation. Mild cardiomegaly. Aortic Atherosclerosis (ICD10-I70.0). Electronically Signed   By: Lowella Grip III M.D.   On: 01/17/2019 14:37   I have seen and evaluated the patient with Dr. Higinio Plan I am in agreement with the note above in its revised form. My additions are in blue.  Marjie Skiff, MD Family Medicine, PGY-3  Patriciaann Clan, DO 01/17/2019, 4:24 PM PGY-1, [place] Intern pager: [phone], text pages welcome

## 2019-01-17 NOTE — ED Triage Notes (Signed)
Pt brought in by ems for c/o syncopal episode that occurred while she was working at [employer]; co workers stated that she noticed the patient became unsteady on her feet and lowered her to the ground , after that patient became unresponsive and began having trouble breathing ; upon ems arrival ,patient had weak radial pulses and was still unresponsive ; once they put her in the truck she became responsive ; CBG 162 ; pt alert and oriented x 4 and following simple commands ; pt grip strength equals

## 2019-01-18 ENCOUNTER — Encounter (HOSPITAL_COMMUNITY): Payer: Self-pay | Admitting: Family Medicine

## 2019-01-18 ENCOUNTER — Observation Stay (HOSPITAL_COMMUNITY): Payer: Medicare Other

## 2019-01-18 DIAGNOSIS — R748 Abnormal levels of other serum enzymes: Secondary | ICD-10-CM | POA: Diagnosis present

## 2019-01-18 DIAGNOSIS — R55 Syncope and collapse: Secondary | ICD-10-CM | POA: Diagnosis not present

## 2019-01-18 DIAGNOSIS — E860 Dehydration: Secondary | ICD-10-CM | POA: Diagnosis not present

## 2019-01-18 DIAGNOSIS — B20 Human immunodeficiency virus [HIV] disease: Secondary | ICD-10-CM | POA: Diagnosis not present

## 2019-01-18 DIAGNOSIS — R001 Bradycardia, unspecified: Secondary | ICD-10-CM | POA: Diagnosis not present

## 2019-01-18 DIAGNOSIS — R4020 Unspecified coma: Secondary | ICD-10-CM | POA: Diagnosis not present

## 2019-01-18 DIAGNOSIS — E8809 Other disorders of plasma-protein metabolism, not elsewhere classified: Secondary | ICD-10-CM | POA: Diagnosis present

## 2019-01-18 DIAGNOSIS — R634 Abnormal weight loss: Secondary | ICD-10-CM

## 2019-01-18 DIAGNOSIS — R636 Underweight: Secondary | ICD-10-CM

## 2019-01-18 DIAGNOSIS — E162 Hypoglycemia, unspecified: Secondary | ICD-10-CM | POA: Diagnosis present

## 2019-01-18 DIAGNOSIS — N179 Acute kidney failure, unspecified: Secondary | ICD-10-CM

## 2019-01-18 DIAGNOSIS — I872 Venous insufficiency (chronic) (peripheral): Secondary | ICD-10-CM

## 2019-01-18 DIAGNOSIS — I447 Left bundle-branch block, unspecified: Secondary | ICD-10-CM | POA: Diagnosis present

## 2019-01-18 DIAGNOSIS — F329 Major depressive disorder, single episode, unspecified: Secondary | ICD-10-CM

## 2019-01-18 DIAGNOSIS — B1910 Unspecified viral hepatitis B without hepatic coma: Secondary | ICD-10-CM

## 2019-01-18 HISTORY — DX: Abnormal levels of other serum enzymes: R74.8

## 2019-01-18 LAB — ECHOCARDIOGRAM LIMITED
Height: 57 in
Weight: 1174.4 oz

## 2019-01-18 LAB — CBC
HCT: 33.1 % — ABNORMAL LOW (ref 36.0–46.0)
Hemoglobin: 10.3 g/dL — ABNORMAL LOW (ref 12.0–15.0)
MCH: 26.8 pg (ref 26.0–34.0)
MCHC: 31.1 g/dL (ref 30.0–36.0)
MCV: 86.2 fL (ref 80.0–100.0)
Platelets: 190 10*3/uL (ref 150–400)
RBC: 3.84 MIL/uL — ABNORMAL LOW (ref 3.87–5.11)
RDW: 15.5 % (ref 11.5–15.5)
WBC: 4.6 10*3/uL (ref 4.0–10.5)
nRBC: 0 % (ref 0.0–0.2)

## 2019-01-18 LAB — PROTIME-INR
INR: 1.1 (ref 0.8–1.2)
Prothrombin Time: 14 s (ref 11.4–15.2)

## 2019-01-18 LAB — GLUCOSE, CAPILLARY
Glucose-Capillary: 76 mg/dL (ref 70–99)
Glucose-Capillary: 71 mg/dL (ref 70–99)
Glucose-Capillary: 83 mg/dL (ref 70–99)
Glucose-Capillary: 76 mg/dL (ref 70–99)

## 2019-01-18 LAB — COMPREHENSIVE METABOLIC PANEL
ALT: 28 U/L (ref 0–44)
AST: 36 U/L (ref 15–41)
Albumin: 2.1 g/dL — ABNORMAL LOW (ref 3.5–5.0)
Alkaline Phosphatase: 265 U/L — ABNORMAL HIGH (ref 38–126)
Anion gap: 11 (ref 5–15)
BUN: 23 mg/dL (ref 8–23)
CO2: 20 mmol/L — ABNORMAL LOW (ref 22–32)
Calcium: 8.6 mg/dL — ABNORMAL LOW (ref 8.9–10.3)
Chloride: 109 mmol/L (ref 98–111)
Creatinine, Ser: 0.94 mg/dL (ref 0.44–1.00)
GFR calc Af Amer: >60 mL/min (ref >60)
GFR calc non Af Amer: 55 mL/min — ABNORMAL LOW (ref >60)
Glucose, Bld: 80 mg/dL (ref 70–99)
Potassium: 4 mmol/L (ref 3.5–5.1)
Sodium: 140 mmol/L (ref 135–145)
Total Bilirubin: 0.5 mg/dL (ref 0.3–1.2)
Total Protein: 5.6 g/dL — ABNORMAL LOW (ref 6.5–8.1)

## 2019-01-18 LAB — PREALBUMIN: Prealbumin: 7.4 mg/dL — ABNORMAL LOW (ref 18–38)

## 2019-01-18 LAB — T-HELPER CELLS (CD4) COUNT (NOT AT ARMC)
CD4 % Helper T Cell: 3 % — ABNORMAL LOW (ref 33–65)
CD4 T Cell Abs: <35 /uL — ABNORMAL LOW (ref 400–1790)

## 2019-01-18 LAB — GAMMA GT: GGT: 42 U/L (ref 7–50)

## 2019-01-18 MED ORDER — EMTRICITABINE-TENOFOVIR AF 200-25 MG PO TABS
1.0000 | ORAL_TABLET | Freq: Every day | ORAL | Status: DC
  Administered 2019-01-18: 1 via ORAL
  Filled 2019-01-18 (×2): qty 1

## 2019-01-18 MED ORDER — DARUNAVIR-COBICISTAT 800-150 MG PO TABS
1.0000 | ORAL_TABLET | Freq: Every day | ORAL | Status: DC
  Filled 2019-01-18 (×2): qty 1

## 2019-01-18 MED ORDER — EMTRICITABINE-TENOFOVIR AF 200-25 MG PO TABS
1.0000 | ORAL_TABLET | Freq: Every day | ORAL | Status: DC
  Administered 2019-01-19: 1 via ORAL
  Filled 2019-01-18: qty 1

## 2019-01-18 MED ORDER — DOLUTEGRAVIR SODIUM 50 MG PO TABS: 50.0000 mg | ORAL_TABLET | Freq: Every day | ORAL | 3 refills | Status: DC

## 2019-01-18 MED ORDER — SULFAMETHOXAZOLE-TRIMETHOPRIM 800-160 MG PO TABS: 1.0000 | ORAL_TABLET | Freq: Every day | ORAL | 3 refills | Status: DC

## 2019-01-18 MED ORDER — SULFAMETHOXAZOLE-TRIMETHOPRIM 800-160 MG PO TABS
1.0000 | ORAL_TABLET | Freq: Every day | ORAL | Status: DC
  Administered 2019-01-18 – 2019-01-21 (×4): 1 via ORAL
  Filled 2019-01-18 (×4): qty 1

## 2019-01-18 MED ORDER — DOLUTEGRAVIR SODIUM 50 MG PO TABS
50.0000 mg | ORAL_TABLET | Freq: Every day | ORAL | Status: DC
  Administered 2019-01-18 – 2019-01-24 (×7): 50 mg via ORAL
  Filled 2019-01-18 (×7): qty 1

## 2019-01-18 MED ORDER — ADULT MULTIVITAMIN W/MINERALS CH
1.0000 | ORAL_TABLET | Freq: Every day | ORAL | Status: DC
  Administered 2019-01-18 – 2019-01-23 (×6): 1 via ORAL
  Filled 2019-01-18 (×6): qty 1

## 2019-01-18 MED ORDER — DARUN-COBIC-EMTRICIT-TENOFAF 800-150-200-10 MG PO TABS: 1.0000 | ORAL_TABLET | Freq: Every day | ORAL | 3 refills | Status: DC

## 2019-01-18 MED ORDER — ENSURE ENLIVE PO LIQD
1.0000 | Freq: Three times a day (TID) | ORAL | Status: DC
  Administered 2019-01-18 – 2019-01-23 (×15): 237 mL via ORAL

## 2019-01-18 MED ORDER — AZITHROMYCIN 600 MG PO TABS
600.0000 mg | ORAL_TABLET | Freq: Once | ORAL | Status: AC
  Administered 2019-01-19: 600 mg via ORAL
  Filled 2019-01-18: qty 1

## 2019-01-18 MED ORDER — DARUNAVIR-COBICISTAT 800-150 MG PO TABS
1.0000 | ORAL_TABLET | Freq: Every day | ORAL | Status: DC
  Administered 2019-01-19: 1 via ORAL
  Filled 2019-01-18: qty 1

## 2019-01-18 MED ORDER — AZITHROMYCIN 600 MG PO TABS
600.0000 mg | ORAL_TABLET | Freq: Every day | ORAL | Status: AC
  Administered 2019-01-18: 600 mg via ORAL
  Filled 2019-01-18: qty 1

## 2019-01-18 MED FILL — SYMTUZA 800-150-200-10 MG T: 800-150-200 | 30 days supply | Qty: 30 | Fill #0 | Status: TO

## 2019-01-18 MED FILL — TIVICAY 50 MG TABLET: 50 | 30 days supply | Qty: 30 | Fill #0 | Status: TO

## 2019-01-18 MED FILL — SULFAMETHOXAZOLE-TMP DS TAB: 800-160 | 30 days supply | Qty: 30 | Fill #0

## 2019-01-18 NOTE — Consult Note (Signed)
   [facility]    Date of Admission:  01/17/2019     Total days of antibiotics 0               Reason for Consult: HIV  Referring Provider: McDiarmid Primary Care Provider: Patient, No Pcp Per   Assessment/Plan:  Ms. Alison Ruiz is a 85 y/o female with history of HIV/AIDS, Hepatitis B and depression who has been out of care for her HIV since October 2018 and admitted with syncopal episode lasting 5 minutes likely related to poor oral intake and dehydration.  AIDS - Poorly controlled HIV/AIDS and not currently on medications for the past 1.5 years without signs/symptoms of opportunistic infection at present. CD4 of <35 and at high risk for opportunistic infection. Will re-start Tivicay and Symtuza for ART and start Bactrim DS and Azithromycin for OI prophylaxis. Plan to check AFB blood culture to rule out atypical infection. Will work with pharmacy team to obtain 30 day supply of medications at discharge and coordinate with financial counselor to ensure adequate coverage for medication assistance.   Unintentional Weight Loss - Weight loss of about 15 pounds since she was last seen likely related to decreased oral intake due to poor oral hygiene as well as poorly controlled HIV. Primary team is supplementing with Ensure and is eating okay for now.   Syncope - Current work up with no significant findings and 2D echo pending. Likely related to decreased oral intake. Appears to be doing well with fluid resuscitation and nutrition. Appreciate Family Medicine management.    Principal Problem:   Loss of consciousness (HCC) Active Problems:   Human immunodeficiency virus (HIV) disease (HCC)   Unintentional weight loss   Abnormal serum level of alkaline phosphatase   Hypoalbuminemia   Left bundle branch block (LBBB) on electrocardiogram   Severely underweight adult   AKI (acute kidney injury) (HCC)   Bradycardia   Hypoglycemia   . enoxaparin (LOVENOX) injection  30  mg Subcutaneous Q24H  . feeding supplement (ENSURE ENLIVE)  1 Bottle Oral TID BM  . multivitamin with minerals  1 tablet Oral Daily     HPI: Alison Ruiz is a 85 y.o. female is a previous medical history of HIV, depression, Hepatitis B and venous insufficency admitted to the hospital with syncope lasting approximately 5 minutes per bystanders.   Alison Ruiz is known to the ID service and was last seen in the office by Dr. Campbell on 09/09/17 for routine follow up with apparent good adherence and tolerance to her ART regimen of Symtuza and Tivicay. She has multiple mutations conferring resistance as below from 2018:   HIV Genotype Composite Data Genotype Dates:   Mutations in Bold impact drug susceptibility RT Mutations M41L, D67N, K70R, M184V, K101E, G190A  PI Mutations M46L  Integrase Mutations    Interpretation of Genotype Data per [employer] HIV Database Nucleoside RTIs  abacavir (ABC) Intermediate Resistance zidovudine (AZT)  Intermediate Resistance emtricitabine (FTC)  High-Level Resistance lamivudine (3TC)  High-Level Resistance tenofovir (TDF)  Susceptible   Non-Nucleoside RTIs  efavirenz (EFV)  High-Level Resistance etravirine (ETR)  Intermediate Resistance nevirapine (NVP) High-Level Resistance rilpivirine (RPV) High-Level Resistance   Protease Inhibitors  atazanavir/r (ATV/r)           Potential Low-Level Resistance darunavir/r (DRV/r)           Susceptible lopinavir/r (LPV/r)            Potential Low-Level Resistance   Integrase Inhibitors   Previous   Regimen:  2009 - HLA neg  COMBIVIR 150-300 MG TABS (LAMIVUDINE-ZIDOVUDINE) Take 1 tablet by mouth two times a day VIREAD 300 MG TABS (TENOFOVIR DISOPROXIL FUMARATE) Take 1 tablet by mouth once a day LEXIVA 700 MG TABS (FOSAMPRENAVIR CALCIUM) Take 1 tablet by mouth two times a day NORVIR 100 MG CAPS (RITONAVIR) Take 1 tablet by mouth two times a day  ------------------------------------------------------------------------------------------------------------ COMBIVIR 150-300 MG TABS (LAMIVUDINE-ZIDOVUDINE) Take 1 tablet by mouth two times a day INTELENCE 100 MG TABS (ETRAVIRINE) Take2 tablets by mouth two times a day PREZISTA 300 MG TABS (DARUNAVIR) Take 2 tablets by mouth twice a day NORVIR 100 MG TABS (RITONAVIR) Take 1 tablet by mouth two times a day  Alison Ruiz has been off of her medications for approximately 1.5 years now. Describes the primary reason for stopping medication is the cost as she has Medicare and was not able to afford it when she reached the "donut hole". She has been working at a laundromat and has no problems with transportation. Her oral intake of fluid and food has decreased since losing her remaining teeth leading to a nearly 15 pound weight loss over the course of 2 years. She has been feeling okay, but weak. Denies fevers, chills, night sweats, headaches, changes in vision, neck pain/stiffness, nausea, diarrhea, vomiting, lesions or rashes. Her syncopal work up to this point has been unrevealing. CD4 count found to be <35.   Review of Systems: Review of Systems  Constitutional: Negative for chills, fever and weight loss.  Respiratory: Negative for cough, shortness of breath and wheezing.   Cardiovascular: Negative for chest pain and leg swelling.  Gastrointestinal: Negative for abdominal pain, constipation, diarrhea, nausea and vomiting.  Skin: Negative for rash.     Past Medical History:  Diagnosis Date  . Abnormal serum level of alkaline phosphatase 01/18/2019  . GRIEF REACTION, ACUTE 05/24/2008   Annotation: after brother's death 1/09 Qualifier: Diagnosis of  By: Campbell MD, John    . HIV (human immunodeficiency virus infection) (HCC)     Social History   Tobacco Use  . Smoking status: Never Smoker  . Smokeless tobacco: Never Used  Substance Use Topics  . Alcohol use: No  . Drug use: No    No  family history on file.  No Known Allergies  OBJECTIVE: Blood pressure 134/68, pulse 73, temperature 98.4 F (36.9 C), temperature source Oral, resp. rate 20, height 4' 9" (1.448 m), weight 33.3 kg, SpO2 93 %.  Physical Exam Constitutional:      General: She is not in acute distress.    Appearance: She is well-developed. She is cachectic. She is ill-appearing.     Comments: Lying in bed with head of bed elevated; pleasant.   Eyes:     Conjunctiva/sclera: Conjunctivae normal.  Neck:     Musculoskeletal: Neck supple.  Cardiovascular:     Rate and Rhythm: Normal rate and regular rhythm.     Heart sounds: Normal heart sounds. No murmur. No friction rub. No gallop.   Pulmonary:     Effort: Pulmonary effort is normal. No respiratory distress.     Breath sounds: Normal breath sounds. No wheezing or rales.  Chest:     Chest wall: No tenderness.  Abdominal:     General: Bowel sounds are normal.     Palpations: Abdomen is soft.     Tenderness: There is no abdominal tenderness.  Lymphadenopathy:     Cervical: No cervical adenopathy.  Skin:    General: Skin   is warm and dry.     Findings: No rash.  Neurological:     Mental Status: She is alert and oriented to person, place, and time.  Psychiatric:        Behavior: Behavior normal. Behavior is cooperative.        Thought Content: Thought content normal.        Judgment: Judgment normal.     Lab Results Lab Results  Component Value Date   WBC 4.6 01/18/2019   HGB 10.3 (L) 01/18/2019   HCT 33.1 (L) 01/18/2019   MCV 86.2 01/18/2019   PLT 190 01/18/2019    Lab Results  Component Value Date   CREATININE 0.94 01/18/2019   BUN 23 01/18/2019   NA 140 01/18/2019   K 4.0 01/18/2019   CL 109 01/18/2019   CO2 20 (L) 01/18/2019    Lab Results  Component Value Date   ALT 28 01/18/2019   AST 36 01/18/2019   ALKPHOS 265 (H) 01/18/2019   BILITOT 0.5 01/18/2019     Microbiology: No results found for this or any previous visit  (from the past 240 hour(s)).   Greg Calone, NP [facility] [phone] Pager  01/18/2019  10:56 AM  

## 2019-01-18 NOTE — TOC Initial Note (Addendum)
Transition of Care [facility]) - Initial/Assessment Note    Patient Details  Name: Alison Ruiz MRN: [id number] Date of Birth: October 28, 1930  [facility] CM/SW Contact:    Sherrilyn Rist Phone Number: [phone] 01/18/2019, 12:05 PM  Clinical Narrative:                 Patient lives at home alone; no PCP, she is agreeable to CM assisting her in finding a PCP; has private insurance with Medicare / [employer] Medicare; pharmacy of choice is [facility]; patient states that she continues to drive her care, works everyday at the [employer] 4 hrs daily [employer]. No need identified at this time; CM will continue to follow for progression of care. Follow up apt made with Dr Kole[facility] for 01/25/2019 at 10 am. B Pennie Rushing  Expected Discharge Plan: Home/Self Care Barriers to Discharge: No Barriers Identified   Patient Goals and CMS Choice Patient states their goals for this hospitalization and ongoing recovery are:: to stay at home CMS Medicare.gov Compare Post Acute Care list provided to:: Patient Choice offered to / list presented to : NA  Expected Discharge Plan and Services Expected Discharge Plan: Home/Self Care In-house Referral: NA Discharge Planning Services: CM Consult Post Acute Care Choice: NA                   DME Arranged: N/A DME Agency: NA       HH Arranged: NA HH Agency: NA        Prior Living Arrangements/Services   Lives with:: Self Patient language and need for interpreter reviewed:: Yes Do you feel safe going back to the place where you live?: Yes      Need for Family Participation in Patient Care: No (Comment) Care giver support system in place?: Yes (comment)   Criminal Activity/Legal Involvement Pertinent to Current Situation/Hospitalization: No - Comment as needed  Activities of Daily Living      Permission Sought/Granted Permission sought to share  information with : Case Manager Permission granted to share information with : Yes, Verbal Permission Granted  Share Information with NAME: sister Earlie Server           Emotional Assessment Appearance:: Appears younger than stated age Attitude/Demeanor/Rapport: Engaged, Gracious Affect (typically observed): Accepting Orientation: : Oriented to Self, Oriented to  Time, Oriented to Place, Oriented to Situation Alcohol / Substance Use: Not Applicable Psych Involvement: No (comment)  Admission diagnosis:  Syncope and collapse [R55] Bradycardia [R00.1] Hypoglycemia [E16.2] Patient Active Problem List   Diagnosis Date Noted  . Abnormal serum level of alkaline phosphatase 01/18/2019  . Hypoalbuminemia 01/18/2019  . Left bundle branch block (LBBB) on electrocardiogram 01/18/2019  . Severely underweight adult 01/18/2019  . AKI (acute kidney injury) ([place]) 01/18/2019  . Bradycardia   . Hypoglycemia   . Loss of consciousness ([place]) 01/17/2019  . Unintentional weight loss 07/05/2017  . Dyslipidemia 11/12/2011  . Arrhythmia 05/07/2011  . ANXIETY 04/07/2007  . Depression 04/07/2007  . VENOUS INSUFFICIENCY 04/07/2007  . Dental caries 04/07/2007  . HEPATITIS B, HX OF 04/07/2007  . SYNCOPE, HX OF 04/07/2007  . Human immunodeficiency virus (HIV) disease ([place]) 02/25/2007   PCP:  Patient, No Pcp Per Pharmacy:   [id number] [place], [place] [address] [place] [address] Phone: [phone] Fax: [place], [place]  [place] [address] Phone: [phone] Fax: [phone]  [facility] [place], [place] [address] [place] [address] Phone: [phone] Fax: [phone]  [place], [place] [address] Phone: [phone] Fax: [phone]     Social Determinants of Health (SDOH) Interventions    Readmission Risk Interventions No flowsheet data found.

## 2019-01-18 NOTE — Progress Notes (Signed)
OT Cancellation Note  Patient Details Name: Charmain Diosdado MRN: [id number] DOB: 01/09/1931   Cancelled Treatment:    Reason Eval/Treat Not Completed: OT screened, no needs identified, will sign off. Per PT Pt at baseline - independent.   Merri Ray Fayez Sturgell 01/18/2019, 4:32 PM   Hulda Humphrey OTR/L Acute Rehabilitation Services Pager: [phone] Office: [phone]

## 2019-01-18 NOTE — Progress Notes (Signed)
FPTS Interim Progress Note  BP 132/72 (BP Location: Right Arm)   Pulse 66   Temp (!) 100.9 F (38.3 C) (Oral)   Resp 18   Ht 4\' 9"  (1.448 m)   Wt 33.3 kg   SpO2 97%   BMI 15.88 kg/m    Examined patient at bedside due to elevated temp of 100.11F. Patient without complaints and resting in bed. Denies pain. Did state she had about 3 loose bowel movements earlier today but this has been ongoing and is not bothered by this, denies blood. On admission had had dry cough but this seems to be better per patient.   Gen: patient is comfortable and lying in bed. Movement is not limited.  Cardiac: RRR, no murmur Lungs: CTAB, rales heard in RLL. No increased WOB. Appropriately saturated on RA. Abd: soft, NTND, no organomegaly, hypoactive BS  Unclear source of fever though with h/o uncontrolled HIV and CD4 <35, is at greatly increased risk for opportunistic infections. ART restarted and bactrim and azithromycin started for prophylaxis by ID today in the absence of any infectious symptoms. CXR earlier today clear of infiltrates and normal WOB without O2 requirement, could consider treatment dose of bactrim for PCP though this is not likely. Will obtain blood cultures but will hold off on treatment for now given reassuring exam and lack of other vital sign instability.  Rory Percy, DO 01/18/2019, 10:29 PM PGY-2, [facility] pager [phone]

## 2019-01-18 NOTE — Progress Notes (Signed)
  Echocardiogram 2D Echocardiogram has been performed.  Alison Ruiz 01/18/2019, 9:21 AM

## 2019-01-18 NOTE — Progress Notes (Addendum)
Initial Nutrition Assessment  RD working remotely.  DOCUMENTATION CODES:   Underweight  INTERVENTION:   -Downgrade diet to dysphagia 2 (mechanical soft) for ease of intake (pt has no teeth) -Continue MVI with minerals daily -Continue Ensure Enlive po TID, each supplement provides 350 kcal and 20 grams of protein -Provided "National Dysphagia Diet Mechanically Altered Nutrition Therapy" handout from [facility] Nutrition Care Manual; pasted information into pt's AVS/discharge summary  NUTRITION DIAGNOSIS:   Inadequate oral intake related to other (see comment)(masticatory difficulty) as evidenced by per patient/family report.  GOAL:   Patient will meet greater than or equal to 90% of their needs  MONITOR:   PO intake, Supplement acceptance, Labs, Weight trends, Skin, I & O's  REASON FOR ASSESSMENT:   Other (Comment)    ASSESSMENT:   Alison Ruiz is a 85 y.o. female presenting following a non-prodromal syncopal event likely multifactorial in etiology. PMH is significant for HIV not on chronic antiretrovirals, sinus arrhythmia, anxiety/depression, hyperlipidemia, resolved hepatitis B.  Pt admitted with syncope.   Reviewed I/O's: +213 ml x 24 hours  UOP: 900 ml x 24 hours  Spoke with pt over the phone, who was very pleasant and reports feeling better today. She reports she has a fair appetite, however, she has had declining intake over the past 2 years due to masticatory difficulty from lack of teeth. She reports she tried to consume as much of her breakfast as possible today, however, a lot of the food items were too hard for her. Pt reports she typically consumes 3 meals per day (Breakfast: bowl of oatmeal or cream of wheat, cup of juice, piece of toast softened with juice; Lunch: diced chicken bread and potato salad; Dinner: milkshake or smoothie from the local juice shop- estimate average of 780 kcals and 35 grams protein daily). Pt shares she was contemplating buying a food  processor to help grind her food, but has been able to do so due to shops being closed secondary to coronavirus outbreak.   Pt reports her UBW is around 90-92#, which she last weighed approximately 2 years ago. She is very concerned about her weight loss. Last documented wt was in 08/2017- pt has experienced a 17.6% wt loss over that time period. While this is not significant for time frame, it is concerning given decreased appetite and underweight status.   Discussed with pt how to increase calories and protein in diet given masticatory difficulty. Encouraged pt to add sauces and gravies to meat, as well as chop foods finely. Pt amenable to diet downgrade- current diet order of soft is a surgical soft diet (low fiber/low residue) designed for pts with chronic GI diseases or who recently underwent GI surgery. Due to chewing difficulty, a mechanical soft (dysphagia 2 diet) would be a more appropriate choice for this pt. Pt also requesting Ensure supplements, which have been ordered. Discussed importance of meal and supplement intake to promote healing. Pt very appreciative of RD assistance.   Suspect pt with malnutrition, however, unable to identify without completion of nutrition-focused physical exam.   Labs reviewed: CBGS: 120-138.   NUTRITION - FOCUSED PHYSICAL EXAM:    Most Recent Value  Orbital Region  Unable to assess  Upper Arm Region  Unable to assess  Thoracic and Lumbar Region  Unable to assess  Buccal Region  Unable to assess  Temple Region  Unable to assess  Clavicle Bone Region  Unable to assess  Clavicle and Acromion Bone Region  Unable to assess  Scapular  Bone Region  Unable to assess  Dorsal Hand  Unable to assess  Patellar Region  Unable to assess  Anterior Thigh Region  Unable to assess  Posterior Calf Region  Unable to assess  Edema (RD Assessment)  Unable to assess  Hair  Unable to assess  Eyes  Unable to assess  Mouth  Unable to assess  Skin  Unable to assess  Nails   Unable to assess       Diet Order:   Diet Order            DIET SOFT Room service appropriate? Yes; Fluid consistency: Thin  Diet effective now              EDUCATION NEEDS:   Education needs have been addressed  Skin:  Skin Assessment: Reviewed RN Assessment  Last BM:  01/17/19  Height:   Ht Readings from Last 1 Encounters:  01/17/19 4\' 9"  (1.448 m)    Weight:   Wt Readings from Last 1 Encounters:  01/17/19 33.3 kg    Ideal Body Weight:  43.2 kg  BMI:  Body mass index is 15.88 kg/m.  Estimated Nutritional Needs:   Kcal:  1150-1350  Protein:  55-70 grams  Fluid:  > 1.1 L    Alison Ruiz A. Jimmye Norman, RD, LDN, [place] Registered Dietitian II Certified Diabetes Care and Education Specialist Pager: [phone] After hours Pager: [phone]

## 2019-01-18 NOTE — Discharge Instructions (Addendum)
Dear Alison Ruiz,   Thank you for letting us participate in your care! In this section, you will find a brief hospital admission summary of why you were admitted to the hospital, what happened during your admission, your diagnosis/diagnoses, and recommended follow up.   You were admitted because you were experiencing dizziness and had an episode of falling out. This was likely due to low blood sugar and low blood pressure.   During your hospitalization, you were also restarted on HIV Anti-Retroviral Therapy. You will continue to follow with the Infectious Disease doctors as an outpatient. They will conitnue to address your diarrhea that you've been having over the last year. You were also started on "Bactrim", this is a prophylactic antibiotic that you should take every day until the Infectious disease doctors tell you to stop.   You will take prednisone for 4 more days after discharge. Please finish the whole course.   You have a new primary care doctor, your appointment with her is tomorrow, May 5th.   You were discharged with oxygen. You will likely not require this for a long period. You may have to use it during physical therapy sessions. Your primary care doctor will let you know when you do not have to take it any longer.  DOCTOR'S APPOINTMENT & FOLLOW UP CARE INSTRUCTIONS  Future Appointments  Date Time Provider [place]  01/25/2019 10:00 AM Isaac Bliss, Rayford Halsted, MD [facility]  02/07/2019  9:30 AM Michel Bickers, MD [facility]     Thank you for choosing [facility]! Take care and be well!  [facility]  [place], [place] [address] [phone]   -------------------------------------------------------------------------------------------------------------------------------------------------  [facility] Stay Proper nutrition can help your body  recover from illness and injury.   Foods and beverages high in protein, vitamins, and minerals help rebuild muscle loss, promote healing, & reduce fall risk.   In addition to eating healthy foods, a nutrition shake is an easy, delicious way to get the nutrition you need during and after your hospital stay  It is recommended that you continue to drink 2 bottles per day of:     Ensure Plus  for at least 1 month (30 days) after your hospital stay   Tips for adding a nutrition shake into your routine: As allowed, drink one with vitamins or medications instead of water or juice Enjoy one as a tasty mid-morning or afternoon snack Drink cold or make a milkshake out of it Drink one instead of milk with cereal or snacks Use as a coffee creamer   Available at the following grocery stores and pharmacies:           * [place] [phone]            For COUPONS visit: www.ensure.com/join or [email or web address]-may.[email or web address]/   Suggested Substitutions Ensure Plus = Boost Plus = Carnation Breakfast Essentials = Boost Compact Ensure Active Clear = Boost Breeze Glucerna Shake = Boost Glucose Control = Carnation Breakfast Essentials SUGAR FREE  National Dysphagia Diet Mechanically Altered Nutrition Therapy The purpose of this diet is to provide foods that can be  successfully and safely swallowed. This diet consists of foods that are easy to swallow because they are blended, chopped, grinded, or mashed so that they are easy to chew and swallow. A registered dietitian nutritionist can individualize this diet to provide some favorite food items in a modified form. Tips  Foods you eat should be cut into -inch size pieces. These pieces will require minimal chewing.  Serve moist foods to make foods easier to chew and swallow.   To keep foods  moist, add small amounts of gravy, sauce, vegetable juice or cooking water, fruit juice, milk, or half and half to foods. Moist foods are easier to swallow.  Foods in large chunks or foods that are too hard to be chewed thoroughly should be avoided.  Prepare quantities of favorite food items and freeze them in portion sizes for use later.  Reheat foods carefully so that a tough outer crust does not form on them. Foods Recommended Food Group Foods Recommended  Grains Soft pancakes, breads, sweet rolls, [place] pastries, [place] toasts well moistened, blended, chopped, or ground to less than -inch thick with syrup or sauce. Well-cooked chopped pasta, noodles, and bread dressing. Well-cooked noodles in sauce. Spaetzel or soft dumplings that have been moistened with butter or gravy. Pureed rice. Purchased pureed bread products. Cooked cereals with little texture, including oatmeal. Slightly moistened dry cereals with little texture such as corn or wheat flakes, and puffed rice. Unprocessed wheat bran stirred into cereals to provide fiber. Soft, moist cakes with icing dissolved in milk or juice. Soft moist cookies or cookies softened with milk, coffee, or other liquid.  Moist bread pudding with pieces less than  inch in size.  Protein Foods Prepared, moistened, and ground red meat, including beef, pork, or lamb. Moist meatballs, meat loaf.  Gravy and sauce for moisture. Pieces no larger than  inch. Prepared, moistened, and ground poultry (chicken or [place]). Gravy and sauce for moisture. Pieces no larger than  inch. Prepared, moistened, and ground fresh, frozen, or canned seafood, including fish (salmon, herring, and sardines), shrimp, lobster, clams, and scallops. Gravy and sauce for moisture. Moist fish loaf. Pieces no larger than  inch. Prepared, moistened and ground ham, sausages and deli meats; sauces and condiments for moisture. Pieces no larger than  inch. Tuna, egg, or meat salad  fillings without large chunks or hard-to-chew vegetables. Pieces no larger than  inch. Poached, scrambled, or soft-cooked eggs or egg substitute mashed with butter, margarine, sauce, or gravy. Souffls with small chunks of meat, fruit or vegetables.  Pieces no larger than  inch. Chopped casseroles with small chunks of meat, ground meats, or tender meats. Pieces no larger than  inch. Moist macaroni and cheese, well-cooked pasta with meat sauce, tuna-noodle casserole, soft and moist lasagna. Pieces no larger than  inch. Smooth nut and seed butters, such as peanut butter, almond butter, and sunflower seed butter as tolerated; and ok if used in tolerated recipes. Prepared, moistened, and ground soy foods, such as tofu or tempeh Prepared, moistened, and ground meat alternatives, such as veggie burgers, and sausages based on plant protein. Pieces no larger than  inch. Prepared, moistened, and mashed legumes, such as dried beans, lentils, or peas. Pieces no larger than  inch.  Dairy Milk, yogurt (without nuts or coconut), cottage cheese, cream cheese, sour cream, and cheeses. Frozen desserts made from milk such as pudding, custard, ice cream, sherbet, malts, and frozen yogurt. Fortified soymilk  Vegetables All well-cooked, soft, canned or frozen, tender vegetables less  than  inch in size including dark-green, red and orange vegetables, legumes (beans and peas), and starchy vegetables Moist, well-cooked, soft-boiled, baked, or mashed potatoes. Soft oven-baked tender potatoes or fries as tolerated. Pieces no larger than  inch. Vegetable juices.   Fruits All soft, (drained if indicated) canned and cooked fruits (without skin) if less than 1/4-inch thick. Gelatin with canned fruit (except pineapple) Fresh, ripe banana Fruit pie with bottom crust only; fruit crisp or cobbler without seeds or nuts and with soft crust or crumb topping 100% fruit juice Fruit ices  Oils Vegetable oils, including olive,  peanut, and canola oils; margarines and spreads; salad dressing and mayonnaise; butter, gravy, cream sauces (for the addition of moisture to foods).  Beverages Coffee, tea , water, 100% fruit juice in the consistency recommended by a SLP  Other Prepared foods, including all soups with tender meats, casseroles, baked goods, and snacks made from recommended ingredients. Soft sandwiches with salad-type filings with pieces  inch or less; and on allowed bread. All seasonings and sweeteners, including honey, jams, jellies, and preserves. Non-chewy candies without nuts, seeds, or coconut.   Foods Not Recommended   Food Group Foods Not Recommended  Grains All breads not listed in the recommended list including dry bread, toast, crackers, etc. Tough, crusty breads such as [place] bread or baguettes. Dry bread dressing. Rice that has not been pureed. Very coarse or dry whole grain cereals; cereals that contain nuts, seeds, dried fruit, or coconut. Dry or coarse cakes and cookies.  Protein Foods Tough or dry red meats (beef, pork, lamb) or poultry (chicken and [place]) Tough, dry fried meats, poultry, or fish. Tough, dry fish; fish with bones. Tough, dry deli or processed meats, such as bacon, pastrami and corned beef. Hard-cooked or crisp fried eggs Dry casseroles, casseroles with rice, or casseroles with large chunks. Chunky nut seed butters, unless used in a tolerated recipe. Whole nuts or seeds.  Dairy Cheese slices or cubes. Yogurt with nuts or coconut.    Vegetables All raw vegetables. Cooked corn. Mashed peas (unless tolerated). Undercooked vegetables that are fibrous, tough, or stringy such as cabbage, asparagus, and celery. Fried vegetables such as potato skins, potato chips or other crunchy vegetable chips, fried or french-fried potatoes, tough or crisp-fried potatoes.  Fruits Difficult to chew fresh fruits such as apples or pears. Stringy, high-pulp fruits such as papaya, pineapple, or  mango. Fresh fruits with difficult to chew peels such as grapes. All uncooked dried fruits such as prunes, raisins, apricots, or coconut. Fruit leather, fruit roll-ups, fruit snacks, dried fruits.  Oils  All fats with coarse, difficult to chew, or chunky additives such as cream cheese spread with nuts or pineapple.  Beverages Liquid consistencies other than those specified by the SLP.  Other Sandwiches with hard crusts. Pizza Chewy caramel, taffy-style candies, gummy candies, marshmallow candies, and chewing gum.  NDD Mechanically Altered Sample 1-Day Menu  Breakfast 1/2 cup orange juice, at prescribed consistency 1/2 cup oatmeal 1/4 cup low-fat milk, to moisten oatmeal 1 muffin 1 teaspoon butter, for muffin 1 soft, scrambled egg  Lunch 1/2 cup tomato soup, at prescribed consistency 3 crackers, slurried 1/2 cup moist potatoes 1/2 cup well-cooked mix of carrot and peas 1/4 cup tomato sauce, for meatloaf 1 moist cookie 1/2 cup vanilla pudding  Evening Meal 1/2 cup potato soup, at prescribed consistency 3 slurried crackers 1/2 cup well-cooked green beans, without strings 1 slice apple pie with moist crust 1/2 cup ice cream  Copyright 2020  Academy of Nutrition and Dietetics. All rights reserved.

## 2019-01-18 NOTE — Evaluation (Signed)
Physical Therapy Evaluation Patient Details Name: Alison Ruiz MRN: [id number] DOB: 1931/03/01 Today's Date: 01/18/2019   History of Present Illness  Alison Ruiz is a 85 y.o. female presenting following a non-prodromal syncopal event likely multifactorial in etiology. PMH is significant for HIV not on chronic antiretrovirals, sinus arrhythmia, anxiety/depression, hyperlipidemia, resolved hepatitis B.    Clinical Impression  Pt presented supine in bed with HOB elevated, awake and willing to participate in therapy session. Prior to admission, pt reported that she was independent with all functional mobility and ADLs. Pt continues to drive and works at [employer]. Pt lives alone in a single level home with two steps to enter. At the time of evaluation, pt at min guard to supervision level with all functional mobility without use of an AD. Pt denied any symptoms including dizziness, chest pain or shortness of breath. No further acute PT needs identified at this time. PT signing off.     Follow Up Recommendations No PT follow up    Equipment Recommendations  None recommended by PT    Recommendations for Other Services       Precautions / Restrictions Precautions Precautions: Fall Restrictions Weight Bearing Restrictions: No      Mobility  Bed Mobility Overal bed mobility: Modified Independent                Transfers Overall transfer level: Needs assistance Equipment used: None Transfers: Sit to/from Stand Sit to Stand: Supervision         General transfer comment: for safety  Ambulation/Gait Ambulation/Gait assistance: Min guard;Supervision Gait Distance (Feet): 200 Feet Assistive device: None Gait Pattern/deviations: Step-through pattern Gait velocity: decreased   General Gait Details: no instability or LOB, no need for UE supports or physical assistance, min guard and progressing to supervision for safety  Stairs            Wheelchair Mobility     Modified Rankin (Stroke Patients Only)       Balance Overall balance assessment: No apparent balance deficits (not formally assessed)                                           Pertinent Vitals/Pain Pain Assessment: No/denies pain    Home Living Family/patient expects to be discharged to:: Private residence Living Arrangements: Alone Available Help at Discharge: Neighbor;Available PRN/intermittently   Home Access: Stairs to enter Entrance Stairs-Rails: Right;Left Entrance Stairs-Number of Steps: 2 Home Layout: One level Home Equipment: None      Prior Function Level of Independence: Independent         Comments: drives     Hand Dominance        Extremity/Trunk Assessment   Upper Extremity Assessment Upper Extremity Assessment: Generalized weakness    Lower Extremity Assessment Lower Extremity Assessment: Generalized weakness    Cervical / Trunk Assessment Cervical / Trunk Assessment: Kyphotic  Communication   Communication: No difficulties  Cognition Arousal/Alertness: Awake/alert Behavior During Therapy: WFL for tasks assessed/performed Overall Cognitive Status: Within Functional Limits for tasks assessed                                        General Comments      Exercises     Assessment/Plan    PT Assessment Patent does not need  any further PT services  PT Problem List Decreased mobility;Decreased safety awareness       PT Treatment Interventions      PT Goals (Current goals can be found in the Care Plan section)  Acute Rehab PT Goals Patient Stated Goal: to go home PT Goal Formulation: All assessment and education complete, DC therapy    Frequency     Barriers to discharge        Co-evaluation               AM-PAC PT "6 Clicks" Mobility  Outcome Measure Help needed turning from your back to your side while in a flat bed without using bedrails?: None Help needed moving from lying on  your back to sitting on the side of a flat bed without using bedrails?: None Help needed moving to and from a bed to a chair (including a wheelchair)?: None Help needed standing up from a chair using your arms (e.g., wheelchair or bedside chair)?: None Help needed to walk in hospital room?: None Help needed climbing 3-5 steps with a railing? : None 6 Click Score: 24    End of Session Equipment Utilized During Treatment: Gait belt Activity Tolerance: Patient tolerated treatment well Patient left: in bed;with call bell/phone within reach Nurse Communication: Mobility status PT Visit Diagnosis: Other abnormalities of gait and mobility (R26.89)    Time: 1610-9604 PT Time Calculation (min) (ACUTE ONLY): 12 min   Charges:   PT Evaluation $PT Eval Low Complexity: [place], PT, DPT  Acute Rehabilitation Services Pager [phone] Office [place] 01/18/2019, 3:51 PM

## 2019-01-18 NOTE — Care Management Obs Status (Signed)
[place]   Patient Details  Name: Alison Ruiz MRN: [id number] Date of Birth: Sep 01, 1931   Medicare Observation Status Notification Given:  Yes(telephone consent; letter read in detail, all questions answered)    Royston Bake, RN 01/18/2019, 11:00 AM

## 2019-01-18 NOTE — Progress Notes (Signed)
Inpatient Diabetes Program Recommendations  AACE/ADA: New Consensus Statement on Inpatient Glycemic Control (2015)  Target Ranges:  Prepandial:   less than 140 mg/dL      Peak postprandial:   less than 180 mg/dL (1-2 hours)      Critically ill patients:  140 - 180 mg/dL   Lab Results  Component Value Date   GLUCAP 76 01/18/2019   HGBA1C 6.5 (H) 01/17/2019    Results for KEISHAWNA, CARRANZA (MRN [id number]) as of 01/18/2019 08:20  Ref. Range 01/17/2019 13:52 01/17/2019 14:05 01/17/2019 14:44 01/17/2019 17:32 01/17/2019 20:42 01/18/2019 06:50  Glucose-Capillary Latest Ref Range: 70 - 99 mg/dL 44 (LL) 270 (H) 267 (H) 120 (H) 138 (H) 76      Review of Glycemic Control  Diabetes history: none  Outpatient Diabetes medications: none  Current orders for Inpatient glycemic control: none   Pt with hypoglycemia on admission. No DM history. Hgb A1c = 6.5% (no prior value listed).   MD - with Hgb A1c of 6.5% is this a new diabetes diagnosis?  Per chart, patient does not have a PCP. Entered case management consult to assist with follow up appointment.  Note Ensure alive supplement ordered BID.    -- Will follow during hospitalization.--  Jonna Clark RN, MSN Diabetes Coordinator Inpatient Glycemic Control Team Team Pager: [phone] (8am-5pm)

## 2019-01-18 NOTE — Discharge Summary (Signed)
[facility] Discharge Summary  Patient name: Alison Ruiz Medical record number: [id number] Date of birth: [birth year] Age: 85 y.o. Gender: female Date of Admission: 01/17/2019  Date of Discharge: 01/24/19 Admitting Physician: Blane Ohara McDiarmid, MD  Primary Care Provider: Patient, No Pcp Per Consultants: ID, cards  Indication for Hospitalization: syncope  Discharge Diagnoses/Problem List:  Syncope Dehydration  HIV  Disposition: Home with home health  Discharge Condition: Stable  Discharge Exam:  General: NAD, non-toxic, well-appearing, sitting comfortably in chair.  Cardiovascular: RRR, normal S1, S2. 2+ RP bilaterally. No BLEE.   Respiratory: CTAB. No IWOB.  Abdomen: + BS. NT, ND, soft to palpation.  Extremities: Warm and well perfused. Moving spontaneously   Brief Hospital Course:  Patient admitted after one episode of syncope without acute injury. No reoccurance of syncope. Positive orthostatics and bradycardia which both improved with some IV fluids. Echo was obtained and shoed 45-50% EF with valvulopathies. Syncope did not recur, but patient did develop a new oxygen requirement. She is not on O2 at baseline. She continued to work with PT. Work up was negative for infection. ID was consulted in the setting of her low CD4 count and HART nonadherence. They placed her back on HART and recommended lasix for possible pulmonary edema. Patient had 500cc UOP. It was thought that since patient was restarted on HART, that she was experiencing IRIS and was discharged on a prednisone burst and DME O2. She is was also discharged with ppx bactrim given her low CD4. She is to follow up with ID outpatient. PT was consulted and recommened HH with PT. Patient was discharged home, where she lives by herself. She will likely not require O2 for more than a couple of weeks.   On admission, patient also complained of chronic diarrhea for the last two years. ID was consulted for  diarrhea in the setting of low CD4. They did not recommend any work up and anticipate that her diarrhea will resolve with continuing HART.    Issues for Follow Up:  1. Will need follow up for HIV with ID 2. Would recommend patient obtain dentures to increase PO intake 3. Would consider doing an iron challenge  Significant Procedures:  ECHO   1. The left ventricle has mildly reduced systolic function, with an ejection fraction of 45-50%. The cavity size was decreased. There is mildly increased left ventricular wall thickness.  2. 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  3. The right ventricle has mildly reduced systolic function. The cavity was mildly enlarged. There is no increase in right ventricular wall thickness.  4. Right atrial size was mildly dilated.  5. Small pericardial effusion.  6. The mitral valve is abnormal. Mild thickening of the mitral valve leaflet.  7. Tricuspid valve regurgitation is mild-moderate.  8. The aortic valve is tricuspid Mild thickening of the aortic valve. Aortic valve regurgitation is mild by color flow Doppler.  9. The aortic root is normal in size and structure. Significant Labs and Imaging:  Recent Labs  Lab 01/22/19 0600 01/23/19 0549 01/24/19 0341  WBC 4.3 8.2  8.8 12.3*  HGB 9.6* 9.9*  9.7* 9.7*  HCT 31.0* 30.8*  30.9* 30.2*  PLT 208 239  223 281   Recent Labs  Lab 01/20/19 0345 01/21/19 0246 01/22/19 0600 01/23/19 0549 01/24/19 0341  NA 142 140 141 140 141  K 3.7 3.6 4.4 4.2 4.5  CL 111 112* 108 107 107  CO2 22 21* 23 22 21*  GLUCOSE  94 125* 142* 135* 89  BUN 20 22 21  33* 40*  CREATININE 1.20* 1.10* 1.07* 1.38* 1.21*  CALCIUM 8.6* 8.4* 8.8* 9.4 9.7    Results/Tests Pending at Time of Discharge: none  Discharge Medications:  Allergies as of 01/24/2019   No Known Allergies     Medication List    STOP taking these medications   ibuprofen 200 MG tablet Commonly known as:  ADVIL     TAKE these medications    Darunavir-Cobicisctat-Emtricitabine-Tenofovir Alafenamide 800-150-200-10 MG Tabs Commonly known as:  Symtuza Take 1 tablet by mouth daily with breakfast.   dolutegravir 50 MG tablet Commonly known as:  Tivicay Take 1 tablet (50 mg total) by mouth daily.   feeding supplement (ENSURE ENLIVE) Liqd Take 237 mLs by mouth 3 (three) times daily between meals.   ferrous sulfate 325 (65 FE) MG tablet Take 1 tablet (325 mg total) by mouth daily for 30 days.   multivitamin with minerals Tabs tablet Take 1 tablet by mouth daily for 30 days.   ondansetron 4 MG tablet Commonly known as:  ZOFRAN Take 1 tablet (4 mg total) by mouth every 6 (six) hours as needed for nausea.   predniSONE 20 MG tablet Commonly known as:  DELTASONE Take 1 tablet (20 mg total) by mouth daily with breakfast for 4 days.   psyllium 95 % Pack Commonly known as:  HYDROCIL/METAMUCIL Take 1 packet by mouth daily.   sulfamethoxazole-trimethoprim 800-160 MG tablet Commonly known as:  BACTRIM DS Take 1 tablet by mouth daily.   sulfamethoxazole-trimethoprim 800-160 MG tablet Commonly known as:  BACTRIM DS Take 1 tablet by mouth daily.       Discharge Instructions: Please refer to Patient Instructions section of EMR for full details.  Patient was counseled important signs and symptoms that should prompt return to medical care, changes in medications, dietary instructions, activity restrictions, and follow up appointments.   Follow-Up Appointments: Follow-up Information    Isaac Bliss, Rayford Halsted, MD Follow up on 01/25/2019.   Specialty:  Internal Medicine Why:  @ [facility]. This will most likely be a virtual/ telephonic visit at 10:00am. The office will reach out to you before this time to make a final determination on method of appointment.  Contact information: [place] [address] [phone]        Michel Bickers, MD Follow up.   Specialty:  Infectious Diseases Why:   02/07/19 at 9:30am.  Contact information: [address] [place] [address] [phone]        [facility] Follow up.   Specialty:  Home Health Services Why:  They will do your home health care at your home       [facility] Follow up.   Why:  They will provide your home oxygen Contact information: [place] [address] [phone]            Wilber Oliphant, MD 01/25/2019, 8:32 PM PGY-1, [facility]

## 2019-01-18 NOTE — Progress Notes (Addendum)
Family Medicine Teaching Service Daily Progress Note Intern Pager: [phone]  Patient name: Alison Ruiz Medical record number: [id number] Date of birth: [birth year] Age: 85 y.o. Gender: female  Primary Care Provider: Patient, No Pcp Per Consultants: none Code Status: full  Pt Overview and Major Events to Date:  4/28- admitted  Assessment and Plan: Alison Ruiz is a 85 y.o. female presenting following a non-prodromal syncopal event likely multifactorial in etiology. PMH is significant for HIV NOT on chronic antiretrovirals, sinus arrhythmia, anxiety/depression, hyperlipidemia, resolved hepatitis B   Syncopal event, likely multifactorial: Acute, stable- (hypoglycemia, bradycardia, orthostatic, vaso-vagal, dehydration). no more syncopal events since admission. Patient has not been standing.TSH wnl. IV fluids completed. Patient able to tolerate some soft foods this morning. Requested vanilla ensure. Orthostatic vitals pending this am. Glucose 80 and 76 this am. No abdominal pain, N/V, SOB, CP. - Consult cardiology and ID today, appreciate recommendations - Monitor CBGs - Monitor telemetry - CBC, CMP - Repeat orthostatics prior to DC - f/u PT/OT evaluation - echo - walk with pulse ox to monitor heart rate    Sinus bradycardia w/ h/o arrhythmia: Stable. Patient bradycardic overnight 49-66. EKG showing sinus bradycardia with LBBB/PVC's. -Consult cardiology/EP, appreciate recommendations as above -Monitor telemetry  Hypoglycemia: Acute, now stable- 76 today. hgb A1c 6.5 this admission -CBGs before meals and nightly  HIV not on chronic antivirals: Chronic- has not been on any prophylactic Bactrim or antiretroviral therapy in the past 2 years due to expense. Previously on [place]. -Consult ID, appreciate recommendations - F/U CD4 count and HIV RNA  AKI vs CKD- Cr 1.19 on admit>0.94 today. BUN 23. mIV fluids have been discontinued. - Monitor BMP -Avoid nephrotoxic  medications as possible  Normocytic anemia: Chronic, stable. hgb 10.3, MCV 86.2, reticulocyte count wnl, ferritin 480, iron 17, TIBC wnl - Monitor CBC  Elevated alkaline phosphatase: Acute.- improved from 293 on admission to 265 today.  -Consider GGT if remains elevated  Protein calorie malnutrition: Likely chronic. Albumin 2.4 on admission to 2.1 today. Frail female, last documented weight 40 kg. 33.3kg on admission. Patient reports poor oral intake for the past two years after losing her denture. She requested ensure and was able to tolerate some soft foods. - f/u nutrition recs - Ensure TID  Anxiety  depression: Chronic, stable.  Well-managed, not on any controlling medication. Patient does not appear agitated or depressed mood this morning. - Monitor for symptoms  History of hepatitis B: Stable. Remote infection cleared per hepatitis B serologies in 2013: Hep B core and surface Ab positive, surface Ag negative.   FEN/GI: Soft diet,ensure Prophylaxis: Lovenox  Disposition: cardiac telemetry  Subjective:  Patient denies reoccurrence of syncopal episodes. She has not been up again since admission. She has no pain or complaints this morning. She was able to eat some of her breakfast. She requested ensure. Willing to work with PT and see ID today.   Objective: Temp:  [97.5 F (36.4 C)-98.7 F (37.1 C)] 98.7 F (37.1 C) (04/29 0406) Pulse Rate:  [49-66] 59 (04/29 0406) Resp:  [15-20] 18 (04/28 2300) BP: (110-140)/(68-96) 134/72 (04/29 0406) SpO2:  [88 %-100 %] 88 % (04/29 0406) Weight:  [33.3 kg] 33.3 kg (04/28 1842) Physical Exam: General: frail-appearing, NAD Cardiovascular: RRR, no murmur appreciated Respiratory: CTAB, no wheezing or increased WOB Abdomen: scaphoid, non-tender Extremities: no LE edema Psych: normal mood and affect  Laboratory: Recent Labs  Lab 01/17/19 1404 01/17/19 1913 01/18/19 0409  WBC 4.0 4.1 4.6  HGB 11.2*  11.1* 10.3*  HCT 36.7  35.1* 33.1*  PLT 178 195 190   Recent Labs  Lab 01/17/19 1404 01/17/19 1913 01/18/19 0409  NA 138  --  140  K 4.1  --  4.0  CL 105  --  109  CO2 20*  --  20*  BUN 25*  --  23  CREATININE 1.19* 1.03* 0.94  CALCIUM 9.0  --  8.6*  PROT 6.6  --  5.6*  BILITOT 0.6  --  0.5  ALKPHOS 293*  --  265*  ALT 36  --  28  AST 58*  --  36  GLUCOSE 289*  --  80   HIV pending B12 266 TSH wnl  Imaging/Diagnostic Tests: Dg Chest 2 View  Result Date: 01/17/2019 CLINICAL DATA:  Cough and syncope EXAM: CHEST - 2 VIEW COMPARISON:  None. FINDINGS: There is an apparent nipple shadow on the left. There is no edema or consolidation. Heart is mildly enlarged with pulmonary vascularity normal. No adenopathy. There is aortic atherosclerosis. No bone lesions. IMPRESSION: No edema or consolidation. Mild cardiomegaly. Aortic Atherosclerosis (ICD10-I70.0). Electronically Signed   By: Lowella Grip III M.D.   On: 01/17/2019 14:37    Richarda Osmond, DO 01/18/2019, 7:04 AM PGY-1, [place] Intern pager: [phone], text pages welcome

## 2019-01-18 NOTE — Progress Notes (Signed)
HIV Genotype Composite Data Genotype Dates:   Mutations in Bold impact drug susceptibility RT Mutations M41L, D67N, K70R, M184V, K101E, G190A  PI Mutations M46L  Integrase Mutations    Interpretation of Genotype Data per [employer] HIV Database Nucleoside RTIs  abacavir (ABC) Intermediate Resistance zidovudine (AZT)  Intermediate Resistance emtricitabine (FTC)  High-Level Resistance lamivudine (3TC)  High-Level Resistance tenofovir (TDF)  Susceptible   Non-Nucleoside RTIs  efavirenz (EFV)  High-Level Resistance etravirine (ETR)  Intermediate Resistance nevirapine (NVP) High-Level Resistance rilpivirine (RPV) High-Level Resistance   Protease Inhibitors  atazanavir/r (ATV/r)           Potential Low-Level Resistance darunavir/r (DRV/r)           Susceptible lopinavir/r (LPV/r)            Potential Low-Level Resistance   Integrase Inhibitors     Trofile: Non-reportable  Previous Regimen:  2009 - HLA neg  COMBIVIR 150-300 MG TABS (LAMIVUDINE-ZIDOVUDINE) Take 1 tablet by mouth two times a day VIREAD 300 MG TABS (TENOFOVIR DISOPROXIL FUMARATE) Take 1 tablet by mouth once a day LEXIVA 700 MG TABS (FOSAMPRENAVIR CALCIUM) Take 1 tablet by mouth two times a day NORVIR 100 MG CAPS (RITONAVIR) Take 1 tablet by mouth two times a day ------------------------------------------------------------------------------------------------------------ COMBIVIR 150-300 MG TABS (LAMIVUDINE-ZIDOVUDINE) Take 1 tablet by mouth two times a day INTELENCE 100 MG TABS (ETRAVIRINE) Take2 tablets by mouth two times a day PREZISTA 300 MG TABS (DARUNAVIR) Take 2 tablets by mouth twice a day NORVIR 100 MG TABS (RITONAVIR) Take 1 tablet by mouth two times a day    Jimmy Footman, PharmD, BCPS, BCIDP Infectious Diseases Clinical Pharmacist Phone: [phone] 01/18/2019 1:38 PM

## 2019-01-19 ENCOUNTER — Inpatient Hospital Stay (HOSPITAL_COMMUNITY): Payer: Medicare Other

## 2019-01-19 ENCOUNTER — Other Ambulatory Visit: Payer: Self-pay

## 2019-01-19 ENCOUNTER — Encounter: Payer: Self-pay | Admitting: Internal Medicine

## 2019-01-19 ENCOUNTER — Encounter (HOSPITAL_COMMUNITY): Payer: Self-pay | Admitting: General Practice

## 2019-01-19 ENCOUNTER — Telehealth: Payer: Self-pay | Admitting: Pharmacy Technician

## 2019-01-19 DIAGNOSIS — I361 Nonrheumatic tricuspid (valve) insufficiency: Secondary | ICD-10-CM | POA: Diagnosis not present

## 2019-01-19 DIAGNOSIS — Z681 Body mass index (BMI) 19 or less, adult: Secondary | ICD-10-CM | POA: Diagnosis not present

## 2019-01-19 DIAGNOSIS — B20 Human immunodeficiency virus [HIV] disease: Secondary | ICD-10-CM | POA: Diagnosis present

## 2019-01-19 DIAGNOSIS — Z91120 Patient's intentional underdosing of medication regimen due to financial hardship: Secondary | ICD-10-CM | POA: Diagnosis not present

## 2019-01-19 DIAGNOSIS — Z79899 Other long term (current) drug therapy: Secondary | ICD-10-CM | POA: Diagnosis not present

## 2019-01-19 DIAGNOSIS — I447 Left bundle-branch block, unspecified: Secondary | ICD-10-CM | POA: Diagnosis present

## 2019-01-19 DIAGNOSIS — J90 Pleural effusion, not elsewhere classified: Secondary | ICD-10-CM | POA: Diagnosis present

## 2019-01-19 DIAGNOSIS — E860 Dehydration: Secondary | ICD-10-CM | POA: Diagnosis present

## 2019-01-19 DIAGNOSIS — I351 Nonrheumatic aortic (valve) insufficiency: Secondary | ICD-10-CM | POA: Diagnosis not present

## 2019-01-19 DIAGNOSIS — R55 Syncope and collapse: Secondary | ICD-10-CM | POA: Diagnosis present

## 2019-01-19 DIAGNOSIS — E162 Hypoglycemia, unspecified: Secondary | ICD-10-CM | POA: Diagnosis present

## 2019-01-19 DIAGNOSIS — R509 Fever, unspecified: Secondary | ICD-10-CM | POA: Diagnosis not present

## 2019-01-19 DIAGNOSIS — Z8619 Personal history of other infectious and parasitic diseases: Secondary | ICD-10-CM | POA: Diagnosis not present

## 2019-01-19 DIAGNOSIS — E43 Unspecified severe protein-calorie malnutrition: Secondary | ICD-10-CM | POA: Diagnosis present

## 2019-01-19 DIAGNOSIS — I951 Orthostatic hypotension: Secondary | ICD-10-CM | POA: Diagnosis present

## 2019-01-19 DIAGNOSIS — Z21 Asymptomatic human immunodeficiency virus [HIV] infection status: Secondary | ICD-10-CM | POA: Diagnosis not present

## 2019-01-19 DIAGNOSIS — Z9119 Patient's noncompliance with other medical treatment and regimen: Secondary | ICD-10-CM | POA: Diagnosis not present

## 2019-01-19 DIAGNOSIS — T375X6A Underdosing of antiviral drugs, initial encounter: Secondary | ICD-10-CM | POA: Diagnosis present

## 2019-01-19 DIAGNOSIS — R001 Bradycardia, unspecified: Secondary | ICD-10-CM | POA: Diagnosis present

## 2019-01-19 DIAGNOSIS — R64 Cachexia: Secondary | ICD-10-CM | POA: Diagnosis present

## 2019-01-19 DIAGNOSIS — N179 Acute kidney failure, unspecified: Secondary | ICD-10-CM | POA: Diagnosis present

## 2019-01-19 DIAGNOSIS — D509 Iron deficiency anemia, unspecified: Secondary | ICD-10-CM | POA: Diagnosis present

## 2019-01-19 DIAGNOSIS — E785 Hyperlipidemia, unspecified: Secondary | ICD-10-CM | POA: Diagnosis present

## 2019-01-19 DIAGNOSIS — Z830 Family history of human immunodeficiency virus [HIV] disease: Secondary | ICD-10-CM | POA: Diagnosis not present

## 2019-01-19 DIAGNOSIS — R0902 Hypoxemia: Secondary | ICD-10-CM | POA: Diagnosis not present

## 2019-01-19 DIAGNOSIS — I872 Venous insufficiency (chronic) (peripheral): Secondary | ICD-10-CM | POA: Diagnosis present

## 2019-01-19 DIAGNOSIS — Z9114 Patient's other noncompliance with medication regimen: Secondary | ICD-10-CM | POA: Diagnosis not present

## 2019-01-19 DIAGNOSIS — Z20828 Contact with and (suspected) exposure to other viral communicable diseases: Secondary | ICD-10-CM | POA: Diagnosis present

## 2019-01-19 DIAGNOSIS — F418 Other specified anxiety disorders: Secondary | ICD-10-CM | POA: Diagnosis present

## 2019-01-19 DIAGNOSIS — R634 Abnormal weight loss: Secondary | ICD-10-CM | POA: Diagnosis not present

## 2019-01-19 DIAGNOSIS — R748 Abnormal levels of other serum enzymes: Secondary | ICD-10-CM | POA: Diagnosis present

## 2019-01-19 DIAGNOSIS — R4020 Unspecified coma: Secondary | ICD-10-CM | POA: Diagnosis not present

## 2019-01-19 LAB — GLUCOSE, CAPILLARY
Glucose-Capillary: 91 mg/dL (ref 70–99)
Glucose-Capillary: 77 mg/dL (ref 70–99)
Glucose-Capillary: 113 mg/dL — ABNORMAL HIGH (ref 70–99)
Glucose-Capillary: 103 mg/dL — ABNORMAL HIGH (ref 70–99)
Glucose-Capillary: 100 mg/dL — ABNORMAL HIGH (ref 70–99)

## 2019-01-19 LAB — CBC WITH DIFFERENTIAL/PLATELET
Abs Immature Granulocytes: 0.03 10*3/uL (ref 0.00–0.07)
Basophils Absolute: 0 10*3/uL (ref 0.0–0.1)
Basophils Relative: 0 %
Eosinophils Absolute: 0 10*3/uL (ref 0.0–0.5)
Eosinophils Relative: 0 %
HCT: 31.2 % — ABNORMAL LOW (ref 36.0–46.0)
Hemoglobin: 9.7 g/dL — ABNORMAL LOW (ref 12.0–15.0)
Immature Granulocytes: 1 %
Lymphocytes Relative: 14 %
Lymphs Abs: 0.7 10*3/uL (ref 0.7–4.0)
MCH: 26.8 pg (ref 26.0–34.0)
MCHC: 31.1 g/dL (ref 30.0–36.0)
MCV: 86.2 fL (ref 80.0–100.0)
Monocytes Absolute: 0.4 10*3/uL (ref 0.1–1.0)
Monocytes Relative: 9 %
Neutro Abs: 3.6 10*3/uL (ref 1.7–7.7)
Neutrophils Relative %: 76 %
Platelets: 184 10*3/uL (ref 150–400)
RBC: 3.62 MIL/uL — ABNORMAL LOW (ref 3.87–5.11)
RDW: 15.4 % (ref 11.5–15.5)
WBC: 4.8 10*3/uL (ref 4.0–10.5)
nRBC: 0 % (ref 0.0–0.2)

## 2019-01-19 LAB — BASIC METABOLIC PANEL
Anion gap: 11 (ref 5–15)
BUN: 20 mg/dL (ref 8–23)
CO2: 22 mmol/L (ref 22–32)
Calcium: 8.3 mg/dL — ABNORMAL LOW (ref 8.9–10.3)
Chloride: 107 mmol/L (ref 98–111)
Creatinine, Ser: 1.09 mg/dL — ABNORMAL HIGH (ref 0.44–1.00)
GFR calc Af Amer: 54 mL/min — ABNORMAL LOW (ref >60)
GFR calc non Af Amer: 46 mL/min — ABNORMAL LOW (ref >60)
Glucose, Bld: 92 mg/dL (ref 70–99)
Potassium: 3.9 mmol/L (ref 3.5–5.1)
Sodium: 140 mmol/L (ref 135–145)

## 2019-01-19 LAB — HIV-1 RNA QUANT-NO REFLEX-BLD
HIV 1 RNA Quant: 92200 copies/mL
LOG10 HIV-1 RNA: 4.965 log10copy/mL

## 2019-01-19 LAB — MRSA PCR SCREENING: MRSA by PCR: NEGATIVE

## 2019-01-19 MED ORDER — DARUNAVIR-COBICISTAT 800-150 MG PO TABS
1.0000 | ORAL_TABLET | Freq: Every day | ORAL | Status: DC
  Administered 2019-01-20 – 2019-01-24 (×5): 1 via ORAL
  Filled 2019-01-19 (×5): qty 1

## 2019-01-19 MED ORDER — VANCOMYCIN HCL IN DEXTROSE 1-5 GM/200ML-% IV SOLN
1000.0000 mg | Freq: Once | INTRAVENOUS | Status: AC
  Administered 2019-01-19: 1000 mg via INTRAVENOUS
  Filled 2019-01-19: qty 200

## 2019-01-19 MED ORDER — SODIUM CHLORIDE 0.9 % IV SOLN
INTRAVENOUS | Status: DC | PRN
  Administered 2019-01-19: 1000 mL via INTRAVENOUS

## 2019-01-19 MED ORDER — SODIUM CHLORIDE 0.9 % IV SOLN
INTRAVENOUS | Status: DC | PRN
  Administered 2019-01-19: 15:00:00 via INTRAVENOUS

## 2019-01-19 MED ORDER — VANCOMYCIN HCL 500 MG IV SOLR: 500.0000 mg | INTRAVENOUS | Status: DC

## 2019-01-19 MED ORDER — PIPERACILLIN-TAZOBACTAM 3.375 G IVPB
3.3750 g | Freq: Three times a day (TID) | INTRAVENOUS | Status: DC
  Administered 2019-01-19 (×3): 3.375 g via INTRAVENOUS
  Filled 2019-01-19 (×4): qty 50

## 2019-01-19 MED ORDER — EMTRICITABINE-TENOFOVIR AF 200-25 MG PO TABS
1.0000 | ORAL_TABLET | Freq: Every day | ORAL | Status: DC
  Administered 2019-01-20 – 2019-01-24 (×5): 1 via ORAL
  Filled 2019-01-19 (×5): qty 1

## 2019-01-19 MED ORDER — ORAL CARE MOUTH RINSE
15.0000 mL | Freq: Two times a day (BID) | OROMUCOSAL | Status: DC
  Administered 2019-01-20 – 2019-01-24 (×7): 15 mL via OROMUCOSAL

## 2019-01-19 NOTE — Progress Notes (Addendum)
Nutrition Follow-up  DOCUMENTATION CODES:   Severe malnutrition in context of chronic illness, Underweight  INTERVENTION:   -Continue Ensure Enlive po TID, each supplement provides 350 kcal and 20 grams of protein -Magic Cup TID with meals, each supplement provides 290 kcals and 9 grams protein -Continue MVI with minerals daily -Continue with dysphagia 2 diet  NUTRITION DIAGNOSIS:   Severe Malnutrition related to chronic illness(HIV) as evidenced by severe fat depletion, energy intake < 75% for > or equal to 1 month, severe muscle depletion.  Ongoing  GOAL:   Patient will meet greater than or equal to 90% of their needs  Progressing  MONITOR:   PO intake, Supplement acceptance, Labs, Weight trends, Skin, I & O's  REASON FOR ASSESSMENT:   Consult Assessment of nutrition requirement/status  ASSESSMENT:   Alison Ruiz is a 85 y.o. female presenting following a non-prodromal syncopal event likely multifactorial in etiology. PMH is significant for HIV not on chronic antiretrovirals, sinus arrhythmia, anxiety/depression, hyperlipidemia, resolved hepatitis B.  Reviewed I/O's: +129 ml x 24 hours and +342 ml since admission  UOP: 825 ml x 24 hours  Spoke with pt at bedside, who reports feeling better today. She was recently delivered lunch; noted pt consumed about 50% of Magic Cup and a few bites of sweet potatoes off tray. She reports that she is tolerating dysphagia 2 diet a lot better due to softer food textures (pt with no teeth). She has been consuming her Ensure supplements (observed pt's breakfast tray earlier today- consumed 100% of applesauce and 90% of Ensure supplement).   Per diet recall obtained yesterday, estimated average intake was 780 kcals and 35 grams protein daily, which meets approximately 68% of estimated kcal needs.  Albumin has a half-life of 21 days and is strongly affected by stress response and inflammatory process, therefore, do not expect to see an  improvement in this lab value during acute hospitalization. When a patient presents with low albumin, it is likely skewed due to the acute inflammatory response. Note that low albumin is no longer used to diagnose malnutrition; [place] uses the new malnutrition guidelines published by the American Society for Parenteral and Enteral Nutrition (A.S.P.E.N.) and the Academy of Nutrition and Dietetics (AND).    Labs reviewed: CBGS: 91.   NUTRITION - FOCUSED PHYSICAL EXAM:    Most Recent Value  Orbital Region  Severe depletion  Upper Arm Region  Severe depletion  Thoracic and Lumbar Region  Severe depletion  Buccal Region  Severe depletion  Temple Region  Severe depletion  Clavicle Bone Region  Severe depletion  Clavicle and Acromion Bone Region  Severe depletion  Scapular Bone Region  Severe depletion  Dorsal Hand  Severe depletion  Patellar Region  Severe depletion  Anterior Thigh Region  Severe depletion  Posterior Calf Region  Severe depletion  Edema (RD Assessment)  None  Hair  Reviewed  Eyes  Reviewed  Mouth  Reviewed  Skin  Reviewed  Nails  Reviewed       Diet Order:   Diet Order            DIET DYS 2 Room service appropriate? Yes; Fluid consistency: Thin  Diet effective now              EDUCATION NEEDS:   Education needs have been addressed  Skin:  Skin Assessment: Reviewed RN Assessment  Last BM:  01/19/19  Height:   Ht Readings from Last 1 Encounters:  01/17/19 4\' 9"  (1.448 m)  Weight:   Wt Readings from Last 1 Encounters:  01/19/19 34.2 kg    Ideal Body Weight:  43.2 kg  BMI:  Body mass index is 16.32 kg/m.  Estimated Nutritional Needs:   Kcal:  1150-1350  Protein:  55-70 grams  Fluid:  > 1.1 L    Nefi Musich A. Jimmye Norman, RD, LDN, [place] Registered Dietitian II Certified Diabetes Care and Education Specialist Pager: [phone] After hours Pager: [phone]

## 2019-01-19 NOTE — Progress Notes (Signed)
Pt not able to walk for ambulating O2, said she feels too weak.

## 2019-01-19 NOTE — Plan of Care (Signed)

## 2019-01-19 NOTE — Progress Notes (Signed)
Family Medicine Teaching Service Daily Progress Note Intern Pager: [phone]  Patient name: Alison Ruiz Medical record number: [id number] Date of birth: [birth year] Age: 85 y.o. Gender: female  Primary Care Provider: Patient, No Pcp Per Consultants: none Code Status: full  Pt Overview and Major Events to Date:  4/28- admitted  Assessment and Plan: Alison Ruiz is a 85 y.o. female presenting following a non-prodromal syncopal event likely multifactorial in etiology. PMH is significant for HIV NOT on chronic antiretrovirals, sinus arrhythmia, anxiety/depression, hyperlipidemia, resolved hepatitis B  Fever overnight, concern for infection Zosyn started overnight following fever up to 102.1.  No obvious source of infection at this time.  Vitals remained stable.  Given her history of poorly controlled HIV, there is concern for opportunistic infection.  Physical exam this morning was remarkable for rales in the right lower lobe on auscultation. -Follow-up chest x-ray two-view -Infectious disease following, appreciate recommendations -Continue Vanco, Zosyn  Syncopal event, likely multifactorial: Acute, stable Unclear etiology at this time.  Echocardiogram performed 4/29, read pending.  Orthostatic vitals remain unremarkable.  PT/OT recommended no further interventions/follow-up. - Consult cardiology and ID today, appreciate recommendations - Follow-up echo - walk with pulse ox to monitor heart rate    Sinus bradycardia w/ h/o arrhythmia: Stable.   No bradycardia noted overnight. -Monitor telemetry  Hypoglycemia: Acute, now stable- 76 today. hgb A1c 6.5 this admission -CBGs before meals and nightly  HIV not on chronic antivirals: Chronic ID working to obtain a 30-day supply of antiretrovirals. -Descovy daily -Tivicay daily -Prophylactic azithromycin daily -Prophylactic Bactrim daily  AKI vs CKD, improving Creatinine 1.19 on admission.  Downtrending since admission.  Creatinine  1.09 (4/30). - Monitor BMP -Avoid nephrotoxic medications as possible  Normocytic anemia: Chronic, stable.  Most recent baseline appears to be around 11.  Hemoglobin this morning is 9.7.  No obvious source of bleeding.  No hematochezia, melena, hematemesis, vaginal bleeding. - Monitor CBC  Elevated alkaline phosphatase: Acute.- improved from 293 on admission to 265 today.  GGT within normal limits, indicating this is possibly a non-hepatic source.  Protein calorie malnutrition: Likely chronic. Prealbumin 7.4.  Cachectic appearance. - f/u nutrition recs - Ensure TID  Anxiety  depression: Chronic, stable.  Well-managed, not on any controlling medication. Patient does not appear agitated or depressed mood this morning. - Monitor for symptoms  History of hepatitis B: Stable. Remote infection cleared per hepatitis B serologies in 2013: Hep B core and surface Ab positive, surface Ag negative.   FEN/GI: Soft diet,ensure Prophylaxis: Lovenox  Disposition: cardiac telemetry  Subjective:  She did report that she had fever overnight was started on IV antibiotics.  This morning, she feels well with no significant discomfort/complaints.  She specifically denied shortness of breath, chest pain, dysuria, vaginal discharge.  Objective: Temp:  [98.2 F (36.8 C)-102.1 F (38.9 C)] 98.6 F (37 C) (04/30 0420) Pulse Rate:  [64-74] 64 (04/30 0420) Resp:  [18-20] 18 (04/30 0420) BP: (95-136)/(60-82) 95/60 (04/30 0420) SpO2:  [90 %-97 %] 93 % (04/30 0420) Weight:  [34.2 kg] 34.2 kg (04/30 0420)  General: Cachectic appearing woman lying in bed comfortably.  No acute distress HEENT: Neck non-tender without lymphadenopathy, masses or thyromegaly Cardio: Normal S1 and S2, no S3 or S4. Rhythm is regular. No murmurs or rubs.   Pulm: Breathing comfortably on room air.  Rales noted in right lower lung field.  No wheezing/stridor appreciated. Abdomen: Bowel sounds normal. Abdomen soft and  non-tender.  Extremities: No peripheral edema. Warm/ well perfused.  Strong radial pulses. Neuro: Cranial nerves grossly intact   Laboratory: Recent Labs  Lab 01/17/19 1404 01/17/19 1913 01/18/19 0409  WBC 4.0 4.1 4.6  HGB 11.2* 11.1* 10.3*  HCT 36.7 35.1* 33.1*  PLT 178 195 190   Recent Labs  Lab 01/17/19 1404 01/17/19 1913 01/18/19 0409  NA 138  --  140  K 4.1  --  4.0  CL 105  --  109  CO2 20*  --  20*  BUN 25*  --  23  CREATININE 1.19* 1.03* 0.94  CALCIUM 9.0  --  8.6*  PROT 6.6  --  5.6*  BILITOT 0.6  --  0.5  ALKPHOS 293*  --  265*  ALT 36  --  28  AST 58*  --  36  GLUCOSE 289*  --  80    Imaging/Diagnostic Tests: No results found.   Matilde Haymaker, MD 01/19/2019, 6:01 AM PGY-1, [place] Intern pager: [phone], text pages welcome

## 2019-01-19 NOTE — Progress Notes (Signed)
Patient got up to [facility], got very weak and almost unresponsive. Patient had positive orthostatic VS. BP when back in bed is 154/86. Patient is now more alert and taking. MD notified.

## 2019-01-19 NOTE — Progress Notes (Signed)
[facility] Infectious Disease  Date of Admission:  01/17/2019     Total days of antibiotics 1         ASSESSMENT/PLAN  Ms. Cumberledge is a 85 y/o female with history of HIV/AIDS, Hepatitis B and depression who has been out of care for her HIV since October 2018 and admitted with syncopal episode lasting 5 minutes likely related to poor oral intake and dehydration.  AIDS - Restarted on ART with Symtuza and Tivicay and tolerating medication without complications. Able to restart medication assistance program and in process of completing her paperwork. Continue current Symtuza and Tivicay with Bactrim for OI prophylaxis.   Unintentional Weight Loss - Eating and drinking has improved some. Encourage fluids and Ensure. Weight should hopefully improve with restarting of ART medications as well.   Fever - Fever today of unclear origin although remains asymptomatic at present. There is no clinical evidence of pneumonia. Will discontinue vancomycin and piperacillin-tazobactam for now as there is a chance this is likely evolving from restarting ART therapy. Continue to monitor blood cultures and fever curve. If additional fever occurs will pursue further work up.  Syncope - No further episodes of syncope since arrival to the hospital and feeling better today.    Principal Problem:   Loss of consciousness ([place]) Active Problems:   Human immunodeficiency virus (HIV) disease ([place])   Unintentional weight loss   Abnormal serum level of alkaline phosphatase   Hypoalbuminemia   Left bundle branch block (LBBB) on electrocardiogram   Severely underweight adult   AKI (acute kidney injury) (HCC)   Bradycardia   Hypoglycemia   Syncope   Protein-calorie malnutrition, severe   . [START ON 01/20/2019] emtricitabine-tenofovir AF  1 tablet Oral Q breakfast   And  . [START ON 01/20/2019] darunavir-cobicistat  1 tablet Oral Q breakfast  . dolutegravir  50 mg Oral Daily  . enoxaparin (LOVENOX) injection   30 mg Subcutaneous Q24H  . feeding supplement (ENSURE ENLIVE)  1 Bottle Oral TID BM  . mouth rinse  15 mL Mouth Rinse BID  . multivitamin with minerals  1 tablet Oral Daily  . sulfamethoxazole-trimethoprim  1 tablet Oral Daily    SUBJECTIVE:  Febrile overnight with no acute events. Started on broad spectrum antibiotics. Ms. Montfort is doing well and has no current respiratory, urinary, or abdominal symptoms. Does not feel feverish or have shortness of breath or body aches. Feeling better today.   No Known Allergies   Review of Systems: Review of Systems  Constitutional: Positive for fever. Negative for chills and weight loss.  Respiratory: Negative for cough, shortness of breath and wheezing.   Cardiovascular: Negative for chest pain and leg swelling.  Gastrointestinal: Negative for abdominal pain, constipation, diarrhea, nausea and vomiting.  Genitourinary: Negative for flank pain, frequency, hematuria and urgency.  Skin: Negative for rash.      OBJECTIVE: Vitals:   01/19/19 1018 01/19/19 1212 01/19/19 1212 01/19/19 1623  BP: 130/69 113/67 113/67 98/63  Pulse:  64 65 94  Resp:  18 18 18   Temp:  98.4 F (36.9 C) 98.4 F (36.9 C) 99.9 F (37.7 C)  TempSrc:  Oral Oral Oral  SpO2:  93% 93% 93%  Weight:      Height:       Body mass index is 16.32 kg/m.  Physical Exam Constitutional:      General: She is not in acute distress.    Appearance: She is cachectic. She is ill-appearing.  Interventions: Nasal cannula in place.  Cardiovascular:     Rate and Rhythm: Normal rate and regular rhythm.     Heart sounds: Normal heart sounds.  Pulmonary:     Effort: Pulmonary effort is normal. No respiratory distress.     Breath sounds: Normal breath sounds. No wheezing, rhonchi or rales.  Skin:    General: Skin is warm and dry.  Neurological:     Mental Status: She is alert and oriented to person, place, and time.  Psychiatric:        Mood and Affect: Mood normal.      Lab Results Lab Results  Component Value Date   WBC 4.8 01/19/2019   HGB 9.7 (L) 01/19/2019   HCT 31.2 (L) 01/19/2019   MCV 86.2 01/19/2019   PLT 184 01/19/2019    Lab Results  Component Value Date   CREATININE 1.09 (H) 01/19/2019   BUN 20 01/19/2019   NA 140 01/19/2019   K 3.9 01/19/2019   CL 107 01/19/2019   CO2 22 01/19/2019    Lab Results  Component Value Date   ALT 28 01/18/2019   AST 36 01/18/2019   ALKPHOS 265 (H) 01/18/2019   BILITOT 0.5 01/18/2019     Microbiology: Recent Results (from the past 240 hour(s))  Culture, blood (routine x 2)     Status: None (Preliminary result)   Collection Time: 01/18/19 10:44 PM  Result Value Ref Range Status   Specimen Description BLOOD LEFT HAND  Final   Special Requests   Final    BOTTLES DRAWN AEROBIC ONLY Blood Culture adequate volume   Culture   Final    NO GROWTH < 12 HOURS Performed at [facility], [address] [place], [place] [address]    Report Status PENDING  Incomplete  Culture, blood (routine x 2)     Status: None (Preliminary result)   Collection Time: 01/18/19 10:45 PM  Result Value Ref Range Status   Specimen Description BLOOD RIGHT HAND  Final   Special Requests   Final    BOTTLES DRAWN AEROBIC AND ANAEROBIC Blood Culture adequate volume   Culture   Final    NO GROWTH < 12 HOURS Performed at [facility], [place] [address], [place], [place] [address]    Report Status PENDING  Incomplete  MRSA PCR Screening     Status: None   Collection Time: 01/19/19 10:46 AM  Result Value Ref Range Status   MRSA by PCR NEGATIVE NEGATIVE Final    Comment:        The GeneXpert MRSA Assay (FDA approved for NASAL specimens only), is one component of a comprehensive MRSA colonization surveillance program. It is not intended to diagnose MRSA infection nor to guide or monitor treatment for MRSA infections. Performed at [facility], [place] [address], [place], [place] [address]      Terri Piedra,  [place] for [place] [phone] Pager  01/19/2019  5:17 PM

## 2019-01-19 NOTE — Progress Notes (Signed)
Oral temp 102.1. Blood cultures drawn and pending. Tylenol PO given after blood cultures. VS stable. MD paged. Will continue to monitor.

## 2019-01-19 NOTE — Progress Notes (Signed)
Pharmacy Antibiotic Note  Alison Ruiz is a 85 y.o. female admitted on 01/17/2019 with syncopy now with worsening fever.  Pharmacy has been consulted for vancomycin and zosyn dosing.  HIV w/ CD4 <35 - on Tivicay, Descovy, Bactrim, azithro SCr 0.94, CrCl~23 ml/min  Plan: Vancomycin 1000mg  IV x1, then 500mg  IV every 48h (calc AUC 443, SCr 0.94) Zosyn 3.375g IV every 8 hours F/u renal function, Cx and clinical progression to narrow Vancomycin levels at steady state  Height: 4\' 9"  (144.8 cm) Weight: 73 lb 6.4 oz (33.3 kg) IBW/kg (Calculated) : 38.6  Temp (24hrs), Avg:99.7 F (37.6 C), Min:98.2 F (36.8 C), Max:102.1 F (38.9 C)  Recent Labs  Lab 01/17/19 1404 01/17/19 1913 01/18/19 0409  WBC 4.0 4.1 4.6  CREATININE 1.19* 1.03* 0.94    Estimated Creatinine Clearance: 23 mL/min (by C-G formula based on SCr of 0.94 mg/dL).    No Known Allergies  Antimicrobials this admission: Vanc 4/30>> Zosyn 4/30>>  Dose adjustments this admission: n/a  Microbiology results: 4/30 BCx: sent  Bertis Ruddy, PharmD Clinical Pharmacist Please check AMION for all [place] numbers 01/19/2019 12:49 AM

## 2019-01-19 NOTE — Progress Notes (Signed)
MD informed of expiring tele orders he reported did not want to continue tele monitoring let order expire.

## 2019-01-19 NOTE — Progress Notes (Signed)
FPTS Interim Progress Note  Evaluated patient after presyncopal episode with fall while transitioning from bed to bedside commode. Patient did not lose consciousness, states she felt dizzy. Did not sustain injury. At that time, vitals were stable a part from brief O2 desat, improved with [place]. CBG 103.   BP (!) 143/76 (BP Location: Right Arm)   Pulse 63   Temp 99.5 F (37.5 C) (Oral)   Resp 18   Ht 4\' 9"  (1.448 m)   Wt 34.2 kg Comment: scale b  SpO2 98%   BMI 16.32 kg/m    On exam, patient states she just feels weak.   Gen: She is oriented and able to move around in bed without assistance.  Cardiac: RRR, no mrg Lungs: bibasilar crackles,R>L Abd: soft, NTND, +BS Ext: warm and well perfused, no LE edema  Believe presyncopal episode due to general deconditioning, BP ok and now slightly hypertensive. Initial AKI improved, will peel back IVF 2/2 concern for increasing fluid in lungs, new O2 requirement, and CXR with increased edema. ECHO pending. Instructed patient to call for assistance should she need to use bedside commode, no current indication for purewick or foley. Would also recommend PT continue to work with her each day and reevaluate needs for ongoing strengthening.   Rory Percy, DO 01/19/2019, 8:11 PM PGY-2, [place] Medicine Service pager [phone]

## 2019-01-19 NOTE — Telephone Encounter (Signed)
[facility] Patient Advocate Encounter   I was successful in securing patient a $7500 grant from [facility] [place] (PAF) to provide copayment coverage for [place] and [employer].  This will make the out of pocket cost $0.     I have spoken with the patient.    The billing information is as follows and has been shared with [place].   Patient: Alison Ruiz Median: HIV, AIDS, and Prevention Through 01/19/2020 Cardholder: [id number] BIN: [id number] PCN: [id number] Group: [id number]  For pharmacy inquiries, contact PDMI at [phone]. For patient inquiries, contact PAF at [phone].  Patient knows to call the office with questions or concerns.  Alison Ruiz. Alison Ruiz [place] [facility] Phone: [phone] Fax:  [phone]

## 2019-01-19 NOTE — Progress Notes (Signed)
Patient having diarrhea, no stomach ache. She states she's had it for months. Patient is on antibiotic therapy as well. No fever so far during this shift. MD notified.

## 2019-01-20 ENCOUNTER — Inpatient Hospital Stay (HOSPITAL_COMMUNITY): Payer: Medicare Other

## 2019-01-20 DIAGNOSIS — I351 Nonrheumatic aortic (valve) insufficiency: Secondary | ICD-10-CM

## 2019-01-20 DIAGNOSIS — I361 Nonrheumatic tricuspid (valve) insufficiency: Secondary | ICD-10-CM

## 2019-01-20 LAB — ECHOCARDIOGRAM LIMITED
Height: 57 in
Weight: 1176 oz

## 2019-01-20 LAB — CBC
HCT: 29.9 % — ABNORMAL LOW (ref 36.0–46.0)
Hemoglobin: 9.6 g/dL — ABNORMAL LOW (ref 12.0–15.0)
MCH: 27.4 pg (ref 26.0–34.0)
MCHC: 32.1 g/dL (ref 30.0–36.0)
MCV: 85.2 fL (ref 80.0–100.0)
Platelets: 181 10*3/uL (ref 150–400)
RBC: 3.51 MIL/uL — ABNORMAL LOW (ref 3.87–5.11)
RDW: 15.5 % (ref 11.5–15.5)
WBC: 5.8 10*3/uL (ref 4.0–10.5)
nRBC: 0 % (ref 0.0–0.2)

## 2019-01-20 LAB — GLUCOSE, CAPILLARY
Glucose-Capillary: 173 mg/dL — ABNORMAL HIGH (ref 70–99)
Glucose-Capillary: 44 mg/dL — CL (ref 70–99)
Glucose-Capillary: 78 mg/dL (ref 70–99)
Glucose-Capillary: 79 mg/dL (ref 70–99)
Glucose-Capillary: 81 mg/dL (ref 70–99)
Glucose-Capillary: 99 mg/dL (ref 70–99)

## 2019-01-20 LAB — BASIC METABOLIC PANEL
Anion gap: 9 (ref 5–15)
BUN: 20 mg/dL (ref 8–23)
CO2: 22 mmol/L (ref 22–32)
Calcium: 8.6 mg/dL — ABNORMAL LOW (ref 8.9–10.3)
Chloride: 111 mmol/L (ref 98–111)
Creatinine, Ser: 1.2 mg/dL — ABNORMAL HIGH (ref 0.44–1.00)
GFR calc Af Amer: 48 mL/min — ABNORMAL LOW (ref >60)
GFR calc non Af Amer: 41 mL/min — ABNORMAL LOW (ref >60)
Glucose, Bld: 94 mg/dL (ref 70–99)
Potassium: 3.7 mmol/L (ref 3.5–5.1)
Sodium: 142 mmol/L (ref 135–145)

## 2019-01-20 LAB — ACID FAST SMEAR (AFB, MYCOBACTERIA)

## 2019-01-20 MED ORDER — ADULT MULTIVITAMIN W/MINERALS CH: 1.0000 | ORAL_TABLET | Freq: Every day | ORAL | 0 refills | Status: DC

## 2019-01-20 MED ORDER — ACETAMINOPHEN 325 MG PO TABS: 325.0000 mg | ORAL_TABLET | Freq: Four times a day (QID) | ORAL | 0 refills | Status: DC | PRN

## 2019-01-20 MED ORDER — ONDANSETRON HCL 4 MG PO TABS: 4.0000 mg | ORAL_TABLET | Freq: Four times a day (QID) | ORAL | 0 refills | Status: DC | PRN

## 2019-01-20 MED ORDER — FERROUS SULFATE 325 (65 FE) MG PO TABS: 325.0000 mg | ORAL_TABLET | Freq: Every day | ORAL | 0 refills | Status: DC

## 2019-01-20 MED ORDER — ENSURE ENLIVE PO LIQD: 1.0000 | Freq: Three times a day (TID) | ORAL | 12 refills | Status: AC

## 2019-01-20 MED FILL — ONDANSETRON HCL 4 MG TABLET: 4 | 20 days supply | Qty: 20 | Fill #0

## 2019-01-20 MED FILL — FERROUS SULFATE 325 MG TAB: 325 (65 FE) | 30 days supply | Qty: 30 | Fill #0

## 2019-01-20 MED FILL — ACETAMINOPHEN 325 MG TABS: 325 | 30 days supply | Qty: 120 | Fill #0

## 2019-01-20 NOTE — Evaluation (Signed)
Physical Therapy Evaluation Patient Details Name: Alison Ruiz MRN: [id number] DOB: 11-02-1930 Today's Date: 01/20/2019   History of Present Illness  Alison Ruiz is a 85 y.o. female presenting following a non-prodromal syncopal event likely multifactorial in etiology. PMH is significant for HIV not on chronic antiretrovirals, sinus arrhythmia, anxiety/depression, hyperlipidemia, resolved hepatitis B. Of note pt with another witness very near syncopal episode in which she became unresponsive while admitted on 01/19/19 and PT re-consulted.    Clinical Impression  Pt presented supine in bed with HOB elevated, awake and willing to participate in therapy session. Prior to admission, pt reported that she was independent with all functional mobility and ADLs. Pt limited this session secondary to generalized weakness and fatigue. Pt with very loose BM on BSC during session. Pt reported that she is still unable to maintain food in her system without very quickly having a BM afterwards. BP stable and WNL. SPO2 however decreasing to 87% with activity with pt on 2L. At rest with pt on 3L, SPO2 increased to low 90's. PT will continue to follow acutely to progress mobility as tolerated.     Follow Up Recommendations No PT follow up    Equipment Recommendations  None recommended by PT    Recommendations for Other Services       Precautions / Restrictions Precautions Precautions: Fall Restrictions Weight Bearing Restrictions: No      Mobility  Bed Mobility Overal bed mobility: Modified Independent             General bed mobility comments: HOB elevated, use of bed rail  Transfers Overall transfer level: Needs assistance Equipment used: None Transfers: Sit to/from Stand;Stand Pivot Transfers Sit to Stand: Min guard Stand pivot transfers: Min guard       General transfer comment: min guard for safety  Ambulation/Gait             General Gait Details: pt deferring after having  BM on BSC - feeling "too weak"  Stairs            Wheelchair Mobility    Modified Rankin (Stroke Patients Only)       Balance Overall balance assessment: Needs assistance Sitting-balance support: Feet supported Sitting balance-Leahy Scale: Good     Standing balance support: No upper extremity supported Standing balance-Leahy Scale: Fair                               Pertinent Vitals/Pain Pain Assessment: No/denies pain    Home Living Family/patient expects to be discharged to:: Private residence Living Arrangements: Alone Available Help at Discharge: Neighbor;Available PRN/intermittently Type of Home: Apartment Home Access: Stairs to enter Entrance Stairs-Rails: Psychiatric nurse of Steps: 2 Home Layout: One level Home Equipment: None      Prior Function Level of Independence: Independent         Comments: drives, works     Journalist, newspaper        Extremity/Trunk Assessment   Upper Extremity Assessment Upper Extremity Assessment: Generalized weakness    Lower Extremity Assessment Lower Extremity Assessment: Generalized weakness    Cervical / Trunk Assessment Cervical / Trunk Assessment: Kyphotic  Communication   Communication: No difficulties  Cognition Arousal/Alertness: Awake/alert Behavior During Therapy: WFL for tasks assessed/performed Overall Cognitive Status: Within Functional Limits for tasks assessed  General Comments      Exercises     Assessment/Plan    PT Assessment Patient needs continued PT services  PT Problem List Decreased strength;Decreased activity tolerance;Decreased balance;Decreased mobility;Decreased coordination       PT Treatment Interventions DME instruction;Gait training;Stair training;Therapeutic activities;Therapeutic exercise;Functional mobility training;Balance training;Neuromuscular re-education;Patient/family education     PT Goals (Current goals can be found in the Care Plan section)  Acute Rehab PT Goals Patient Stated Goal: to go home PT Goal Formulation: With patient Time For Goal Achievement: 02/03/19 Potential to Achieve Goals: Good    Frequency Min 3X/week   Barriers to discharge        Co-evaluation               AM-PAC PT "6 Clicks" Mobility  Outcome Measure Help needed turning from your back to your side while in a flat bed without using bedrails?: None Help needed moving from lying on your back to sitting on the side of a flat bed without using bedrails?: None Help needed moving to and from a bed to a chair (including a wheelchair)?: None Help needed standing up from a chair using your arms (e.g., wheelchair or bedside chair)?: None Help needed to walk in hospital room?: None Help needed climbing 3-5 steps with a railing? : A Little 6 Click Score: 23    End of Session   Activity Tolerance: Patient tolerated treatment well Patient left: in bed;with call bell/phone within reach;with bed alarm set Nurse Communication: Mobility status PT Visit Diagnosis: Other abnormalities of gait and mobility (R26.89)    Time: 7017-7939 PT Time Calculation (min) (ACUTE ONLY): 12 min   Charges:   PT Evaluation $PT Eval Moderate Complexity: 1 Mod          Sherie Don, PT, DPT  Acute Rehabilitation Services Pager [phone] Office [place] 01/20/2019, 4:18 PM

## 2019-01-20 NOTE — Progress Notes (Signed)
Family Medicine Teaching Service Daily Progress Note Intern Pager: [phone]  Patient name: Alison Ruiz Medical record number: [id number] Date of birth: [birth year] Age: 85 y.o. Gender: female  Primary Care Provider: Patient, No Pcp Per Consultants: none Code Status: full  Pt Overview and Major Events to Date:  4/28- admitted  Assessment and Plan: Alison Ruiz is a 85 y.o. female presenting following a non-prodromal syncopal event likely multifactorial in etiology. PMH is significant for HIV NOT on chronic antiretrovirals, sinus arrhythmia, anxiety/depression, hyperlipidemia, resolved hepatitis B  Fever overnight on 4/29 Low concern for infection at this time.  Discussed with infectious disease.  Potentially related to the restarting of ART therapy.  Antibiotics have been discontinued we will continue to monitor at this time.  Will investigate if any further indication of ongoing infection.  Of note, she continues to note about 4 loose stools per day for the past month. -Follow-up stool O&P -Follow-up stool culture -Infectious disease following, appreciate recommendations  Syncopal event, likely multifactorial: Acute, stable She had a near syncopal episode overnight.  It appears to be related to position changes from the bed to standing in order to move to the bedside commode.  This appears increasingly to have a large orthostatic hypotension component - Follow-up echo - walk with pulse ox to monitor heart rate    Sinus bradycardia w/ h/o arrhythmia: Stable.   No bradycardia noted overnight. -Monitor telemetry  Hypoglycemia: Acute, now stable -CBGs before meals and nightly  HIV not on chronic antivirals: Chronic ID working to obtain a 30-day supply of antiretrovirals.  Due to years of poor medication compliance, the virus now has an impressive drug resistance profile. -Descovy daily -Tivicay daily -Prophylactic azithromycin daily -Prophylactic Bactrim daily  AKI vs CKD,  worsening Baseline creatinine 0.7.  Creatinine 1.19 on admission.  Creatinine 1.2 on the morning of 5/1. - Monitor BMP -Avoid nephrotoxic medications as possible  Normocytic anemia: Chronic, stable.  Stable hemoglobin this morning at 9.6 (5/1) - Monitor CBC  Elevated alkaline phosphatase: Acute.- improved from 293 on admission to 265 today.  GGT within normal limits, indicating this is possibly a non-hepatic source. -No further investigation at this time.  Protein calorie malnutrition: Likely chronic. Recommendations made, nutrition supplementation ordered. - Nutrition following - Ensure TID  Anxiety  depression: Chronic, stable.  Well-managed, not on any controlling medication. Patient does not appear agitated or depressed mood this morning. - Monitor for symptoms  History of hepatitis B: Stable. Remote infection cleared per hepatitis B serologies in 2013: Hep B core and surface Ab positive, surface Ag negative.   FEN/GI: Soft diet,ensure Prophylaxis: Lovenox  Disposition: cardiac telemetry  Subjective:  Following her near syncopal episode last night, she has been feeling well.  She has no specific complaints this morning.  She specifically denied chest pain, headache, lightheadedness.  She reports that she gets nervous about eating because she knows that she will have more loose stools.  Objective: Temp:  [97.2 F (36.2 C)-100.2 F (37.9 C)] 98.4 F (36.9 C) (05/01 0402) Pulse Rate:  [56-94] 69 (05/01 0402) Resp:  [16-18] 18 (05/01 0402) BP: (98-143)/(58-76) 103/58 (05/01 0402) SpO2:  [75 %-98 %] 96 % (05/01 0402) Weight:  [33.3 kg] 33.3 kg (05/01 0358)  General: Alert and cooperative and appears to be in no acute distress.  Cachectic. HEENT: No appreciable JVD Cardio: Normal S1 and S2, no S3 or S4. Rhythm is regular.  No murmurs/rubs/gallops Pulm: Normal respiratory effort on room air.  Rales noted in right  lower field.  No wheezing/stridor. Abdomen: Bowel  sounds normal. Abdomen soft and non-tender.  Extremities: No peripheral edema. Warm/ well perfused.  Strong radial and pedal pulses. Neuro: Cranial nerves grossly intact   Laboratory: Recent Labs  Lab 01/18/19 0409 01/19/19 0722 01/20/19 0345  WBC 4.6 4.8 5.8  HGB 10.3* 9.7* 9.6*  HCT 33.1* 31.2* 29.9*  PLT 190 184 181   Recent Labs  Lab 01/17/19 1404  01/18/19 0409 01/19/19 0722 01/20/19 0345  NA 138  --  140 140 142  K 4.1  --  4.0 3.9 3.7  CL 105  --  109 107 111  CO2 20*  --  20* 22 22  BUN 25*  --  23 20 20   CREATININE 1.19*   < > 0.94 1.09* 1.20*  CALCIUM 9.0  --  8.6* 8.3* 8.6*  PROT 6.6  --  5.6*  --   --   BILITOT 0.6  --  0.5  --   --   ALKPHOS 293*  --  265*  --   --   ALT 36  --  28  --   --   AST 58*  --  36  --   --   GLUCOSE 289*  --  80 92 94   < > = values in this interval not displayed.    Imaging/Diagnostic Tests: Dg Chest 2 View  Result Date: 01/19/2019 CLINICAL DATA:  Fever. EXAM: CHEST - 2 VIEW COMPARISON:  Radiographs of January 17, 2019. FINDINGS: Stable cardiomegaly. Atherosclerosis of thoracic aorta is noted. No pneumothorax or pleural effusion is noted. Mildly increased bilateral perihilar opacities are noted which may represent edema or possibly pneumonia. Bony thorax is unremarkable. IMPRESSION: Mildly increased bilateral perihilar opacities are noted concerning for possible pneumonia or edema. Aortic Atherosclerosis (ICD10-I70.0). Electronically Signed   By: Marijo Conception M.D.   On: 01/19/2019 09:46     Matilde Haymaker, MD 01/20/2019, 5:41 AM PGY-1, [place] Intern pager: [phone], text pages welcome

## 2019-01-20 NOTE — Progress Notes (Signed)
[facility] for Infectious Disease  Date of Admission:  01/17/2019     Total days of antibiotics 1         ASSESSMENT/PLAN  Alison Ruiz is a 85 y/o female with history of HIV/AIDS, Hepatitis B and depression who has been out of care for her HIV since October 2018 and admitted with syncopal episode lasting 5 minutes likely related to poor oral intake and dehydration.  AIDS - Viral load of 92,000 with CD4 count of <35. Has successfully restarted her ART therapy with Symtuza and Tivicay with Bactrim for OI prophylaxis. She has a 30 day supply medication to go home and is covered for the next 3 months. Will schedule office visit for follow up. Continue current dose of Symtuza, Tivicay and Bactrim while in the hospital.  Unintentional Weight Loss - Appetite appears to be improved and eating supplemented with Ensure. Encouraged to continue.  Fever - She has remained afebrile and asymptomatic with no clinical indications of infection. Recommend to continue to monitor off antibiotics.   Syncope - No further episodes and likely related to poor oral intake. Follow up per primary team.    Principal Problem:   Loss of consciousness ([place]) Active Problems:   Human immunodeficiency virus (HIV) disease ([place])   Unintentional weight loss   Abnormal serum level of alkaline phosphatase   Hypoalbuminemia   Left bundle branch block (LBBB) on electrocardiogram   Severely underweight adult   AKI (acute kidney injury) (HCC)   Bradycardia   Hypoglycemia   Syncope   Protein-calorie malnutrition, severe   . emtricitabine-tenofovir AF  1 tablet Oral Q breakfast   And  . darunavir-cobicistat  1 tablet Oral Q breakfast  . dolutegravir  50 mg Oral Daily  . enoxaparin (LOVENOX) injection  30 mg Subcutaneous Q24H  . feeding supplement (ENSURE ENLIVE)  1 Bottle Oral TID BM  . mouth rinse  15 mL Mouth Rinse BID  . multivitamin with minerals  1 tablet Oral Daily  . sulfamethoxazole-trimethoprim  1  tablet Oral Daily    SUBJECTIVE:  Afebrile overnight with no acute events. Remains off antibiotics at this time. Expresses concern about going home today and would feel more comfortable to go home tomorrow. PT evaluation noted with no indication for further treatment.   No Known Allergies   Review of Systems: Review of Systems  Constitutional: Negative for chills, fever and weight loss.  Respiratory: Negative for cough, shortness of breath and wheezing.   Cardiovascular: Negative for chest pain and leg swelling.  Gastrointestinal: Negative for abdominal pain, constipation, diarrhea, nausea and vomiting.  Skin: Negative for rash.      OBJECTIVE: Vitals:   01/20/19 0402 01/20/19 0859 01/20/19 0905 01/20/19 1634  BP: (!) 103/58 119/69  (!) 118/107  Pulse: 69 72  70  Resp: 18 18  18   Temp: 98.4 F (36.9 C) 99.4 F (37.4 C)  98.7 F (37.1 C)  TempSrc: Oral Oral  Oral  SpO2: 96% (!) 81% 94% 100%  Weight:      Height:       Body mass index is 15.91 kg/m.  Physical Exam Constitutional:      General: She is not in acute distress.    Appearance: She is well-developed and underweight. She is ill-appearing.     Interventions: Nasal cannula in place.     Comments: Lying in bed with head of bed elevated.   Cardiovascular:     Rate and Rhythm: Normal rate and regular  rhythm.     Heart sounds: Normal heart sounds.  Pulmonary:     Effort: Pulmonary effort is normal.     Breath sounds: Normal breath sounds.  Skin:    General: Skin is warm and dry.  Neurological:     Mental Status: She is alert and oriented to person, place, and time.  Psychiatric:        Behavior: Behavior normal.        Thought Content: Thought content normal.        Judgment: Judgment normal.     Lab Results Lab Results  Component Value Date   WBC 5.8 01/20/2019   HGB 9.6 (L) 01/20/2019   HCT 29.9 (L) 01/20/2019   MCV 85.2 01/20/2019   PLT 181 01/20/2019    Lab Results  Component Value Date    CREATININE 1.20 (H) 01/20/2019   BUN 20 01/20/2019   NA 142 01/20/2019   K 3.7 01/20/2019   CL 111 01/20/2019   CO2 22 01/20/2019    Lab Results  Component Value Date   ALT 28 01/18/2019   AST 36 01/18/2019   ALKPHOS 265 (H) 01/18/2019   BILITOT 0.5 01/18/2019     Microbiology: Recent Results (from the past 240 hour(s))  Acid Fast Smear (AFB)     Status: None   Collection Time: 01/18/19  3:30 PM  Result Value Ref Range Status   AFB Specimen Processing Concentration  Final    Comment: (NOTE) Performed At: [facility] [place], [place] [id number] Rush Farmer MD UV:[id number]    Acid Fast Smear NOSMR  Final    Comment: (NOTE) The acid-fast bacilli (AFB) smear will not be performed due to the specimen source (blood) or submission of an insufficient quantity of specimen.    Source (AFB) BLOOD  Final    Comment: ARTERIAL BLOOD Performed at [facility], [place] [address], [place], [place] [address]   Culture, blood (routine x 2)     Status: None (Preliminary result)   Collection Time: 01/18/19 10:44 PM  Result Value Ref Range Status   Specimen Description BLOOD LEFT HAND  Final   Special Requests   Final    BOTTLES DRAWN AEROBIC ONLY Blood Culture adequate volume   Culture   Final    NO GROWTH 2 DAYS Performed at [facility], [place] [address], [place], [place] [address]    Report Status PENDING  Incomplete  Culture, blood (routine x 2)     Status: None (Preliminary result)   Collection Time: 01/18/19 10:45 PM  Result Value Ref Range Status   Specimen Description BLOOD RIGHT HAND  Final   Special Requests   Final    BOTTLES DRAWN AEROBIC AND ANAEROBIC Blood Culture adequate volume   Culture   Final    NO GROWTH 2 DAYS Performed at [facility], [place] [address], [place], [place] [address]    Report Status PENDING  Incomplete  MRSA PCR Screening     Status: None   Collection Time: 01/19/19 10:46 AM  Result Value Ref Range  Status   MRSA by PCR NEGATIVE NEGATIVE Final    Comment:        The GeneXpert MRSA Assay (FDA approved for NASAL specimens only), is one component of a comprehensive MRSA colonization surveillance program. It is not intended to diagnose MRSA infection nor to guide or monitor treatment for MRSA infections. Performed at [facility], [place] [address],  [place], [place] [address]      Terri Piedra, [place] [facility] [phone] Pager  01/20/2019  5:00 PM

## 2019-01-20 NOTE — Progress Notes (Signed)
Pt had presyncopal episode after transferring from bed to bedside commode just before 7pm shift change on 01/19/19. This RN came to pt's room with day shift RNs as NT called for help. Pt assisted back to bed. VS and CBG stable. Pt did not experience a fall during this admission. MD paged.

## 2019-01-21 ENCOUNTER — Inpatient Hospital Stay (HOSPITAL_COMMUNITY): Payer: Medicare Other

## 2019-01-21 ENCOUNTER — Encounter (HOSPITAL_COMMUNITY): Payer: Self-pay | Admitting: Radiology

## 2019-01-21 LAB — CBC
HCT: 29.8 % — ABNORMAL LOW (ref 36.0–46.0)
Hemoglobin: 9.4 g/dL — ABNORMAL LOW (ref 12.0–15.0)
MCH: 27 pg (ref 26.0–34.0)
MCHC: 31.5 g/dL (ref 30.0–36.0)
MCV: 85.6 fL (ref 80.0–100.0)
Platelets: 189 10*3/uL (ref 150–400)
RBC: 3.48 MIL/uL — ABNORMAL LOW (ref 3.87–5.11)
RDW: 15.5 % (ref 11.5–15.5)
WBC: 5.9 10*3/uL (ref 4.0–10.5)
nRBC: 0 % (ref 0.0–0.2)

## 2019-01-21 LAB — URINE CULTURE: Culture: <10000 — AB

## 2019-01-21 LAB — BASIC METABOLIC PANEL
Anion gap: 7 (ref 5–15)
BUN: 22 mg/dL (ref 8–23)
CO2: 21 mmol/L — ABNORMAL LOW (ref 22–32)
Calcium: 8.4 mg/dL — ABNORMAL LOW (ref 8.9–10.3)
Chloride: 112 mmol/L — ABNORMAL HIGH (ref 98–111)
Creatinine, Ser: 1.1 mg/dL — ABNORMAL HIGH (ref 0.44–1.00)
GFR calc Af Amer: 53 mL/min — ABNORMAL LOW (ref >60)
GFR calc non Af Amer: 46 mL/min — ABNORMAL LOW (ref >60)
Glucose, Bld: 125 mg/dL — ABNORMAL HIGH (ref 70–99)
Potassium: 3.6 mmol/L (ref 3.5–5.1)
Sodium: 140 mmol/L (ref 135–145)

## 2019-01-21 LAB — BLOOD GAS, ARTERIAL
Acid-base deficit: 0.5 mmol/L (ref 0.0–2.0)
Bicarbonate: 22.7 mmol/L (ref 20.0–28.0)
Drawn by: 237031
FIO2: 28
O2 Content: 2 L/min
O2 Saturation: 89.5 %
Patient temperature: 98.6
pCO2 arterial: 31.2 mmHg — ABNORMAL LOW (ref 32.0–48.0)
pH, Arterial: 7.476 — ABNORMAL HIGH (ref 7.350–7.450)
pO2, Arterial: 60.2 mmHg — ABNORMAL LOW (ref 83.0–108.0)

## 2019-01-21 LAB — GLUCOSE, CAPILLARY
Glucose-Capillary: 85 mg/dL (ref 70–99)
Glucose-Capillary: 87 mg/dL (ref 70–99)
Glucose-Capillary: 110 mg/dL — ABNORMAL HIGH (ref 70–99)

## 2019-01-21 LAB — SARS CORONAVIRUS 2 BY RT PCR (HOSPITAL ORDER, PERFORMED IN ~~LOC~~ HOSPITAL LAB): SARS Coronavirus 2: NEGATIVE

## 2019-01-21 MED ORDER — PREDNISONE 20 MG PO TABS
40.0000 mg | ORAL_TABLET | Freq: Two times a day (BID) | ORAL | Status: DC
  Administered 2019-01-21 – 2019-01-23 (×4): 40 mg via ORAL
  Filled 2019-01-21 (×4): qty 2

## 2019-01-21 MED ORDER — PREDNISONE 10 MG PO TABS: 10.0000 mg | ORAL_TABLET | Freq: Every day | ORAL | Status: DC

## 2019-01-21 MED ORDER — SULFAMETHOXAZOLE-TRIMETHOPRIM 400-80 MG/5ML IV SOLN
160.0000 mg | Freq: Two times a day (BID) | INTRAVENOUS | Status: DC
  Administered 2019-01-21 – 2019-01-23 (×4): 160 mg via INTRAVENOUS
  Filled 2019-01-21 (×6): qty 10

## 2019-01-21 MED ORDER — PREDNISONE 20 MG PO TABS: 20.0000 mg | ORAL_TABLET | Freq: Every day | ORAL | Status: DC

## 2019-01-21 MED ORDER — HYDROXYZINE HCL 10 MG PO TABS
10.0000 mg | ORAL_TABLET | Freq: Once | ORAL | Status: AC
  Administered 2019-01-21: 10 mg via ORAL
  Filled 2019-01-21: qty 1

## 2019-01-21 MED ORDER — IOHEXOL 350 MG/ML SOLN
50.0000 mL | Freq: Once | INTRAVENOUS | Status: AC | PRN
  Administered 2019-01-21: 50 mL via INTRAVENOUS

## 2019-01-21 MED ORDER — HEPARIN SODIUM (PORCINE) 5000 UNIT/ML IJ SOLN
5000.0000 [IU] | Freq: Three times a day (TID) | INTRAMUSCULAR | Status: DC
  Administered 2019-01-21 – 2019-01-24 (×9): 5000 [IU] via SUBCUTANEOUS
  Filled 2019-01-21 (×9): qty 1

## 2019-01-21 NOTE — Progress Notes (Signed)
SATURATION QUALIFICATIONS: (This note is used to comply with regulatory documentation for home oxygen)  Patient Saturations on Room Air at Rest = 82%  Patient Saturations on Room Air while Ambulating = **  Patient Saturations on ** Liters of oxygen while Ambulating =   Please briefly explain why patient needs home oxygen:  Patient walked 2 steps to the bedside commode OFF O2, Sats dropped to 75%. Place back on 2L nasal cannula, Sats 96%.

## 2019-01-21 NOTE — Progress Notes (Signed)
O2 at 3L now per RT,post ABG.

## 2019-01-21 NOTE — Progress Notes (Signed)
Pharmacy Antibiotic Note  Alison Ruiz is a 85 y.o. female admitted on 01/17/2019. Pharmacy has been consulted for bactrim dosing for PCP. Pt is afebrile and WBC is WNL.   Plan: Bactrim 5mg /kg IV BID F/u renal fxn, C&S, clinical status and ID recommendations  Height: 4\' 9"  (144.8 cm) Weight: 73 lb 3.2 oz (33.2 kg) IBW/kg (Calculated) : 38.6  Temp (24hrs), Avg:99.2 F (37.3 C), Min:98.6 F (37 C), Max:99.8 F (37.7 C)  Recent Labs  Lab 01/17/19 1913 01/18/19 0409 01/19/19 0722 01/20/19 0345 01/21/19 0246  WBC 4.1 4.6 4.8 5.8 5.9  CREATININE 1.03* 0.94 1.09* 1.20* 1.10*    Estimated Creatinine Clearance: 19.6 mL/min (A) (by C-G formula based on SCr of 1.1 mg/dL (H)).    No Known Allergies  Thank you for allowing pharmacy to be a part of this patient's care.  Leland Staszewski, Rande Lawman 01/21/2019 4:36 PM

## 2019-01-21 NOTE — Progress Notes (Signed)
Physical Therapy Treatment Patient Details Name: Alison Ruiz MRN: [id number] DOB: 07-10-1931 Today's Date: 01/21/2019    History of Present Illness Alison Ruiz is a 85 y.o. female presenting following a non-prodromal syncopal event likely multifactorial in etiology. PMH is significant for HIV not on chronic antiretrovirals, sinus arrhythmia, anxiety/depression, hyperlipidemia, resolved hepatitis B. Of note pt with another witness very near syncopal episode in which she became unresponsive while admitted on 01/19/19 and PT re-consulted.    PT Comments    Pt agreeable to therapy however is limited by oxygen desaturation (see General Comments), and orthostatic response to positional changes and related decreased strength and endurance. Pt mod I for bed mobility and min guard for transfers. Pt unable to ambulated due to oxygen desaturation and dizziness.  Orthostatic BPs                                                                                   SaO2 Supine 145/78 94% on 3L  Sitting 125/77 90% on 3L   Sitting after 3 min 119/79 85% on 3L  Standing 131/77 80% on 3L   supine 139/77 69% on 3L, 92%on 4L      Follow Up Recommendations  Home health PT;Supervision for mobility/OOB     Equipment Recommendations  None recommended by PT       Precautions / Restrictions Precautions Precautions: Fall Restrictions Weight Bearing Restrictions: No    Mobility  Bed Mobility Overal bed mobility: Modified Independent             General bed mobility comments: HOB elevated, use of bed rail  Transfers Overall transfer level: Needs assistance Equipment used: None Transfers: Sit to/from [facility] Sit to Stand: Min guard         General transfer comment: min guard for safety  Ambulation/Gait             General Gait Details: unable to attempt due to feeling lightheaded and O2 desat         Balance Overall balance assessment: Needs  assistance Sitting-balance support: Feet supported Sitting balance-Leahy Scale: Good     Standing balance support: No upper extremity supported Standing balance-Leahy Scale: Fair                              Cognition Arousal/Alertness: Awake/alert Behavior During Therapy: WFL for tasks assessed/performed Overall Cognitive Status: Within Functional Limits for tasks assessed                                           General Comments General comments (skin integrity, edema, etc.): On entry SaO2 on 2L O2 via [place] 87%O2, increased supplemental O2 to 3L and SaO2 increased to 91%O2, with mobility SaO2 dropped to 79%O2 increased to 4L O2 and with resting in supine and cues for pursed lipped breathing SaO2 rebounded to 90%O2. Left pt on 4L O2 and alerted RN      Pertinent Vitals/Pain Pain Assessment: No/denies pain  PT Goals (current goals can now be found in the care plan section) Acute Rehab PT Goals Patient Stated Goal: to go home PT Goal Formulation: With patient Time For Goal Achievement: 02/03/19 Potential to Achieve Goals: Good Progress towards PT goals: Not progressing toward goals - comment(limited by oxygen desaturation )    Frequency    Min 3X/week      PT Plan Discharge plan needs to be updated    Co-evaluation              AM-PAC PT "6 Clicks" Mobility   Outcome Measure  Help needed turning from your back to your side while in a flat bed without using bedrails?: None Help needed moving from lying on your back to sitting on the side of a flat bed without using bedrails?: None Help needed moving to and from a bed to a chair (including a wheelchair)?: None Help needed standing up from a chair using your arms (e.g., wheelchair or bedside chair)?: A Little Help needed to walk in hospital room?: Total Help needed climbing 3-5 steps with a railing? : Total 6 Click Score: 17    End of Session Equipment Utilized During  Treatment: Oxygen Activity Tolerance: Patient tolerated treatment well Patient left: in bed;with call bell/phone within reach;with bed alarm set Nurse Communication: Mobility status PT Visit Diagnosis: Other abnormalities of gait and mobility (R26.89)     Time: 1400-1435 PT Time Calculation (min) (ACUTE ONLY): 35 min  Charges:  $Therapeutic Activity: 8-22 mins                     Elinor Kleine B. Migdalia Dk PT, DPT Acute Rehabilitation Services Pager [phone] Office [phone]    [place] 01/21/2019, 3:01 PM

## 2019-01-21 NOTE — Progress Notes (Signed)
Spoke with Dr. Linus Salmons with infectious disease regarding the worsening respiratory status of Alison Ruiz.  After a discussion of her history and workup so far, Dr. Linus Salmons advised starting treatment for PCP with bactrim and a steroid taper.  Additionally, he agreed that it would be prudent to rule out COVID.  Pt is stable now. ID will see pt on 5/3.  Matilde Haymaker, MD

## 2019-01-21 NOTE — Progress Notes (Signed)
COVID -Negative. Dr. Pilar Plate made aware.

## 2019-01-21 NOTE — Progress Notes (Deleted)
Pt now experiencing increasing oxygen requirement which is being worked up by primary team.  May have infectious cause. Have paged ID three times. Waiting for response.  Matilde Haymaker, MD

## 2019-01-21 NOTE — Progress Notes (Signed)
O2Sats OFF O2- 82% at rest. Placed back on O2 @ 2L. 92%.

## 2019-01-21 NOTE — Progress Notes (Signed)
Family Medicine Teaching Service Daily Progress Note Intern Pager: [phone]  Patient name: Alison Ruiz Medical record number: [id number] Date of birth: 1930-11-28 Age: 85 y.o. Gender: female  Primary Care Provider: Patient, No Pcp Per Consultants: none Code Status: full  Pt Overview and Major Events to Date:  4/28- admitted  Assessment and Plan: Alison Ruiz is a 85 y.o. female presenting following a non-prodromal syncopal event likely multifactorial in etiology. PMH is significant for HIV NOT on chronic antiretrovirals, sinus arrhythmia, anxiety/depression, hyperlipidemia, resolved hepatitis B  Acute hypoxic respiratory failure Shows no oxygen requirement at baseline, prior to hospitalization.  During her hospitalization so far, she has intermittently been put on nasal cannula.  She was noted to have desaturations down to the 80s while working with physical therapy on 5/1.  On the morning of 5/2, she was taken off nasal cannula again and found to desaturate into the 80s again at rest.  This appears to be an increasing oxygen requirement for the past day.  Patient has remained afebrile in the past 24 hours and has been off of antibiotics for the past 48 hours.  Most recent chest x-ray on 4/30 showed bilateral pleural effusions.  At that time, primary team and infectious disease were not concerned about pneumonia and withdrew broad-spectrum antibiotics.  Admission labs remarkable for an albumin of 2.1.  Her HIV puts her at high risk for opportunistic infections, AFB sputum smear was discontinued due to insufficient sampling.  Echocardiogram this admission shows mildly reduced ejection fraction at 45 to 50%.  She is not fluid overloaded on exam.  Most likely cause of her worsening respiratory status at this time is the known pleural effusions. -Follow-up x-ray chest 2 view  Fever overnight on 4/29 Afebrile since the evening of 4/29.  The only concerning possible infectious symptom is loose  stools for the past 4 months.  Stool culture pending.  This is not a barrier for discharge.  She has remained stable for the past 2 days.  -Follow-up stool O&P -Follow-up stool culture -Infectious disease following, appreciate recommendations  Syncopal event, likely multifactorial: Acute, stable She had a near syncopal episode overnight.  It appears to be related to position changes from the bed to standing in order to move to the bedside commode.  This appears increasingly to have a large orthostatic hypotension component -No further work-up at this time    Sinus bradycardia w/ h/o arrhythmia: Stable.   No bradycardia noted overnight.  Hypoglycemia: Acute, now stable -CBGs before meals and nightly  HIV not on chronic antivirals: Chronic ID working to obtain a 30-day supply of antiretrovirals.  Due to years of poor medication compliance, the virus now has an impressive drug resistance profile. -Descovy daily -Tivicay daily -Prophylactic azithromycin daily -Prophylactic Bactrim daily  AKI vs CKD, worsening Baseline creatinine 0.7.  Creatinine 1.19 on admission.  Creatinine 1.1 on the morning of 5/2. - Monitor BMP -Avoid nephrotoxic medications as possible  Normocytic anemia: Chronic, stable.  Stable hemoglobin this morning at 9.6 (5/1) - Monitor CBC  Elevated alkaline phosphatase: Acute.- improved from 293 on admission to 265 today.  GGT within normal limits, indicating this is possibly a non-hepatic source. -No further investigation at this time.  Protein calorie malnutrition: Likely chronic. Recommendations made, nutrition supplementation ordered. - Nutrition following - Ensure TID  Anxiety  depression: Chronic, stable.  Well-managed, not on any controlling medication. Patient does not appear agitated or depressed mood this morning. - Monitor for symptoms  History of hepatitis B:  Stable. Remote infection cleared per hepatitis B serologies in 2013: Hep B core and  surface Ab positive, surface Ag negative.   FEN/GI: Soft diet,ensure Prophylaxis: Lovenox  Disposition:  Possible discharge home 5/2 or 5/3  Subjective:  No acute events overnight.  This morning, she continues to note generalized weakness.  She feels uncomfortable with the prospect of returning home to live alone.  She would feel safer there was someone checking on her occasionally.  She continues to note frequent bowel movements as many as 3 or 4/day.  Objective: Temp:  [98.6 F (37 C)-99.8 F (37.7 C)] 98.6 F (37 C) (05/02 0606) Pulse Rate:  [64-72] 64 (05/02 0606) Resp:  [16-18] 16 (05/02 0606) BP: (116-120)/(65-107) 116/65 (05/02 0606) SpO2:  [81 %-100 %] 93 % (05/02 0606)  General: Alert and cooperative and appears to be in no acute distress.  Sitting at the edge of her bed eating her breakfast. HEENT: No appreciable JVD. Cardio: Normal S1 and S2, no S3 or S4. Rhythm is regular. No murmurs or rubs.   Pulm: Clear to auscultation bilaterally, no crackles, wheezing, or diminished breath sounds. Normal respiratory effort Abdomen: Bowel sounds normal. Abdomen soft and non-tender.  Extremities: No peripheral edema. Warm/ well perfused.  Strong radial pulses. Neuro: Cranial nerves grossly intact   Laboratory: Recent Labs  Lab 01/19/19 0722 01/20/19 0345 01/21/19 0246  WBC 4.8 5.8 5.9  HGB 9.7* 9.6* 9.4*  HCT 31.2* 29.9* 29.8*  PLT 184 181 189   Recent Labs  Lab 01/17/19 1404  01/18/19 0409 01/19/19 0722 01/20/19 0345 01/21/19 0246  NA 138  --  140 140 142 140  K 4.1  --  4.0 3.9 3.7 3.6  CL 105  --  109 107 111 112*  CO2 20*  --  20* 22 22 21*  BUN 25*  --  23 20 20 22   CREATININE 1.19*   < > 0.94 1.09* 1.20* 1.10*  CALCIUM 9.0  --  8.6* 8.3* 8.6* 8.4*  PROT 6.6  --  5.6*  --   --   --   BILITOT 0.6  --  0.5  --   --   --   ALKPHOS 293*  --  265*  --   --   --   ALT 36  --  28  --   --   --   AST 58*  --  36  --   --   --   GLUCOSE 289*  --  80 92 94 125*    < > = values in this interval not displayed.    Imaging/Diagnostic Tests: No results found.   Matilde Haymaker, MD 01/21/2019, 6:42 AM PGY-1, [place] Intern pager: [phone], text pages welcome

## 2019-01-22 DIAGNOSIS — B20 Human immunodeficiency virus [HIV] disease: Secondary | ICD-10-CM

## 2019-01-22 DIAGNOSIS — Z9119 Patient's noncompliance with other medical treatment and regimen: Secondary | ICD-10-CM

## 2019-01-22 DIAGNOSIS — R0902 Hypoxemia: Secondary | ICD-10-CM

## 2019-01-22 LAB — GLUCOSE, CAPILLARY
Glucose-Capillary: 168 mg/dL — ABNORMAL HIGH (ref 70–99)
Glucose-Capillary: 320 mg/dL — ABNORMAL HIGH (ref 70–99)
Glucose-Capillary: 180 mg/dL — ABNORMAL HIGH (ref 70–99)
Glucose-Capillary: 160 mg/dL — ABNORMAL HIGH (ref 70–99)

## 2019-01-22 LAB — CBC
HCT: 31 % — ABNORMAL LOW (ref 36.0–46.0)
Hemoglobin: 9.6 g/dL — ABNORMAL LOW (ref 12.0–15.0)
MCH: 26.8 pg (ref 26.0–34.0)
MCHC: 31 g/dL (ref 30.0–36.0)
MCV: 86.6 fL (ref 80.0–100.0)
Platelets: 208 10*3/uL (ref 150–400)
RBC: 3.58 MIL/uL — ABNORMAL LOW (ref 3.87–5.11)
RDW: 15.6 % — ABNORMAL HIGH (ref 11.5–15.5)
WBC: 4.3 10*3/uL (ref 4.0–10.5)
nRBC: 0 % (ref 0.0–0.2)

## 2019-01-22 LAB — BASIC METABOLIC PANEL
Anion gap: 10 (ref 5–15)
BUN: 21 mg/dL (ref 8–23)
CO2: 23 mmol/L (ref 22–32)
Calcium: 8.8 mg/dL — ABNORMAL LOW (ref 8.9–10.3)
Chloride: 108 mmol/L (ref 98–111)
Creatinine, Ser: 1.07 mg/dL — ABNORMAL HIGH (ref 0.44–1.00)
GFR calc Af Amer: 55 mL/min — ABNORMAL LOW (ref >60)
GFR calc non Af Amer: 47 mL/min — ABNORMAL LOW (ref >60)
Glucose, Bld: 142 mg/dL — ABNORMAL HIGH (ref 70–99)
Potassium: 4.4 mmol/L (ref 3.5–5.1)
Sodium: 141 mmol/L (ref 135–145)

## 2019-01-22 MED ORDER — SODIUM CHLORIDE 0.9 % IV SOLN
INTRAVENOUS | Status: DC | PRN
  Administered 2019-01-22: 250 mL via INTRAVENOUS

## 2019-01-22 MED ORDER — FUROSEMIDE 10 MG/ML IJ SOLN
10.0000 mg | Freq: Once | INTRAMUSCULAR | Status: AC
  Administered 2019-01-22: 10 mg via INTRAVENOUS
  Filled 2019-01-22: qty 2

## 2019-01-22 MED ORDER — TRAZODONE HCL 50 MG PO TABS
50.0000 mg | ORAL_TABLET | Freq: Every evening | ORAL | Status: AC | PRN
  Administered 2019-01-22: 50 mg via ORAL
  Filled 2019-01-22: qty 1

## 2019-01-22 MED ORDER — INSULIN ASPART 100 UNIT/ML ~~LOC~~ SOLN
0.0000 [IU] | Freq: Three times a day (TID) | SUBCUTANEOUS | Status: DC
  Administered 2019-01-22: 7 [IU] via SUBCUTANEOUS
  Administered 2019-01-22 – 2019-01-23 (×2): 2 [IU] via SUBCUTANEOUS
  Administered 2019-01-23: 1 [IU] via SUBCUTANEOUS

## 2019-01-22 NOTE — Progress Notes (Signed)
Family Medicine Teaching Service Daily Progress Note Intern Pager: [phone]  Patient name: Alison Ruiz Medical record number: [id number] Date of birth: 1930/12/11 Age: 85 y.o. Gender: female  Primary Care Provider: Patient, No Pcp Per Consultants: none Code Status: full  Pt Overview and Major Events to Date:  4/28- admitted  Assessment and Plan: Alison Ruiz is a 85 y.o. female presenting following a non-prodromal syncopal event likely multifactorial in etiology. PMH is significant for HIV non adherent to chronic antiretrovirals, sinus arrhythmia, anxiety/depression, hyperlipidemia, resolved hepatitis B  Increased oxygen requirement, acute, stable Since hospitalization patient has required increased oxygen in the setting of recorded desaturation at rest and with activity. No history of oxygen requirement prior to admission. Patient went from 4L to2L [place] and maintaining saturation in the upper 90's. CT angio obtained on 5/2 showed bilateral infiltrates with L>R suspicious for infection vs pulmonary edema. Given HIV status with a CD4<35 concern for opportunistic infection such as PCP, AFB also pending for MAC. COVID was negative. Patient also had cardiomegaly and an echo with and EF of 45-50% concerning for possible volume overload given IVF received since admission. Currently on IV Bactrim  and steroids for possible PCP. Azithromycin has been d/ced on 4/30.   --Continue treatment IV Bactrim (start 5/2) Day 2. Prior on po prophylactic dose (Start 4/30) --Continue Prednisone 40 mg bid --F/u on Infection Diseases consult, appreciate recs --Wean Oxygen as tolerated --Monitor fluid status and intake --Could consider small dose lasix given possible pulm edema  HIV chronic, uncontrolled now on ART ID working to obtain a 30-day supply of antiretrovirals.  Poor medication adherence in the past two years. Will follow up with ID in the outpatient setting (Dr. Megan Salon).  -Descovy daily -Tivicay  daily --Continue treatment dose Bactrim (start 5/2) --Follow up on ID consult, appreciate recs  AKI, stable  Creatinine this morning 1.07 from 1.19 on admission. Initially thought to be prerenal, improved with IVF. Has now bumped likely in the setting of recent vancomycin and possible mild volume overload. Patient has also received contrast on 5/2 for CT angio.  --F/u on am CMP  --Considering lasix given possible pulm edema w/ new O2 req  Diarrhea, chronic Appears to be chronic (for the past two years). No abdominal pain reported. Given HIV status and CD4 count of 35, concern for colitis 2/2 MAC, Salmonella, Cryptosporidium, Cryptococcus possible lymphoma as well. --Follow up on O&P --Follow up on stool cultures --Consider C. Diff  --Patient may need GI study (colonoscopy with biopsy?) --Discuss with ID after final results from cultures  Syncopal event, likely multifactorial: Acute, stable One recorded near syncopal episode since admission. Appears to be related to genaralized weakness with possible hypotension given low BP at the time. Continue to be bradycardic with increase HR with ambulation.  --Assistance with ambulation --Orthostatic prn  Fever, resolved Likely secondary to restart ART for HIV. Possible infectious process given lung findings, new oxygen requirement. Patient also with loose stools prior to admission which has continue during hospitalization, concern for GI process. Has been afebrile now for >72 hours.  -Follow-up stool O&P -Follow-up stool culture -Infectious disease following, appreciate recommendations --Continue Bactrim    Sinus bradycardia w/ h/o arrhythmia: Stable.   HR 55 overnight. Continue to be bradycardic with appropriate increased in rate with activity. Will continue to monitor  Hypoglycemia, resolved -CBGs before meals and nightly  Normocytic anemia: Chronic, stable.  Stable hemoglobin this morning at 9.6 (5/3). - Monitor CBC  Elevated  alkaline phosphatase, improved, stable  GGT within normal limits, indicating this is possibly a non-hepatic source. -No further investigation at this time. --Outpatient follow up --F/u on am CMP  Protein calorie malnutrition, chronic. Poor po intake for the last two years. Recommendations made, nutrition supplementation ordered. - Nutrition following - Ensure TID  Anxiety  depression: Chronic, stable.  Well-managed, not on any controlling medication. Patient does not appear agitated or depressed mood this morning. - Monitor for symptoms  History of hepatitis B: Stable. Remote infection cleared per hepatitis B serologies in 2013: Hep B core and surface Ab positive, surface Ag negative.   FEN/GI: Soft diet,ensure Prophylaxis: Lovenox  Disposition:  Pending clinical improvement SNF vs HH with PT/OT  Subjective:  No acute events overnight. Still feeling weak but breathing has improved. She has good appetite but is still endorsing some diarrhea. Discuss SNF vs HHPT. Patient is favoring home health PT.  Objective: Temp:  [97.7 F (36.5 C)-99.7 F (37.6 C)] 97.7 F (36.5 C) (05/03 0700) Pulse Rate:  [55-67] 55 (05/03 0700) Resp:  [14-20] 18 (05/03 0700) BP: (93-122)/(56-69) 111/69 (05/03 0700) SpO2:  [82 %-98 %] 98 % (05/03 0700) Weight:  [33.1 kg] 33.1 kg (05/03 0317)  General: Alert and cooperative and appears to be in no acute distress.  Sitting at the edge of her bed eating her breakfast. HEENT: No appreciable JVD. Cardio: Normal S1 and S2, no S3 or S4. Rhythm is regular. No murmurs or rubs.   Pulm: Clear to auscultation bilaterally, no crackles, wheezing, or diminished breath sounds. Normal respiratory effort Abdomen: Bowel sounds normal. Abdomen soft and non-tender.  Extremities: No peripheral edema. Warm/ well perfused.  Strong radial pulses. Neuro: Cranial nerves grossly intact   Laboratory: Recent Labs  Lab 01/20/19 0345 01/21/19 0246 01/22/19 0600  WBC 5.8  5.9 4.3  HGB 9.6* 9.4* 9.6*  HCT 29.9* 29.8* 31.0*  PLT 181 189 208   Recent Labs  Lab 01/17/19 1404  01/18/19 0409  01/20/19 0345 01/21/19 0246 01/22/19 0600  NA 138  --  140   < > 142 140 141  K 4.1  --  4.0   < > 3.7 3.6 4.4  CL 105  --  109   < > 111 112* 108  CO2 20*  --  20*   < > 22 21* 23  BUN 25*  --  23   < > _0 CREATININE 1.19*   < > 0.94   < > 1.20* 1.10* 1.07*  CALCIUM 9.0  --  8.6*   < > 8.6* 8.4* 8.8*  PROT 6.6  --  5.6*  --   --   --   --   BILITOT 0.6  --  0.5  --   --   --   --   ALKPHOS 293*  --  265*  --   --   --   --   ALT 36  --  28  --   --   --   --   AST 58*  --  36  --   --   --   --   GLUCOSE 289*  --  80   < > 94 125* 142*   < > = values in this interval not displayed.    Imaging/Diagnostic Tests: Dg Chest 2 View  Result Date: 01/21/2019 CLINICAL DATA:  Syncopal episode.  Oxygen desaturation. EXAM: CHEST - 2 VIEW COMPARISON:  01/19/2019 FINDINGS: Chronic cardiomegaly. Atherosclerotic calcification. There is generalized interstitial coarsening  with fine appearance diffusely and more coarse opacities at the bases. No effusion or pneumothorax. IMPRESSION: 1. Stable coarsened lung markings of indeterminate chronicity. If acute this may reflect atypical infection. 2. Cardiomegaly. Electronically Signed   By: Monte Fantasia M.D.   On: 01/21/2019 11:52   Ct Angio Chest Pe W Or Wo Contrast  Result Date: 01/21/2019 CLINICAL DATA:  Suspected pulmonary embolism, negative D-dimer EXAM: CT ANGIOGRAPHY CHEST WITH CONTRAST TECHNIQUE: Multidetector CT imaging of the chest was performed using the standard protocol during bolus administration of intravenous contrast. Multiplanar CT image reconstructions and MIPs were obtained to evaluate the vascular anatomy. CONTRAST:  21m OMNIPAQUE IOHEXOL 350 MG/ML SOLN IV COMPARISON:  None FINDINGS: Cardiovascular: Atherosclerotic calcifications of aorta, proximal great vessels and coronary arteries. Aorta normal caliber  without aneurysm or dissection. Pulmonary arteries well opacified and patent. No evidence of pulmonary embolism. No pericardial effusion. Cardiac chambers appear enlarged. Mediastinum/Nodes: Esophagus unremarkable. Base of cervical region normal appearance. No thoracic adenopathy. Lungs/Pleura: Consolidation LEFT lower lobe. Additional diffuse BILATERAL alveolar infiltrates throughout both lungs which could represent pneumonia or edema. Minimal bibasilar atelectasis. No significant pleural effusion or pneumothorax. Upper Abdomen: 2 calculi at upper pole of LEFT kidney, larger 10 mm diameter. Musculoskeletal: No osseous abnormalities Review of the MIP images confirms the above findings. IMPRESSION: No evidence of pulmonary embolism. Scattered atherosclerotic calcifications including coronary arteries. Diffuse BILATERAL pulmonary infiltrates greatest in LEFT lower lobe, could represent pneumonia or pulmonary edema. Aortic Atherosclerosis (ICD10-I70.0). Electronically Signed   By: MLavonia DanaM.D.   On: 01/21/2019 15:55     DMarjie Skiff MD 01/22/2019, 7:32 AM PGY-3, [place] pager: [phone] text pages welcome

## 2019-01-22 NOTE — Progress Notes (Signed)
Patient's Blood sugar is 320, there are not any orders for coverage in computer  Paged MD

## 2019-01-22 NOTE — Progress Notes (Signed)
[facility] for Infectious Disease   Reason for visit: Follow up on HIV, hypoxia  Interval History: became hypoxic yesterday; CTA with pulmonary edema vs opacity.  Afebrile.  Started empirically on PJP treatment.  ABG with notable hypoxia.  CTA independently reviewed and most c/w edema  Physical Exam: Constitutional:  Vitals:   01/22/19 0907 01/22/19 1135  BP: 110/67 121/74  Pulse: 62 (!) 53  Resp:  18  Temp:  98.1 F (36.7 C)  SpO2: 95% 96%   patient appears in NAD Eyes: anicteric Respiratory: Normal respiratory effort; CTA B Cardiovascular: RRR GI: soft, nt, nd  Review of Systems: Constitutional: negative for fevers and chills Respiratory: negative for cough or dyspnea on exertion Integument/breast: negative for rash  Lab Results  Component Value Date   WBC 4.3 01/22/2019   HGB 9.6 (L) 01/22/2019   HCT 31.0 (L) 01/22/2019   MCV 86.6 01/22/2019   PLT 208 01/22/2019    Lab Results  Component Value Date   CREATININE 1.07 (H) 01/22/2019   BUN 21 01/22/2019   NA 141 01/22/2019   K 4.4 01/22/2019   CL 108 01/22/2019   CO2 23 01/22/2019    Lab Results  Component Value Date   ALT 28 01/18/2019   AST 36 01/18/2019   ALKPHOS 265 (H) 01/18/2019     Microbiology: Recent Results (from the past 240 hour(s))  Acid Fast Smear (AFB)     Status: None   Collection Time: 01/18/19  3:30 PM  Result Value Ref Range Status   AFB Specimen Processing Concentration  Final    Comment: (NOTE) Performed At: [facility] [place], [place] [id number] Rush Farmer MD SJ:[id number]    Acid Fast Smear NOSMR  Final    Comment: (NOTE) The acid-fast bacilli (AFB) smear will not be performed due to the specimen source (blood) or submission of an insufficient quantity of specimen.    Source (AFB) BLOOD  Final    Comment: ARTERIAL BLOOD Performed at [facility], [place] [address], [place], [place] [address]   Culture, blood (routine x 2)      Status: None (Preliminary result)   Collection Time: 01/18/19 10:44 PM  Result Value Ref Range Status   Specimen Description BLOOD LEFT HAND  Final   Special Requests   Final    BOTTLES DRAWN AEROBIC ONLY Blood Culture adequate volume   Culture   Final    NO GROWTH 4 DAYS Performed at [facility], [place] [address] [place], [place] [address]    Report Status PENDING  Incomplete  Culture, blood (routine x 2)     Status: None (Preliminary result)   Collection Time: 01/18/19 10:45 PM  Result Value Ref Range Status   Specimen Description BLOOD RIGHT HAND  Final   Special Requests   Final    BOTTLES DRAWN AEROBIC AND ANAEROBIC Blood Culture adequate volume   Culture   Final    NO GROWTH 4 DAYS Performed at [facility], [place] [address] [place], [place] [address]    Report Status PENDING  Incomplete  Culture, Urine     Status: Abnormal   Collection Time: 01/19/19 12:38 AM  Result Value Ref Range Status   Specimen Description URINE, RANDOM  Final   Special Requests NONE  Final   Culture (A)  Final    <10,000 COLONIES/mL INSIGNIFICANT GROWTH Performed at [facility] [address], [place], [place] [address]  Report Status 01/21/2019 FINAL  Final  MRSA PCR Screening     Status: None   Collection Time: 01/19/19 10:46 AM  Result Value Ref Range Status   MRSA by PCR NEGATIVE NEGATIVE Final    Comment:        The GeneXpert MRSA Assay (FDA approved for NASAL specimens only), is one component of a comprehensive MRSA colonization surveillance program. It is not intended to diagnose MRSA infection nor to guide or monitor treatment for MRSA infections. Performed at [facility], [place] [address], [place], [place] [address]   Stool culture (children & immunocomp patients)     Status: None (Preliminary result)   Collection Time: 01/20/19  6:18 PM  Result Value Ref Range Status   Salmonella/Shigella Screen PENDING  Incomplete   Campylobacter Culture PENDING   Incomplete   E coli, Shiga toxin Assay Negative Negative Final    Comment: (NOTE) Performed At: [facility] [address] [place], [place] [id number] Rush Farmer MD QI:[id number]   SARS Coronavirus 2 [facility] order, Performed in [place] hospital lab)     Status: None   Collection Time: 01/21/19  4:12 PM  Result Value Ref Range Status   SARS Coronavirus 2 NEGATIVE NEGATIVE Final    Comment: (NOTE) If result is NEGATIVE SARS-CoV-2 target nucleic acids are NOT DETECTED. The SARS-CoV-2 RNA is generally detectable in upper and lower  respiratory specimens during the acute phase of infection. The lowest  concentration of SARS-CoV-2 viral copies this assay can detect is 250  copies / mL. A negative result does not preclude SARS-CoV-2 infection  and should not be used as the sole basis for treatment or other  patient management decisions.  A negative result may occur with  improper specimen collection / handling, submission of specimen other  than nasopharyngeal swab, presence of viral mutation(s) within the  areas targeted by this assay, and inadequate number of viral copies  (<250 copies / mL). A negative result must be combined with clinical  observations, patient history, and epidemiological information. If result is POSITIVE SARS-CoV-2 target nucleic acids are DETECTED. The SARS-CoV-2 RNA is generally detectable in upper and lower  respiratory specimens dur ing the acute phase of infection.  Positive  results are indicative of active infection with SARS-CoV-2.  Clinical  correlation with patient history and other diagnostic information is  necessary to determine patient infection status.  Positive results do  not rule out bacterial infection or co-infection with other viruses. If result is PRESUMPTIVE POSTIVE SARS-CoV-2 nucleic acids MAY BE PRESENT.   A presumptive positive result was obtained on the submitted specimen  and confirmed on repeat testing.  While 2019  novel coronavirus  (SARS-CoV-2) nucleic acids may be present in the submitted sample  additional confirmatory testing may be necessary for epidemiological  and / or clinical management purposes  to differentiate between  SARS-CoV-2 and other Sarbecovirus currently known to infect humans.  If clinically indicated additional testing with an alternate test  methodology [phone]) is advised. The SARS-CoV-2 RNA is generally  detectable in upper and lower respiratory sp ecimens during the acute  phase of infection. The expected result is Negative. Fact Sheet for Patients:  StrictlyIdeas.no Fact Sheet for Healthcare Providers: BankingDealers.co.za This test is not yet approved or cleared by the [place] FDA and has been authorized for detection and/or diagnosis of SARS-CoV-2 by FDA under an Emergency Use Authorization (EUA).  This EUA will remain in effect (meaning this test can be used)  for the duration of the COVID-19 declaration under Section 564(b)(1) of the Act, 21 U.S.C. section 360bbb-3(b)(1), unless the authorization is terminated or revoked sooner. Performed at [facility], [place] [address], [place], [place] [address]     Impression/Plan:  1. Hypoxia - New, symptomatic finding since last seen.  With CT findings, Alison Ruiz most suspect pulmonary edema.  Alison Ruiz would recommend lasix and if Alison Ruiz improves after diuresis (less hypoxia), then I do not feel continued treatment for PJP pneumonia indicated.    2. HIV - poor compliance.  CD4 < 35, viral load 92,000.  On Tivicay, Descovy, Prezcobix (Symtuza + Tivicay).  Will continue.

## 2019-01-23 DIAGNOSIS — Z21 Asymptomatic human immunodeficiency virus [HIV] infection status: Secondary | ICD-10-CM

## 2019-01-23 DIAGNOSIS — Z79899 Other long term (current) drug therapy: Secondary | ICD-10-CM

## 2019-01-23 LAB — DIFFERENTIAL
Abs Immature Granulocytes: 0.04 10*3/uL (ref 0.00–0.07)
Basophils Absolute: 0 10*3/uL (ref 0.0–0.1)
Basophils Relative: 0 %
Eosinophils Absolute: 0 10*3/uL (ref 0.0–0.5)
Eosinophils Relative: 0 %
Immature Granulocytes: 1 %
Lymphocytes Relative: 5 %
Lymphs Abs: 0.4 10*3/uL — ABNORMAL LOW (ref 0.7–4.0)
Monocytes Absolute: 0.3 10*3/uL (ref 0.1–1.0)
Monocytes Relative: 4 %
Neutro Abs: 7.4 10*3/uL (ref 1.7–7.7)
Neutrophils Relative %: 90 %

## 2019-01-23 LAB — CBC
HCT: 30.9 % — ABNORMAL LOW (ref 36.0–46.0)
HCT: 30.8 % — ABNORMAL LOW (ref 36.0–46.0)
Hemoglobin: 9.7 g/dL — ABNORMAL LOW (ref 12.0–15.0)
Hemoglobin: 9.9 g/dL — ABNORMAL LOW (ref 12.0–15.0)
MCH: 26.8 pg (ref 26.0–34.0)
MCH: 28.3 pg (ref 26.0–34.0)
MCHC: 31.4 g/dL (ref 30.0–36.0)
MCHC: 32.1 g/dL (ref 30.0–36.0)
MCV: 85.4 fL (ref 80.0–100.0)
MCV: 88 fL (ref 80.0–100.0)
Platelets: 223 10*3/uL (ref 150–400)
Platelets: 239 10*3/uL (ref 150–400)
RBC: 3.62 MIL/uL — ABNORMAL LOW (ref 3.87–5.11)
RBC: 3.5 MIL/uL — ABNORMAL LOW (ref 3.87–5.11)
RDW: 15.4 % (ref 11.5–15.5)
RDW: 15.4 % (ref 11.5–15.5)
WBC: 8.8 10*3/uL (ref 4.0–10.5)
WBC: 8.2 10*3/uL (ref 4.0–10.5)
nRBC: 0 % (ref 0.0–0.2)
nRBC: 0 % (ref 0.0–0.2)

## 2019-01-23 LAB — BASIC METABOLIC PANEL
Anion gap: 11 (ref 5–15)
BUN: 33 mg/dL — ABNORMAL HIGH (ref 8–23)
CO2: 22 mmol/L (ref 22–32)
Calcium: 9.4 mg/dL (ref 8.9–10.3)
Chloride: 107 mmol/L (ref 98–111)
Creatinine, Ser: 1.38 mg/dL — ABNORMAL HIGH (ref 0.44–1.00)
GFR calc Af Amer: 40 mL/min — ABNORMAL LOW (ref >60)
GFR calc non Af Amer: 35 mL/min — ABNORMAL LOW (ref >60)
Glucose, Bld: 135 mg/dL — ABNORMAL HIGH (ref 70–99)
Potassium: 4.2 mmol/L (ref 3.5–5.1)
Sodium: 140 mmol/L (ref 135–145)

## 2019-01-23 LAB — CULTURE, BLOOD (ROUTINE X 2)
Culture: NO GROWTH
Culture: NO GROWTH
Special Requests: ADEQUATE
Special Requests: ADEQUATE

## 2019-01-23 LAB — GLUCOSE, CAPILLARY
Glucose-Capillary: 127 mg/dL — ABNORMAL HIGH (ref 70–99)
Glucose-Capillary: 158 mg/dL — ABNORMAL HIGH (ref 70–99)
Glucose-Capillary: 105 mg/dL — ABNORMAL HIGH (ref 70–99)
Glucose-Capillary: 115 mg/dL — ABNORMAL HIGH (ref 70–99)

## 2019-01-23 MED ORDER — SULFAMETHOXAZOLE-TRIMETHOPRIM 800-160 MG PO TABS
1.0000 | ORAL_TABLET | Freq: Every day | ORAL | Status: DC
  Administered 2019-01-23 – 2019-01-24 (×2): 1 via ORAL
  Filled 2019-01-23 (×3): qty 1

## 2019-01-23 MED ORDER — PREDNISONE 20 MG PO TABS
20.0000 mg | ORAL_TABLET | Freq: Every day | ORAL | Status: DC
  Administered 2019-01-24: 09:00:00 20 mg via ORAL
  Filled 2019-01-23: qty 1

## 2019-01-23 MED ORDER — PSYLLIUM 95 % PO PACK
1.0000 | PACK | Freq: Every day | ORAL | Status: DC
  Administered 2019-01-23 – 2019-01-24 (×2): 1 via ORAL
  Filled 2019-01-23 (×2): qty 1

## 2019-01-23 NOTE — Progress Notes (Addendum)
Noted consult for home oxygen; need qualifying oxygen saturation to determine if patient needs home oxygen per Medicare/ insurance guidelines within 24 hrs of discharge home. Mindi Slicker [facility] [phone]

## 2019-01-23 NOTE — Progress Notes (Addendum)
Family Medicine Teaching Service Daily Progress Note Intern Pager: [phone]  Patient name: Alison Ruiz Medical record number: [id number] Date of birth: 22-Sep-1930 Age: 85 y.o. Gender: female  Primary Care Provider: Patient, No Pcp Per Consultants: CM, Dietitian Code Status: Full Code   Pt Overview and Major Events to Date:  Hospital Day: 7 01/17/2019: admitted for Loss of Consciousness 01/18/2019: Febrile to 102.1, started on zosyn (4/29-4/30), azithromycin (4/29-2/30), Bactrim DS (4/29), Bactrim (5/2- )  01/19/2019: presyncopal event. CXR w/ possible edema?  01/20/2019: Desat to 80's with PT  01/21/2019: CXR: possible atypical infection? started Bactrim + steroid taper for possible PCP, ABG pH 7.476, PCOw 31.2, pO2 60.2)  Assessment and Plan: Alison Ruiz is a 85 y.o. female who presented w/ prodromal syncopal event likely multifactorial in etiology. PMHx is significant for HIV (nonadherent to chronic antiretroviral), sinus arrhythmia, anxiety/depression, hyperlipidemia, resolved Hep B.    #New oxygen requirement, stable On 4/30, desatted to 86 on RA and has since required 2-4L of O2 via [place] to maintain sats. COVID negative (5/2), remained on 2L all day 5/3 with O2 ranges from 93-98%.  Unclear etiology.  CT angios obtained on 5 2 with bilateral infiltrates concerning for infection versus pulmonary edema.  Chest x-ray on 5 3 showing coarseness concerning for possible atypical infection.  His patient's CD4 count is<35, concern for PCP.  AFB is still pending.  For possible pulmonary edema, patient provided 10 mg IV Lasix yesterday with 500 cc urine output charted.  Today,lungs are clear on exam and pt continues to appear euvolemic.  White count increased to 8.8 from 4.3 yesterday likely due to prednisone. Respiratory findings could be due to restarting ART, and can expect hypoxia, dyspnea, and possible pulmonary opacification. Treatment for this is prednisone.   Wean oxygen as tolerated  Strict  I's and O's  Continue PT/OT   Follow-up Dif   Home DME oxygen   Continue prednisone 20mg  daily   # Diarrhea, chronic  Pending Ova + Parasite exam (collected 5/1).  Shigella negative. Campylobacter pending. Possible dumping syndrome as she has diarrhea 30 minutes after meals. Can consider colonoscopy for visualization and tissue biopsy.   Continue to monitor  Start metamucil  Ask ID about further recommendations   Follow up o/p with GI and/or ID   #HIV, CD 4 <35 Was not on any ART therapy prior to admission.  ID is currently on board and working to obtain 30-day supply of antiretrovirals.  Patient follows with Dr. Megan Salon outpatient.  Continue Descovy, Prezcobix, tivicay once daily   Switch to prophylactic dose on 5/2 IV Bactrim  D/C IV bactrim per ID, signing off  Follow up o/p with ID   # AKI Creatinine 1.38 today, increased from 1.07 yesterday. Prior to admission, most recent Cr from 2018 showing normal renal function. Unclear if this is new baseline for her?  Acute increase since yesterday likely due to Lasix  Continue to monitor  # Syncope, acute Likely due to generalized weakness, hypotension and hypoglycemia.  Additionally, patient has status bradycardia at baseline.  Patient has appropriate increase in heart rate with ambulation.  Orthostatic vitals as needed  Assistance with ambulation  Continue PT OT  # Fever, resolved  Afebrile.  Blood cultures NGTD x 5 days. Urine culture <10k colonies of insignificant.  growth. Acid fast smear normal.  Stool ova and parasite exam pending.  White count increased to 8.8 from 4.3 yesterday  Monitor for signs  If fever repeat blood culture  # Sinus  Bradycardia, stable  HR ranging from 55-63 in last 24 hours.    Continue to monitor vital signs  # Hypoglycemia, resolved 127 - 320. Hypoglycemic ot 44 on 5/1. Likely to be elevated in setting of steroids  CBG is 4 times daily, ac, qhs  Sensitive sliding scale NovoLog  at mealtime  # Normocytic anemia Hemoglobin stable at 9.7   Monitor on CBC   #Anxiety/Depression  Not on any meds controller medications  Continue to monitor  #FEN/GI:   Fluids: none  . Nutrition: DYS 2, Low Na Heart Healthy    Access: Right PIV (01/22/19)  VTE prophylaxis: Heparin 5000 Q8H   Disposition: Pending PT/OT recommendations & medical improvement .  ============================================================== Subjective:  NAEO.   Objective: Temp:  [97.7 F (36.5 C)-98.1 F (36.7 C)] 98.1 F (36.7 C) (05/04 0514) Pulse Rate:  [53-63] 63 (05/04 0514) Resp:  [16-18] 16 (05/04 0514) BP: (110-133)/(67-74) 133/70 (05/04 0514) SpO2:  [93 %-98 %] 93 % (05/04 0514) Weight:  [33.3 kg] 33.3 kg (05/04 0514) 05/03 0701 - 05/04 0700 In: 1590.6 [P.O.:600; I.V.:10.4; IV Piggyback:980.2] Out: 500 [Urine:500]   Physical Exam: General: NAD, non-toxic, well-appearing, sitting comfortably in chair working with PT this morning. On 2 L. Mild SOB with leg raises. .  Cardiovascular: RRR, normal S1, S2. 2+ RP bilaterally. No BLEE>  Respiratory: CTAB.  Abdomen: + BS. NT, ND, soft to palpation.  Extremities: Warm and well perfused. Moving spontaneously.   Laboratory: I have personally read and reviewed all labs and imaging studies.  CBC: Recent Labs  Lab 01/17/19 1404  01/18/19 0409 01/19/19 0722 01/20/19 0345 01/21/19 0246 01/22/19 0600  WBC 4.0   < > 4.6 4.8 5.8 5.9 4.3  NEUTROABS 3.3  --   --  3.6  --   --   --   HGB 11.2*   < > 10.3* 9.7* 9.6* 9.4* 9.6*  HCT 36.7   < > 33.1* 31.2* 29.9* 29.8* 31.0*  MCV 89.7   < > 86.2 86.2 85.2 85.6 86.6  PLT 178   < > 190 184 181 189 208   < > = values in this interval not displayed.   CMP: Recent Labs  Lab 01/17/19 1404  01/18/19 0409 01/19/19 0722 01/20/19 0345 01/21/19 0246 01/22/19 0600  NA 138  --  140 140 142 140 141  K 4.1  --  4.0 3.9 3.7 3.6 4.4  CL 105  --  109 107 111 112* 108  CO2 20*  --  20* 22 22 21* 23   GLUCOSE 289*  --  80 92 94 125* 142*  BUN 25*  --  23 20 20 22 21   CREATININE 1.19*   < > 0.94 1.09* 1.20* 1.10* 1.07*  CALCIUM 9.0  --  8.6* 8.3* 8.6* 8.4* 8.8*  ALBUMIN 2.4*  --  2.1*  --   --   --   --    < > = values in this interval not displayed.     GFR: Estimated Creatinine Clearance: 20.2 mL/min (A) (by C-G formula based on SCr of 1.07 mg/dL (H)).  CBG: Recent Labs  Lab 01/21/19 2129 01/22/19 0801 01/22/19 1102 01/22/19 1600 01/22/19 2208  GLUCAP 110* 168* 320* 180* 160*    Imaging/Diagnostic Tests: Dg Chest 2 View Result Date: 01/21/2019 MPRESSION: 1. Stable coarsened lung markings of indeterminate chronicity. If acute this may reflect atypical infection. 2. Cardiomegaly.   Ct Angio Chest Pe W Or Wo Contrast Result Date: 01/21/2019  IMPRESSION: No evidence of pulmonary embolism. Scattered atherosclerotic calcifications including coronary arteries. Diffuse BILATERAL pulmonary infiltrates greatest in LEFT lower lobe, could represent pneumonia or pulmonary edema. Aortic Atherosclerosis (ICD10-I70.0).     Wilber Oliphant, MD 01/23/2019, 6:27 AM PGY-1, [place] Intern pager: [phone], text pages welcome

## 2019-01-23 NOTE — Plan of Care (Signed)

## 2019-01-23 NOTE — Progress Notes (Signed)
SATURATION QUALIFICATIONS: (This note is used to comply with regulatory documentation for home oxygen)  Patient Saturations on Room Air at Rest = 84%  Patient Saturations on Room Air while Ambulating = 78 %  Patient Saturations on 2 Liters of oxygen while Ambulating = 86%  Please briefly explain why patient needs home oxygen: Patient becomes very short of breath when ambulating.   Patient is also a mouth breather.   Patient will also become short of breath when talking on the phone while wearing her 02.

## 2019-01-23 NOTE — Progress Notes (Signed)
[facility] for Infectious Disease   Reason for visit: Follow up on HIV, hypoxia  Interval History: less hypoxia, 96% on room air; no fever, normal wbc.  No associated rash, diarrhea.   Physical Exam: Constitutional:  Vitals:   01/23/19 0837 01/23/19 1000  BP: 111/73   Pulse: 67   Resp:    Temp:    SpO2: 96% 94%   patient appears in NAD Eyes: anicteric Respiratory: Normal respiratory effort; CTA B Cardiovascular: RRR GI: soft, nt, nd  Review of Systems: Constitutional: negative for fevers and chills Respiratory: negative for cough or dyspnea on exertion Integument/breast: negative for rash  Lab Results  Component Value Date   WBC 8.8 01/23/2019   HGB 9.7 (L) 01/23/2019   HCT 30.9 (L) 01/23/2019   MCV 85.4 01/23/2019   PLT 223 01/23/2019    Lab Results  Component Value Date   CREATININE 1.38 (H) 01/23/2019   BUN 33 (H) 01/23/2019   NA 140 01/23/2019   K 4.2 01/23/2019   CL 107 01/23/2019   CO2 22 01/23/2019    Lab Results  Component Value Date   ALT 28 01/18/2019   AST 36 01/18/2019   ALKPHOS 265 (H) 01/18/2019     Microbiology: Recent Results (from the past 240 hour(s))  Acid Fast Smear (AFB)     Status: None   Collection Time: 01/18/19  3:30 PM  Result Value Ref Range Status   AFB Specimen Processing Concentration  Final    Comment: (NOTE) Performed At: [facility] [place], [place] [id number] Rush Farmer MD ZT:[id number]    Acid Fast Smear NOSMR  Final    Comment: (NOTE) The acid-fast bacilli (AFB) smear will not be performed due to the specimen source (blood) or submission of an insufficient quantity of specimen.    Source (AFB) BLOOD  Final    Comment: ARTERIAL BLOOD Performed at [facility], [place] [address] [place], [place] [address]   Culture, blood (routine x 2)     Status: None   Collection Time: 01/18/19 10:44 PM  Result Value Ref Range Status   Specimen Description BLOOD LEFT HAND  Final   Special Requests   Final    BOTTLES DRAWN AEROBIC ONLY Blood Culture adequate volume   Culture   Final    NO GROWTH 5 DAYS Performed at [facility], [place] [address], [place], [place] [address]    Report Status 01/23/2019 FINAL  Final  Culture, blood (routine x 2)     Status: None   Collection Time: 01/18/19 10:45 PM  Result Value Ref Range Status   Specimen Description BLOOD RIGHT HAND  Final   Special Requests   Final    BOTTLES DRAWN AEROBIC AND ANAEROBIC Blood Culture adequate volume   Culture   Final    NO GROWTH 5 DAYS Performed at [facility], [place] [address] [place], [place] [address]    Report Status 01/23/2019 FINAL  Final  Culture, Urine     Status: Abnormal   Collection Time: 01/19/19 12:38 AM  Result Value Ref Range Status   Specimen Description URINE, RANDOM  Final   Special Requests NONE  Final   Culture (A)  Final    <10,000 COLONIES/mL INSIGNIFICANT GROWTH Performed at [place] [address], [place], [place] [address]    Report Status 01/21/2019 FINAL  Final  MRSA PCR Screening     Status: None   Collection  Time: 01/19/19 10:46 AM  Result Value Ref Range Status   MRSA by PCR NEGATIVE NEGATIVE Final    Comment:        The GeneXpert MRSA Assay (FDA approved for NASAL specimens only), is one component of a comprehensive MRSA colonization surveillance program. It is not intended to diagnose MRSA infection nor to guide or monitor treatment for MRSA infections. Performed at [facility], [place] [address], [place], [place] [address]   Stool culture (children & immunocomp patients)     Status: None (Preliminary result)   Collection Time: 01/20/19  6:18 PM  Result Value Ref Range Status   Salmonella/Shigella Screen Final report  Final   Campylobacter Culture PENDING  Incomplete   E coli, Shiga toxin Assay Negative Negative Final    Comment: (NOTE) Performed At: [facility] [place], [place] [id number]  Rush Farmer MD MV:[id number]   STOOL CULTURE REFLEX - RSASHR     Status: None   Collection Time: 01/20/19  6:18 PM  Result Value Ref Range Status   Stool Culture result 1 (RSASHR) Comment  Final    Comment: (NOTE) No Salmonella or Shigella recovered. Performed At: [facility] [address] [place], [place] [id number] Rush Farmer MD ML:[id number]   [place] [facility] order, Performed in [facility] hospital lab)     Status: None   Collection Time: 01/21/19  4:12 PM  Result Value Ref Range Status   SARS Coronavirus 2 NEGATIVE NEGATIVE Final    Comment: (NOTE) If result is NEGATIVE SARS-CoV-2 target nucleic acids are NOT DETECTED. The SARS-CoV-2 RNA is generally detectable in upper and lower  respiratory specimens during the acute phase of infection. The lowest  concentration of SARS-CoV-2 viral copies this assay can detect is 250  copies / mL. A negative result does not preclude SARS-CoV-2 infection  and should not be used as the sole basis for treatment or other  patient management decisions.  A negative result may occur with  improper specimen collection / handling, submission of specimen other  than nasopharyngeal swab, presence of viral mutation(s) within the  areas targeted by this assay, and inadequate number of viral copies  (<250 copies / mL). A negative result must be combined with clinical  observations, patient history, and epidemiological information. If result is POSITIVE SARS-CoV-2 target nucleic acids are DETECTED. The SARS-CoV-2 RNA is generally detectable in upper and lower  respiratory specimens dur ing the acute phase of infection.  Positive  results are indicative of active infection with SARS-CoV-2.  Clinical  correlation with patient history and other diagnostic information is  necessary to determine patient infection status.  Positive results do  not rule out bacterial infection or co-infection with other viruses. If result is  PRESUMPTIVE POSTIVE SARS-CoV-2 nucleic acids MAY BE PRESENT.   A presumptive positive result was obtained on the submitted specimen  and confirmed on repeat testing.  While 2019 novel coronavirus  (SARS-CoV-2) nucleic acids may be present in the submitted sample  additional confirmatory testing may be necessary for epidemiological  and / or clinical management purposes  to differentiate between  SARS-CoV-2 and other Sarbecovirus currently known to infect humans.  If clinically indicated additional testing with an alternate test  methodology [phone]) is advised. The SARS-CoV-2 RNA is generally  detectable in upper and lower respiratory sp ecimens during the acute  phase of infection. The expected result is Negative. Fact Sheet for Patients:  StrictlyIdeas.no Fact Sheet for Healthcare Providers: BankingDealers.co.za  This test is not yet approved or cleared by the [place] and has been authorized for detection and/or diagnosis of SARS-CoV-2 by FDA under an Emergency Use Authorization (EUA).  This EUA will remain in effect (meaning this test can be used) for the duration of the COVID-19 declaration under Section 564(b)(1) of the Act, 21 U.S.C. section 360bbb-3(b)(1), unless the authorization is terminated or revoked sooner. Performed at [facility], [place] [address], [place], [place] [address]     Impression/Plan:  1. Hypoxia - improved with a small amount of lasix.  I would suspect fluid/edema rather than PJP.  I will stop the bactrim and prednisone  2. HIV - poor compliance.  CD4 < 35, viral load 92,000.  On Tivicay, Descovy, Prezcobix (Symtuza + Tivicay).  Will continue.  She has it arranged with [facility] to get by mail.   She has follow up with [place] arranged.    3.  Opportunistic infection prophylaxis - will change to Bactrim prophylaxis.    I will sign off, call with questions. thanks

## 2019-01-23 NOTE — Progress Notes (Signed)
Physical Therapy Treatment Patient Details Name: Alison Ruiz MRN: [id number] DOB: 23-Dec-1930 Today's Date: 01/23/2019    History of Present Illness Alison Ruiz is a 85 y.o. female presenting following a non-prodromal syncopal event likely multifactorial in etiology. PMH is significant for HIV not on chronic antiretrovirals, sinus arrhythmia, anxiety/depression, hyperlipidemia, resolved hepatitis B. Of note pt with another witness very near syncopal episode in which she became unresponsive while admitted on 01/19/19 and PT re-consulted.    PT Comments    Pt very pleasant and wanting to get up and move. Pt reports being very independent at home, still working 7days a week half days at [employer]. Pt with stable vital signs this session and with continued diarrhea able to perform pericare without assist. Encouraged continued mobility with nursing staff and HEP.  BP supine with HOB 40degrees 111/73 standgin 105/69, HR 108 102/66 sitting after walk, HR 109  Pt with highly fluctuant SPo2 even with new probe placed ranging from 83-100% with sitting on 2L    Follow Up Recommendations  Home health PT;Supervision for mobility/OOB     Equipment Recommendations  None recommended by PT    Recommendations for Other Services       Precautions / Restrictions Precautions Precautions: Fall Precaution Comments: diarrhea Restrictions Weight Bearing Restrictions: No    Mobility  Bed Mobility               General bed mobility comments: HOB 40 degrees with use of rail   Transfers Overall transfer level: Needs assistance   Transfers: Sit to/from Stand;Stand Pivot Transfers Sit to Stand: Supervision Stand pivot transfers: Supervision       General transfer comment: supervision for safety given history of syncope  Ambulation/Gait Ambulation/Gait assistance: Min guard   Assistive device: None Gait Pattern/deviations: Step-through pattern     General Gait Details: pt  walking with steady gait without AD with 3L for gait with sats 89%   Stairs             Wheelchair Mobility    Modified Rankin (Stroke Patients Only)       Balance Overall balance assessment: Needs assistance Sitting-balance support: Feet supported Sitting balance-Leahy Scale: Good     Standing balance support: No upper extremity supported Standing balance-Leahy Scale: Good                              Cognition Arousal/Alertness: Awake/alert Behavior During Therapy: WFL for tasks assessed/performed Overall Cognitive Status: Within Functional Limits for tasks assessed                                        Exercises General Exercises - Lower Extremity Long Arc Quad: AROM;20 reps;Seated;Both Hip Flexion/Marching: AROM;20 reps;Seated;Both    General Comments        Pertinent Vitals/Pain Pain Assessment: No/denies pain    Home Living                      Prior Function            PT Goals (current goals can now be found in the care plan section) Progress towards PT goals: Progressing toward goals    Frequency           PT Plan Current plan remains appropriate    Co-evaluation  AM-PAC PT "6 Clicks" Mobility   Outcome Measure  Help needed turning from your back to your side while in a flat bed without using bedrails?: None Help needed moving from lying on your back to sitting on the side of a flat bed without using bedrails?: None Help needed moving to and from a bed to a chair (including a wheelchair)?: A Little Help needed standing up from a chair using your arms (e.g., wheelchair or bedside chair)?: A Little Help needed to walk in hospital room?: A Little Help needed climbing 3-5 steps with a railing? : A Little 6 Click Score: 20    End of Session Equipment Utilized During Treatment: Oxygen Activity Tolerance: Patient tolerated treatment well Patient left: in chair;with call  bell/phone within reach;with chair alarm set Nurse Communication: Mobility status PT Visit Diagnosis: Other abnormalities of gait and mobility (R26.89);Difficulty in walking, not elsewhere classified (R26.2)     Time: 2620-3559 PT Time Calculation (min) (ACUTE ONLY): 27 min  Charges:  $Gait Training: 8-22 mins $Therapeutic Exercise: 8-22 mins                     Acy Orsak Pam Drown, PT Acute Rehabilitation Services Pager: [phone] Office: [place] 01/23/2019, 12:38 PM

## 2019-01-24 LAB — BASIC METABOLIC PANEL
Anion gap: 13 (ref 5–15)
BUN: 40 mg/dL — ABNORMAL HIGH (ref 8–23)
CO2: 21 mmol/L — ABNORMAL LOW (ref 22–32)
Calcium: 9.7 mg/dL (ref 8.9–10.3)
Chloride: 107 mmol/L (ref 98–111)
Creatinine, Ser: 1.21 mg/dL — ABNORMAL HIGH (ref 0.44–1.00)
GFR calc Af Amer: 47 mL/min — ABNORMAL LOW (ref >60)
GFR calc non Af Amer: 41 mL/min — ABNORMAL LOW (ref >60)
Glucose, Bld: 89 mg/dL (ref 70–99)
Potassium: 4.5 mmol/L (ref 3.5–5.1)
Sodium: 141 mmol/L (ref 135–145)

## 2019-01-24 LAB — CBC
HCT: 30.2 % — ABNORMAL LOW (ref 36.0–46.0)
Hemoglobin: 9.7 g/dL — ABNORMAL LOW (ref 12.0–15.0)
MCH: 27.3 pg (ref 26.0–34.0)
MCHC: 32.1 g/dL (ref 30.0–36.0)
MCV: 85.1 fL (ref 80.0–100.0)
Platelets: 281 10*3/uL (ref 150–400)
RBC: 3.55 MIL/uL — ABNORMAL LOW (ref 3.87–5.11)
RDW: 15.2 % (ref 11.5–15.5)
WBC: 12.3 10*3/uL — ABNORMAL HIGH (ref 4.0–10.5)
nRBC: 0 % (ref 0.0–0.2)

## 2019-01-24 LAB — OVA + PARASITE EXAM

## 2019-01-24 LAB — STOOL CULTURE REFLEX - RSASHR

## 2019-01-24 LAB — STOOL CULTURE: E coli, Shiga toxin Assay: NEGATIVE

## 2019-01-24 LAB — GLUCOSE, CAPILLARY
Glucose-Capillary: 85 mg/dL (ref 70–99)
Glucose-Capillary: 105 mg/dL — ABNORMAL HIGH (ref 70–99)

## 2019-01-24 LAB — O&P RESULT

## 2019-01-24 LAB — STOOL CULTURE REFLEX - CMPCXR

## 2019-01-24 MED ORDER — PREDNISONE 20 MG PO TABS: 20.0000 mg | ORAL_TABLET | Freq: Every day | ORAL | 0 refills | Status: AC

## 2019-01-24 MED ORDER — SULFAMETHOXAZOLE-TRIMETHOPRIM 800-160 MG PO TABS: 1.0000 | ORAL_TABLET | Freq: Every day | ORAL | 0 refills | Status: DC

## 2019-01-24 MED ORDER — PSYLLIUM 95 % PO PACK: 1.0000 | PACK | Freq: Every day | ORAL | 0 refills | Status: DC

## 2019-01-24 MED FILL — predniSONE 20 MG TABS: 20 | 4 days supply | Qty: 4 | Fill #0

## 2019-01-24 NOTE — Progress Notes (Signed)
HHC choice offered, pt [facility] [place]; Dan with [facility] called for arrangements. Aneta Mins [phone]   Quality of Patient Care RatingSelect to sort ascending or descending    Patient Survey Summary RatingSelect to sort ascending or descending     [facility]  [phone]   [place] to my Favorites Quality of Patient Care Rating5 out of 5 stars Patient Survey Summary Rating4 out of [place]  [phone] [place] to my Favorites Quality of Patient Care Rating4  out of 5 stars Patient Survey Summary [place] out of [place]  [phone]   [place] to my Favorites Quality of Patient Care Rating4  out of 5 stars Patient Survey Summary Rating4 out of [place]  [phone]   [place] to my [place]  out of 5 stars Patient Survey Summary [place] out of 5 stars  [facility]  [phone]) [phone]   Add [facility] to my Favorites Quality of Patient Care Rating4 out of 5 stars Not Available  [place]  [phone]   [place] to my Favorites Quality of Patient Care Rating4 out of 5 stars Patient Survey Summary Rating4 out of 5 stars  [place]  [phone]   [place] to my Favorites Quality of Patient Care Rating4 out of 5 stars Patient Survey Summary [place] out of [place]  [phone]   [place] to my Favorites Quality of Patient Care Rating4 out of 5 stars Patient Survey Summary Rating4 out of 5 stars  [facility]  [phone]   Add [facility] to my Favorites Quality of Patient Care Rating3  out of 5 stars Patient Survey Summary Rating3 out of [place]  [phone]   Add [place] to my Favorites Quality of Patient Care Rating3  out of 5 stars Not Available  [facility] [place]  [phone]   Add [facility] [place] to my Favorites Quality of Patient Care Rating3  out of 5 stars Patient Survey Summary Rating4 out of [place]  [phone]   [place] to my [place]  out of 5 stars Patient Survey Summary Rating4 out of [place]  [phone]   Add [facility] to my Favorites Quality of Patient Care Rating3  out of 5 stars Patient Survey Summary Rating3 out of [place]  [phone]   [place] to my Favorites Quality of Patient Care Rating3 out of 5 stars Patient Survey Summary Rating4 out of [place]  [phone]   [place] to my [place] out of 5 stars Patient Survey Summary Rating4 out of [place]  [phone]   [place] to my Favorites

## 2019-01-24 NOTE — TOC Transition Note (Signed)
Transition of Care [facility]) - CM/SW Discharge Note   Patient Details  Name: Khayla Koppenhaver MRN: [id number] Date of Birth: 09/03/1931  [facility] CM/SW Contact:  Royston Bake, RN Phone Number: 01/24/2019, 9:59 AM   Clinical Narrative:    CM talked to patient about about HHPT, [facility]; Dan with [facility] called for arrangement. Home oxygen to be provided by [facility]; [place] with [facility] called, portable tank to be delivered to the bedside and concentrator to the home.   Final next level of care: [place] Barriers to Discharge: No Barriers Identified   Patient Goals and CMS Choice Patient states their goals for this hospitalization and ongoing recovery are:: to stay at home CMS Medicare.gov Compare Post Acute Care list provided to:: Patient Choice offered to / list presented to : NA  Discharge Placement                       Discharge Plan and Services In-house Referral: NA Discharge Planning Services: CM Consult Post Acute Care Choice: NA          DME Arranged: N/A DME Agency: Ace Gins Date DME Agency Contacted: 01/24/19 Time DME Agency Contacted: [phone] Representative spoke with at DME Agency: Caryl Pina  HH Arranged: NA [place] Agency: [place] ([place]) Date HH Agency Contacted: 01/24/19 Time [place]: [phone] Representative spoke with at [place]: Dan RN  Social Determinants of Health ([place]) Interventions     Readmission Risk Interventions No flowsheet data found.

## 2019-01-24 NOTE — Progress Notes (Addendum)
Family Medicine Teaching Service Daily Progress Note Intern Pager: [phone]  Patient name: Alison Ruiz Medical record number: [id number] Date of birth: 23-Jun-1931 Age: 85 y.o. Gender: female  Primary Care Provider: Patient, No Pcp Per Consultants: CM, Dietitian Code Status: Full Code   Pt Overview and Major Events to Date:  Hospital Day: 8 01/17/2019: admitted for Loss of Consciousness 01/18/2019: Febrile to 102.1, started on zosyn (4/29-4/30), azithromycin (4/29-2/30), Bactrim DS (4/29), Bactrim (5/2- )  01/19/2019: presyncopal event. CXR w/ possible edema?  01/20/2019: Desat to 80's with PT  01/21/2019: CXR: possible atypical infection? started Bactrim + steroid taper for possible PCP, ABG pH 7.476, PCOw 31.2, pO2 60.2) 01/23/2019: restarted ppx bactrim, continues 20mg  prednisone   Assessment and Plan: Alison Ruiz is a 85 y.o. female who presented w/ prodromal syncopal event likely multifactorial in etiology. PMHx is significant for HIV (nonadherent to chronic antiretroviral), sinus arrhythmia, anxiety/depression, hyperlipidemia, resolved Hep B.   #New oxygen requirement, stable Patient continues to have 2 L O2 requirement in the last 24 hours.  Patient was provided 10 mg IV Lasix on 5/3 with minimal output but also appeared to have improved saturations.  Due to patient's low CD4 count, she was started on treatment dose Bactrim and prednisone taper, which ID discontinued on 5/4.  She was continued on Bactrim prophylaxis.  Given that patient was not on heart therapy for 2 years prior to admission and was started again on 4/29, it is possible that patient is experiencing IRIS which could result in hypoxia, dyspnea hospital pulmonary opacification..  Additionally, patient chest x-ray-3 weeks showing coarseness.  Thus, prednisone was continued at 20 mg daily on 5/4 with plans to continue x1 week.  It is likely that patient will be discharged on home oxygen until she is able to maintain saturations on  room air.  Will attempt to wean oxygen as tolerated prior to discharge today. Do not expect patient will need O2 chronically. I would expect patient to need O2 for the next month, hopefully less. Likely an acute injury that has been multifactorial with possible acute on chronic HFrEF and immune reconstitution inflammatory syndrome.   PT OT recommendations for home health   Spoke to [facility] from [facility] about O2 orders  DME oxygen ordered  Continue prednisone 20mg  daily   # Diarrhea, chronic  Pending Ova + Parasite exam (collected 5/1).  Shigella negative. Campylobacter pending. Possible dumping syndrome as she has diarrhea 30 minutes after meals. Can consider colonoscopy for visualization and tissue biopsy after discharge. Patient has 6 charted stools yesterday. Paged Dr. Linus Salmons for discharge recommendations in the setting of her low CD4 and HIV.   Continue to monitor  Continue metamucil  F/u ID about dc recs   #HIV, CD 4 <35 Infectious disease was consulted and placed patient back on [place] therapy.  At discharge, she will continue to receive medications through [facility] long outpatient pharmacy by mail.  She has further follow-up arranged with ID outpatient.   ID signed off 5/4  Pt to f/u op  # AKI Creatinine 1.21 today, down from 1.38 yesterday. Improving with no lasix administration. Patient will need f/u after discharge to check renal function.  Prior to admission, most recent Cr from 2018 showing normal renal function.   O/p lab f/u   # Syncope, acute, resolved  Likely due to combination of generalized weakness, hypotension and hypoglycemia.   Continue home PT OT   # Sinus Bradycardia, stable  HR ranging from 59-76 in last 24  hours.    Avoid BB in this patient as discharge   # Hypoglycemia, resolved 85-158. Expect elevated values with prednisone.   Check at o/p f/u   # Normocytic anemia Hemoglobin stable at 9.7   Monitor on CBC   #Anxiety/Depression  Not on any  meds controller medications  Continue to monitor  #FEN/GI:   Fluids: none  . Nutrition: DYS 2, Low Na Heart Healthy    Access: Right PIV (01/22/19)  VTE prophylaxis: Heparin 5000 Q8H   Disposition: likely discharge today  ============================================================== Subjective:  NAEO. No increased BM this morning   Objective: Temp:  [97.4 F (36.3 C)-98.3 F (36.8 C)] 98.3 F (36.8 C) (05/05 0419) Pulse Rate:  [59-76] 59 (05/05 0419) Resp:  [14-18] 18 (05/05 0419) BP: (111-191)/(73-89) 153/88 (05/05 0419) SpO2:  [93 %-100 %] 100 % (05/05 0419) Weight:  [33.4 kg] 33.4 kg (05/05 0419) 05/04 0701 - 05/05 0700 In: 1107 [P.O.:1107] Out: 50 [Urine:50]   Physical Exam: General: NAD, non-toxic, well-appearing, sitting comfortably in chair.  Cardiovascular: RRR, normal S1, S2. 2+ RP bilaterally. No BLEE.   Respiratory: CTAB. No IWOB.  Abdomen: + BS. NT, ND, soft to palpation.  Extremities: Warm and well perfused. Moving spontaneously.   Laboratory: I have personally read and reviewed all labs and imaging studies.  CBC: Recent Labs  Lab 01/17/19 1404  01/23/19 0549 01/24/19 0341  WBC 4.0   < > 8.2  8.8 12.3*  NEUTROABS 3.3  --  7.4  --   HGB 11.2*   < > 9.9*  9.7* 9.7*  HCT 36.7   < > 30.8*  30.9* 30.2*  MCV 89.7   < > 88.0  85.4 85.1  PLT 178   < > 239  223 281   < > = values in this interval not displayed.   CMP: Recent Labs  Lab 01/17/19 1404  01/18/19 0409  01/23/19 0549 01/24/19 0341  NA 138  --  140   < > 140 141  K 4.1  --  4.0   < > 4.2 4.5  CL 105  --  109   < > 107 107  CO2 20*  --  20*   < > 22 21*  GLUCOSE 289*  --  80   < > 135* 89  BUN 25*  --  23   < > 33* 40*  CREATININE 1.19*   < > 0.94   < > 1.38* 1.21*  CALCIUM 9.0  --  8.6*   < > 9.4 9.7  ALBUMIN 2.4*  --  2.1*  --   --   --    < > = values in this interval not displayed.     GFR: Estimated Creatinine Clearance: 17.9 mL/min (A) (by C-G formula based on SCr of 1.21  mg/dL (H)).  CBG: Recent Labs  Lab 01/23/19 0647 01/23/19 1134 01/23/19 1616 01/23/19 2124 01/24/19 0607  GLUCAP 127* 158* 105* 115* 85    Imaging/Diagnostic Tests: Dg Chest 2 View Result Date: 01/21/2019 MPRESSION: 1. Stable coarsened lung markings of indeterminate chronicity. If acute this may reflect atypical infection. 2. Cardiomegaly.   Ct Angio Chest Pe W Or Wo Contrast Result Date: 01/21/2019  IMPRESSION: No evidence of pulmonary embolism. Scattered atherosclerotic calcifications including coronary arteries. Diffuse BILATERAL pulmonary infiltrates greatest in LEFT lower lobe, could represent pneumonia or pulmonary edema. Aortic Atherosclerosis (ICD10-I70.0).     Wilber Oliphant, MD 01/24/2019, 6:28 AM PGY-1, [place] Intern  pager: [phone], text pages welcome

## 2019-01-25 ENCOUNTER — Other Ambulatory Visit: Payer: Self-pay

## 2019-01-25 ENCOUNTER — Ambulatory Visit (INDEPENDENT_AMBULATORY_CARE_PROVIDER_SITE_OTHER): Payer: Medicare Other | Admitting: Internal Medicine

## 2019-01-25 DIAGNOSIS — B20 Human immunodeficiency virus [HIV] disease: Secondary | ICD-10-CM

## 2019-01-25 DIAGNOSIS — R55 Syncope and collapse: Secondary | ICD-10-CM

## 2019-01-25 DIAGNOSIS — R636 Underweight: Secondary | ICD-10-CM

## 2019-01-25 DIAGNOSIS — D649 Anemia, unspecified: Secondary | ICD-10-CM | POA: Insufficient documentation

## 2019-01-25 DIAGNOSIS — Z09 Encounter for follow-up examination after completed treatment for conditions other than malignant neoplasm: Secondary | ICD-10-CM

## 2019-01-25 DIAGNOSIS — E43 Unspecified severe protein-calorie malnutrition: Secondary | ICD-10-CM

## 2019-01-25 DIAGNOSIS — N183 Chronic kidney disease, stage 3 unspecified: Secondary | ICD-10-CM

## 2019-01-25 NOTE — Progress Notes (Signed)
Virtual Visit via Telephone Note  I connected with Alison Ruiz on 01/25/19 at 10:00 AM EDT by telephone and verified that I am speaking with the correct person using two identifiers.   I discussed the limitations, risks, security and privacy concerns of performing an evaluation and management service by telephone and the availability of in person appointments. I also discussed with the patient that there may be a patient responsible charge related to this service. The patient expressed understanding and agreed to proceed.  We initially attempted to connect via video chat but were unable to due to technical difficulties on the patient's end, so we converted this visit to a phone visit.   Location patient: home Location provider: work office Participants present for the call: patient, provider Patient did not have a visit in the prior 7 days to address this/these issue(s).   History of Present Illness:  Patient has scheduled this visit to establish care also as a hospital follow-up.  She is 85 years old, works 7 days a week at [employer].  She had a syncopal episode while at work, she is unclear of the events of that day but tells me that her boss called 911 and she was transported to the hospital.  While there she was found to be febrile and hypoxic.  She does have a history of HIV/AIDS and had not been on anti-retroviral therapy for over a year and a half due to cost.  While hospitalized she was noticed to have a CD4 count of less than 35, infectious diseases was consulted and started her on HAART therapy.  She was initially treated for PCP pneumonia with steroids and Bactrim, she had AFB sputum's that were negative.  Her COVID-19 test was negative.  She was discharged on 2 to 3 L of oxygen due to persistent hypoxemia with ambulation.  Review of hospital notes by the family practice teaching service state that they believe hypoxemia was likely due to immune reconstitution syndrome.   There was no evidence of infiltrates.  ID did not believe it was PCP pneumonia.  She is now on prophylactic Bactrim and HAART.  She tells me she has follow-up planned with ID.  During that hospitalization she was also found to have stage III chronic kidney disease with a baseline creatinine between 1.2 and 1.4, she was also found to have normocytic anemia with hemoglobin around 9.7, she was also found to be severely malnourished and hypoalbuminemic.  She states she has done relatively well post discharge, has been wearing her oxygen 24/7 and taking her medications.   Observations/Objective: Patient sounds cheerful and well on the phone. I do not appreciate any increased work of breathing. Speech and thought processing are grossly intact. Patient reported vitals: None reported   Current Outpatient Medications:  .  Darunavir-Cobicisctat-Emtricitabine-Tenofovir Alafenamide (SYMTUZA) 800-150-200-10 MG TABS, Take 1 tablet by mouth daily with breakfast., Disp: 30 tablet, Rfl: 3 .  dolutegravir (TIVICAY) 50 MG tablet, Take 1 tablet (50 mg total) by mouth daily., Disp: 30 tablet, Rfl: 3 .  feeding supplement, ENSURE ENLIVE, (ENSURE ENLIVE) LIQD, Take 237 mLs by mouth 3 (three) times daily between meals., Disp: 237 mL, Rfl: 12 .  ferrous sulfate 325 (65 FE) MG tablet, Take 1 tablet (325 mg total) by mouth daily for 30 days., Disp: 30 tablet, Rfl: 0 .  Multiple Vitamin (MULTIVITAMIN WITH MINERALS) TABS tablet, Take 1 tablet by mouth daily for 30 days., Disp: 30 tablet, Rfl: 0 .  ondansetron (ZOFRAN)  4 MG tablet, Take 1 tablet (4 mg total) by mouth every 6 (six) hours as needed for nausea., Disp: 20 tablet, Rfl: 0 .  predniSONE (DELTASONE) 20 MG tablet, Take 1 tablet (20 mg total) by mouth daily with breakfast for 4 days., Disp: 4 tablet, Rfl: 0 .  psyllium (HYDROCIL/METAMUCIL) 95 % PACK, Take 1 packet by mouth daily., Disp: 240 each, Rfl: 0 .  sulfamethoxazole-trimethoprim (BACTRIM DS) 800-160 MG tablet,  Take 1 tablet by mouth daily., Disp: 30 tablet, Rfl: 3 .  sulfamethoxazole-trimethoprim (BACTRIM DS) 800-160 MG tablet, Take 1 tablet by mouth daily., Disp: 30 tablet, Rfl: 0  Review of Systems:  Constitutional: Denies fever, chills, diaphoresis, appetite change and fatigue.  HEENT: Denies photophobia, eye pain, redness, hearing loss, ear pain, congestion, sore throat, rhinorrhea, sneezing, mouth sores, trouble swallowing, neck pain, neck stiffness and tinnitus.   Respiratory: Denies SOB, DOE, cough, chest tightness,  and wheezing.   Cardiovascular: Denies chest pain, palpitations and leg swelling.  Gastrointestinal: Denies nausea, vomiting, abdominal pain, diarrhea, constipation, blood in stool and abdominal distention.  Genitourinary: Denies dysuria, urgency, frequency, hematuria, flank pain and difficulty urinating.  Endocrine: Denies: hot or cold intolerance, sweats, changes in hair or nails, polyuria, polydipsia. Musculoskeletal: Denies myalgias, back pain, joint swelling, arthralgias and gait problem.  Skin: Denies pallor, rash and wound.  Neurological: Denies dizziness, seizures, syncope, weakness, light-headedness, numbness and headaches.  Hematological: Denies adenopathy. Easy bruising, personal or family bleeding history  Psychiatric/Behavioral: Denies suicidal ideation, mood changes, confusion, nervousness, sleep disturbance and agitation   Assessment and Plan:  Hospital discharge follow-up  AIDS (acquired immune deficiency syndrome) ([place]) Human immunodeficiency virus (HIV) disease ([place])- -she is now on HAART therapy as well as prophylactic Bactrim. -Has follow-up with ID scheduled.  Severely underweight adult Protein-calorie malnutrition, severe -She is still taking daily ensures, might benefit from nutrition referral if no improvement.  Syncope, unspecified syncope type -Etiology remains unclear, suspected to be vasovagal versus orthostatic from severe dehydration and  malnutrition.  CKD (chronic kidney disease) stage 3, GFR 30-59 ml/min (HCC) -During hospital stay it appears baseline creatinine was between 1.2 and 1.4, this will need to be continuously assessed.  Normocytic anemia -Hemoglobin was 9.7 on discharge, recheck CBC in few months.    I discussed the assessment and treatment plan with the patient. The patient was provided an opportunity to ask questions and all were answered. The patient agreed with the plan and demonstrated an understanding of the instructions.   The patient was advised to call back or seek an in-person evaluation if the symptoms worsen or if the condition fails to improve as anticipated.  I provided 30 minutes of non-face-to-face time during this encounter.   Lelon Frohlich, MD [facility]

## 2019-01-26 ENCOUNTER — Ambulatory Visit: Payer: Medicare Other | Admitting: Family Medicine

## 2019-01-26 ENCOUNTER — Telehealth: Payer: Self-pay | Admitting: Internal Medicine

## 2019-01-26 NOTE — Telephone Encounter (Signed)
Ok to order 

## 2019-01-26 NOTE — Telephone Encounter (Signed)
Copied from [place] [id number]. Topic: Quick Communication - Home Health Verbal Orders >> Jan 26, 2019  1:23 PM Berneta Levins wrote: Caller/Agency: [place] with [facility] Number: [phone], OK to leave a message Requesting OT/PT/Skilled Nursing/Social Work/Speech Therapy: PT Frequency: 1 week 1, 2 week 1, 1 week 2, 1 every other week 4

## 2019-01-27 NOTE — Telephone Encounter (Signed)
Verbal orders given to [facility]

## 2019-02-07 ENCOUNTER — Inpatient Hospital Stay: Payer: Medicare Other | Admitting: Internal Medicine

## 2019-02-14 ENCOUNTER — Ambulatory Visit: Payer: Self-pay | Admitting: *Deleted

## 2019-02-14 ENCOUNTER — Other Ambulatory Visit: Payer: Self-pay

## 2019-02-14 ENCOUNTER — Telehealth: Payer: Self-pay | Admitting: *Deleted

## 2019-02-14 VITALS — BP 90/48 | HR 92

## 2019-02-14 DIAGNOSIS — R634 Abnormal weight loss: Secondary | ICD-10-CM

## 2019-02-14 DIAGNOSIS — B20 Human immunodeficiency virus [HIV] disease: Secondary | ICD-10-CM

## 2019-02-14 NOTE — Progress Notes (Signed)
Home visit made today with Ms. Pommier. On arrival Ms. Heck does not appear to be in any distress. She is walking freely throughout her home with a nasal cannula attached without an assistive device. On assessment,  Ms. Maultsby' lips and fingertips are blue. Pulse Ox check will not register results, patient is A&O and states she is minimally SOB. O2 equipment checked to include the patency of the nasal cannula and O2 settings. O2 settings at 2L and cannula is patent.  Patient instructed to take 3 large slow deep breaths in and out. After completing the breaths, pulse ox registered 100%. I think the patient is not wearing her O2 at all times like it is ordered. Vitals were assessed at this time. Patient has clear lung sounds throughout, HR is slightly irregular with a normal rate of 92 beats per minute. Patient has a really low BP but is asymptomatic. Patient denies dizziness when rising from a seated or lying position or any trouble staying awake during the day. She was completely coherent and active during our visit.  On review of her chart patient also had low BP's during her last hospitalization. Assessed the patient's nutrition and hydration. Ms. Petree' 1st response was "I probably need to drink more" and then stated she had about 4 ounces of juice this morning with her oatmeal and that was it. Instructed the patient that I want her to drink about 32 ounces of fluid (prefer water today) and that she should allow a little more salt in her diet today. Instructed Ms. Burd' that this is her homework for today and I will be back tomorrow for a BP recheck.  After completing a physical assessment, we discussed her medication adherence. Ms. Soileau stated almost 2 years ago she had to pay a copay of over 1,000 dollars for her HIV medications. The following month she paid over 200 dollars. When the third month came she could not pay the cost anymore and felt like she should give up. From that point on she has not been  taking her medications. Ms. Gershman and I discussed the financial programs that we can offer her to assist with medication cost. Ms. Sheaffer says she is aware of the programs now but didn't seek assistance previously. Currently the patient has 9 more days of her symtuza and tivicay. Contacted [facility] and the patient has already been enrolled in a copay assistance program that is good until April 30th of 2021. Ms. Weier consented to having me come and assist her with staying in care and adherent to her medications.   Next scheduled visit is for tomorrow for a BP recheck, update on medication management (meds mailed or picked up), rescheduled appt with Dr Megan Salon (e-visit or in person?) and to let Ms. Leflore know that she does not have a "no show" charge with [facility]. This was one of her concerns during today's visit.

## 2019-02-14 NOTE — Telephone Encounter (Signed)
[facility] Patient Advocate Encounter  Patient reached out concerned about the cost of her medication due to high copayment amounts with Medicare insurance. She has [phone] remaining on [name] to use to cover the copayments and make the cost $0. Alison Ruiz has a home visit with the patient 05/27 and will update her on this cost and contact us back about filling through [facility].

## 2019-02-14 NOTE — Telephone Encounter (Addendum)
Referral received from Dr Megan Salon to initiate contact with the patient. Patient was hospitalized from 4/28 to 5/5 with home health SN/PT/OT/ST initiated at discharge. Patient may feel overwhelmed with so many disciplines, so I delayed contact with the patient until this time.   Call placed today and explained to Ms Mclin my role with Dr Hale Bogus health care team. She agreed to a home visit today at 18. Focus of today's visit will be to reschedule [facility] visit, assess transportation needs, medication management, and to ensure that the patient knows about our financial assistance that is offered at [facility].

## 2019-02-15 ENCOUNTER — Ambulatory Visit: Payer: Self-pay | Admitting: *Deleted

## 2019-02-15 ENCOUNTER — Telehealth: Payer: Self-pay | Admitting: *Deleted

## 2019-02-15 VITALS — BP 104/62 | HR 82

## 2019-02-15 DIAGNOSIS — B20 Human immunodeficiency virus [HIV] disease: Secondary | ICD-10-CM

## 2019-02-16 ENCOUNTER — Telehealth: Payer: Self-pay | Admitting: Internal Medicine

## 2019-02-16 MED FILL — TIVICAY 50 MG TABLET: 50 | 30 days supply | Qty: 30 | Fill #0

## 2019-02-16 MED FILL — SYMTUZA 800-150-200-10 MG T: 800-150-200 | 30 days supply | Qty: 30 | Fill #0

## 2019-02-16 NOTE — Telephone Encounter (Signed)
COVID-19 Pre-Screening Questions: ° °Do you currently have a fever (>100 °F), chills or unexplained body aches? No  ° °Are you currently experiencing new cough, shortness of breath, sore throat, runny nose? No  °•  °Have you recently travelled outside the state of [place] in the last 14 days? No  °•  °1. Have you been in contact with someone that is currently pending confirmation of Covid19 testing or has been confirmed to have the Covid19 virus?  No  ° °

## 2019-02-20 ENCOUNTER — Ambulatory Visit (INDEPENDENT_AMBULATORY_CARE_PROVIDER_SITE_OTHER): Payer: Medicare Other | Admitting: Internal Medicine

## 2019-02-20 ENCOUNTER — Encounter: Payer: Self-pay | Admitting: Internal Medicine

## 2019-02-20 ENCOUNTER — Other Ambulatory Visit: Payer: Self-pay

## 2019-02-20 DIAGNOSIS — B20 Human immunodeficiency virus [HIV] disease: Secondary | ICD-10-CM | POA: Diagnosis present

## 2019-02-20 MED ORDER — SULFAMETHOXAZOLE-TRIMETHOPRIM 800-160 MG PO TABS: 1.0000 | ORAL_TABLET | Freq: Every day | ORAL | 11 refills | Status: AC

## 2019-02-20 MED ORDER — FERROUS SULFATE 325 (65 FE) MG PO TABS: 325.0000 mg | ORAL_TABLET | Freq: Every day | ORAL | 11 refills | Status: AC

## 2019-02-20 MED ORDER — DOLUTEGRAVIR SODIUM 50 MG PO TABS: 50.0000 mg | ORAL_TABLET | Freq: Every day | ORAL | 11 refills | Status: AC

## 2019-02-20 MED ORDER — DARUN-COBIC-EMTRICIT-TENOFAF 800-150-200-10 MG PO TABS: 1.0000 | ORAL_TABLET | Freq: Every day | ORAL | 11 refills | Status: AC

## 2019-02-20 MED FILL — SULFAMETHOXAZOLE-TMP DS TAB: 800-160 | 30 days supply | Qty: 30 | Fill #0

## 2019-02-20 NOTE — Progress Notes (Signed)
Patient Active Problem List   Diagnosis Date Noted  . Human immunodeficiency virus (HIV) disease ([place]) 02/25/2007    Priority: High  . ANXIETY 04/07/2007    Priority: Medium  . AIDS (acquired immune deficiency syndrome) ([place]) 01/25/2019  . CKD (chronic kidney disease) stage 3, GFR 30-59 ml/min (HCC) 01/25/2019  . Normocytic anemia 01/25/2019  . Syncope 01/19/2019  . Protein-calorie malnutrition, severe 01/19/2019  . Abnormal serum level of alkaline phosphatase 01/18/2019  . Hypoalbuminemia 01/18/2019  . Left bundle branch block (LBBB) on electrocardiogram 01/18/2019  . Severely underweight adult 01/18/2019  . AKI (acute kidney injury) ([place]) 01/18/2019  . Bradycardia   . Hypoglycemia   . Loss of consciousness ([place]) 01/17/2019  . Unintentional weight loss 07/05/2017  . Dyslipidemia 11/12/2011  . Depression 04/07/2007  . Dental caries 04/07/2007  . HEPATITIS B, HX OF 04/07/2007  . SYNCOPE, HX OF 04/07/2007    Patient's Medications  New Prescriptions   No medications on file  Previous Medications   FEEDING SUPPLEMENT, ENSURE ENLIVE, (ENSURE ENLIVE) LIQD    Take 237 mLs by mouth 3 (three) times daily between meals.  Modified Medications   Modified Medication Previous Medication   DARUNAVIR-COBICISCTAT-EMTRICITABINE-TENOFOVIR ALAFENAMIDE (SYMTUZA) 800-150-200-10 MG TABS Darunavir-Cobicisctat-Emtricitabine-Tenofovir Alafenamide (SYMTUZA) 800-150-200-10 MG TABS      Take 1 tablet by mouth daily with breakfast.    Take 1 tablet by mouth daily with breakfast.   DOLUTEGRAVIR (TIVICAY) 50 MG TABLET dolutegravir (TIVICAY) 50 MG tablet      Take 1 tablet (50 mg total) by mouth daily.    Take 1 tablet (50 mg total) by mouth daily.   FERROUS SULFATE 325 (65 FE) MG TABLET ferrous sulfate 325 (65 FE) MG tablet      Take 1 tablet (325 mg total) by mouth daily for 30 days.    Take 1 tablet (325 mg total) by mouth daily for 30 days.   SULFAMETHOXAZOLE-TRIMETHOPRIM (BACTRIM DS)  800-160 MG TABLET sulfamethoxazole-trimethoprim (BACTRIM DS) 800-160 MG tablet      Take 1 tablet by mouth daily.    Take 1 tablet by mouth daily.  Discontinued Medications   MULTIPLE VITAMIN (MULTIVITAMIN WITH MINERALS) TABS TABLET    Take 1 tablet by mouth daily for 30 days.   ONDANSETRON (ZOFRAN) 4 MG TABLET    Take 1 tablet (4 mg total) by mouth every 6 (six) hours as needed for nausea.   PSYLLIUM (HYDROCIL/METAMUCIL) 95 % PACK    Take 1 packet by mouth daily.   SULFAMETHOXAZOLE-TRIMETHOPRIM (BACTRIM DS) 800-160 MG TABLET    Take 1 tablet by mouth daily.    Subjective: Ms. Pollinger is in for her first visit since December 2018.  Community outreach nurse tried reaching her but she would not answer her door or phone.  She was hospitalized recently with dehydration and syncope.  She had been off of her HIV medication for over 1 year after her co-pay changed and became too expensive for her to afford.  She is still been working at the local [employer] up until the time of her hospitalization.  She has been out of work since she left the hospital.  She was started on [place].  They is being mailed to her.  She has not been taking them together.  She says that she is using a pillbox.  She has been using a pillbox.  Review of Systems: Review of Systems  Constitutional: Positive for malaise/fatigue. Negative for  chills, diaphoresis and fever.  Respiratory: Negative for cough, sputum production and shortness of breath.   Cardiovascular: Negative for chest pain.  Gastrointestinal: Negative for abdominal pain, diarrhea, nausea and vomiting.  Psychiatric/Behavioral: Negative for depression.    Past Medical History:  Diagnosis Date  . Abnormal serum level of alkaline phosphatase 01/18/2019  . AKI (acute kidney injury) ([place]) 12/2018  . GRIEF REACTION, ACUTE 05/24/2008   Annotation: after brother's death 10-25-2022 Qualifier: Diagnosis of  By: Megan Salon MD, Machelle Raybon    . HIV (human immunodeficiency virus  infection) ([place])   . Loss of consciousness ([place]) 12/2018    Social History   Tobacco Use  . Smoking status: Never Smoker  . Smokeless tobacco: Never Used  Substance Use Topics  . Alcohol use: No  . Drug use: No    No family history on file.  No Known Allergies  Health Maintenance  Topic Date Due  . TETANUS/TDAP  08[birth year]09/1950  . DEXA SCAN  08/09/1998  . PNA vac Low Risk Adult (2 of 2 - PCV13) 04/06/2008  . INFLUENZA VACCINE  04/22/2019    Objective:  Vitals:   02/20/19 1059  BP: 131/73  Pulse: 71  Temp: 98 F (36.7 C)  TempSrc: Oral  Weight: 78 lb (35.4 kg)   Body mass index is 16.88 kg/m.  Physical Exam Constitutional:      Comments: She is thin and frail but has gained a little over 4 pounds since she left the hospital.  HENT:     Mouth/Throat:     Pharynx: No oropharyngeal exudate.     Comments: She is edentulous. Cardiovascular:     Rate and Rhythm: Normal rate and regular rhythm.     Heart sounds: No murmur.  Pulmonary:     Effort: Pulmonary effort is normal.     Breath sounds: Normal breath sounds.  Psychiatric:        Mood and Affect: Mood normal.     Lab Results Lab Results  Component Value Date   WBC 12.3 (H) 01/24/2019   HGB 9.7 (L) 01/24/2019   HCT 30.2 (L) 01/24/2019   MCV 85.1 01/24/2019   PLT 281 01/24/2019    Lab Results  Component Value Date   CREATININE 1.21 (H) 01/24/2019   BUN 40 (H) 01/24/2019   NA 141 01/24/2019   K 4.5 01/24/2019   CL 107 01/24/2019   CO2 21 (L) 01/24/2019    Lab Results  Component Value Date   ALT 28 01/18/2019   AST 36 01/18/2019   ALKPHOS 265 (H) 01/18/2019   BILITOT 0.5 01/18/2019    Lab Results  Component Value Date   CHOL 161 12/29/2012   HDL 63 12/29/2012   LDLCALC 86 12/29/2012   TRIG 58 12/29/2012   CHOLHDL 2.6 12/29/2012   Lab Results  Component Value Date   LABRPR NON REAC 12/29/2012   HIV 1 RNA Quant  Date Value  01/17/2019 92,200 copies/mL  08/19/2017 180 copies/mL (H)   07/05/2017 73,600 Copies/mL (H)   HIV-1 RNA Viral Load (no units)  Date Value  10/15/2011 <40  07/16/2011 <40   CD4 (no units)  Date Value  10/15/2011 461  07/16/2011 421   CD4 T Cell Abs (/uL)  Date Value  01/17/2019 <35 (L)  08/19/2017 100 (L)  07/05/2017 60 (L)     Problem List Items Addressed This Visit      High   Human immunodeficiency virus (HIV) disease ([place])    She is improving clinically  now that she is back on antiretroviral therapy.  She will get lab work today and follow-up in 4 weeks.      Relevant Medications   Darunavir-Cobicisctat-Emtricitabine-Tenofovir Alafenamide (SYMTUZA) 800-150-200-10 MG TABS   dolutegravir (TIVICAY) 50 MG tablet   sulfamethoxazole-trimethoprim (BACTRIM DS) 800-160 MG tablet   Other Relevant Orders   T-helper cell (CD4)- ([facility] clinic only)   HIV-1 RNA quant-no reflex-bld   CBC   Comprehensive metabolic panel        Michel Bickers, MD [facility] [place] [phone] pager   [phone] cell 02/20/2019, 11:36 AM

## 2019-02-20 NOTE — Assessment & Plan Note (Signed)
She is improving clinically now that she is back on antiretroviral therapy.  She will get lab work today and follow-up in 4 weeks.

## 2019-02-21 ENCOUNTER — Telehealth: Payer: Self-pay | Admitting: Pharmacy Technician

## 2019-02-21 LAB — T-HELPER CELL (CD4) - (RCID CLINIC ONLY)
CD4 % Helper T Cell: 3 % — ABNORMAL LOW (ref 33–65)
CD4 T Cell Abs: <35 /uL — ABNORMAL LOW (ref 400–1790)

## 2019-02-21 NOTE — Telephone Encounter (Addendum)
[facility] Patient Advocate Encounter  Received notification from the [facility] that the patient's ferrous sulfate 325mg  was not covered by [employer] or her patient assistance grant. The over the counter price to fill a medication as a script without running the insurance is $9.00 for 30 tablets but just buying the medication at a store is around $2.00 for 100 tablets. Left a voicemail for the patient to call us so we could inform her to get the medication at any pharmacy over the counter. [facility] is unable to mail over the counter medications without processing as a prescription.  Bartholomew Crews, CPhT Specialty Pharmacy Patient [facility] Phone: [phone] Fax: [phone] 02/21/2019 11:14 AM

## 2019-02-28 ENCOUNTER — Telehealth: Payer: Self-pay | Admitting: *Deleted

## 2019-02-28 LAB — COMPREHENSIVE METABOLIC PANEL
AG Ratio: 1 (calc) (ref 1.0–2.5)
ALT: 42 U/L — ABNORMAL HIGH (ref 6–29)
AST: 50 U/L — ABNORMAL HIGH (ref 10–35)
Albumin: 3.1 g/dL — ABNORMAL LOW (ref 3.6–5.1)
Alkaline phosphatase (APISO): 144 U/L (ref 37–153)
BUN/Creatinine Ratio: 21 (calc) (ref 6–22)
BUN: 23 mg/dL (ref 7–25)
CO2: 28 mmol/L (ref 20–32)
Calcium: 9.3 mg/dL (ref 8.6–10.4)
Chloride: 104 mmol/L (ref 98–110)
Creat: 1.11 mg/dL — ABNORMAL HIGH (ref 0.60–0.88)
Globulin: 3 g/dL (calc) (ref 1.9–3.7)
Glucose, Bld: 70 mg/dL (ref 65–99)
Potassium: 5.9 mmol/L — ABNORMAL HIGH (ref 3.5–5.3)
Sodium: 138 mmol/L (ref 135–146)
Total Bilirubin: 0.3 mg/dL (ref 0.2–1.2)
Total Protein: 6.1 g/dL (ref 6.1–8.1)

## 2019-02-28 LAB — CBC
HCT: 33.2 % — ABNORMAL LOW (ref 35.0–45.0)
Hemoglobin: 10.3 g/dL — ABNORMAL LOW (ref 11.7–15.5)
MCH: 28.3 pg (ref 27.0–33.0)
MCHC: 31 g/dL — ABNORMAL LOW (ref 32.0–36.0)
MCV: 91.2 fL (ref 80.0–100.0)
MPV: 10.6 fL (ref 7.5–12.5)
Platelets: 210 10*3/uL (ref 140–400)
RBC: 3.64 10*6/uL — ABNORMAL LOW (ref 3.80–5.10)
RDW: 18.6 % — ABNORMAL HIGH (ref 11.0–15.0)
WBC: 3.1 10*3/uL — ABNORMAL LOW (ref 3.8–10.8)

## 2019-02-28 LAB — HIV-1 RNA QUANT-NO REFLEX-BLD
HIV 1 RNA Quant: 812 copies/mL — ABNORMAL HIGH
HIV-1 RNA Quant, Log: 2.91 Log copies/mL — ABNORMAL HIGH

## 2019-02-28 NOTE — Telephone Encounter (Signed)
-----   Message from Erline Hau, MD sent at 02/28/2019 12:45 PM EDT ----- Regarding: RE: elevated potassium Thank you. Will have it repeated in 1 week.. Dr. Megan Salon, if she still has renal impairment and hyperkalemia, can we substitute something else for the bactrim? I am assuming this is PCP prophylaxis...  Apolonio Schneiders, can we schedule a BMET in 1 week?  Domingo Mend  ----- Message ----- From: Loma Boston, RN Sent: 02/28/2019  11:58 AM EDT To: Michel Bickers, MD, # Subject: elevated potassium                             Good Morning Dr Jerilee Hoh,  Ms. Kaster has an elevated potassium level of 5.9 and a month ago she was only 4.5.  She is not on a potassium supplement or a ACE so I wondered if this could be related to her kidney function. I was a little concerned for Ms Topor, and wanted to run the information by you.  Kinnie Scales BSN RN

## 2019-03-06 LAB — ACID FAST CULTURE WITH REFLEXED SENSITIVITIES (MYCOBACTERIA): Acid Fast Culture: NEGATIVE

## 2019-03-07 ENCOUNTER — Telehealth: Payer: Self-pay | Admitting: *Deleted

## 2019-03-07 DIAGNOSIS — R899 Unspecified abnormal finding in specimens from other organs, systems and tissues: Secondary | ICD-10-CM

## 2019-03-07 NOTE — Telephone Encounter (Signed)
Attempted to call but the mailbox is full.  Will try again at another time. CRM

## 2019-03-07 NOTE — Telephone Encounter (Signed)
-----   Message from Erline Hau, MD sent at 02/28/2019 12:45 PM EDT ----- Regarding: RE: elevated potassium Thank you. Will have it repeated in 1 week.. Dr. Megan Salon, if she still has renal impairment and hyperkalemia, can we substitute something else for the bactrim? I am assuming this is PCP prophylaxis...  Apolonio Schneiders, can we schedule a BMET in 1 week?  Domingo Mend  ----- Message ----- From: Loma Boston, RN Sent: 02/28/2019  11:58 AM EDT To: Michel Bickers, MD, # Subject: elevated potassium                             Good Morning Dr Jerilee Hoh,  Ms. Hossain has an elevated potassium level of 5.9 and a month ago she was only 4.5.  She is not on a potassium supplement or a ACE so I wondered if this could be related to her kidney function. I was a little concerned for Ms Tiller, and wanted to run the information by you.  Kinnie Scales BSN RN

## 2019-03-10 NOTE — Telephone Encounter (Signed)
Spoke with patient and she will call back for a lab appointment when she has transportation. Lab order placed.

## 2019-03-14 MED FILL — SYMTUZA 800-150-200-10 MG T: 800-150-200 | 30 days supply | Qty: 30 | Fill #1

## 2019-03-14 MED FILL — TIVICAY 50 MG TABLET: 50 | 30 days supply | Qty: 30 | Fill #1

## 2019-03-14 MED FILL — FERROUS SULFATE 325 MG TAB: 325 (65 FE) | 30 days supply | Qty: 30 | Fill #0

## 2019-03-15 MED FILL — SULFAMETHOXAZOLE-TMP DS TAB: 800-160 | 30 days supply | Qty: 30 | Fill #1

## 2019-03-20 ENCOUNTER — Ambulatory Visit (INDEPENDENT_AMBULATORY_CARE_PROVIDER_SITE_OTHER): Payer: Medicare Other | Admitting: Internal Medicine

## 2019-03-20 ENCOUNTER — Encounter: Payer: Self-pay | Admitting: Internal Medicine

## 2019-03-20 ENCOUNTER — Other Ambulatory Visit: Payer: Self-pay

## 2019-03-20 DIAGNOSIS — B20 Human immunodeficiency virus [HIV] disease: Secondary | ICD-10-CM

## 2019-03-20 NOTE — Progress Notes (Signed)
BP recheck made today and patient noted to have nasal canula on the bridge of her nose when greeting me at the door. Once the patient was seated I educated her that the reason for her blue lips and finger tips is because they lack oxygen. I told patient that she must keep her oxygen on at all times especially during the night.  Instructed the patient that my concern is that she will not wear her oxygen and her family will find her unconscious. Ms. Nilan verbalized understanding and questioned if she would always have to have oxygen. Advised Ms Atha that for now the oxygen is important but she may be able to live without the oxygen once she becomes stronger. Updated Ms. Boehringer on medication management (meds mailed or picked up), rescheduled appt with Dr Megan Salon (e-visit or in person?) and to let Ms. Ure know that she does not have a "no show" charge with [facility]. This was one of her concerns during today's visit.

## 2019-03-20 NOTE — Progress Notes (Signed)
Patient Active Problem List   Diagnosis Date Noted  . Human immunodeficiency virus (HIV) disease ([place]) 02/25/2007    Priority: High  . ANXIETY 04/07/2007    Priority: Medium  . AIDS (acquired immune deficiency syndrome) ([place]) 01/25/2019  . CKD (chronic kidney disease) stage 3, GFR 30-59 ml/min (HCC) 01/25/2019  . Normocytic anemia 01/25/2019  . Syncope 01/19/2019  . Protein-calorie malnutrition, severe 01/19/2019  . Abnormal serum level of alkaline phosphatase 01/18/2019  . Hypoalbuminemia 01/18/2019  . Left bundle branch block (LBBB) on electrocardiogram 01/18/2019  . Severely underweight adult 01/18/2019  . AKI (acute kidney injury) ([place]) 01/18/2019  . Bradycardia   . Hypoglycemia   . Loss of consciousness ([place]) 01/17/2019  . Unintentional weight loss 07/05/2017  . Dyslipidemia 11/12/2011  . Depression 04/07/2007  . Dental caries 04/07/2007  . HEPATITIS B, HX OF 04/07/2007  . SYNCOPE, HX OF 04/07/2007    Patient's Medications  New Prescriptions   No medications on file  Previous Medications   DARUNAVIR-COBICISCTAT-EMTRICITABINE-TENOFOVIR ALAFENAMIDE (SYMTUZA) 800-150-200-10 MG TABS    Take 1 tablet by mouth daily with breakfast.   DOLUTEGRAVIR (TIVICAY) 50 MG TABLET    Take 1 tablet (50 mg total) by mouth daily.   FEEDING SUPPLEMENT, ENSURE ENLIVE, (ENSURE ENLIVE) LIQD    Take 237 mLs by mouth 3 (three) times daily between meals.   FERROUS SULFATE 325 (65 FE) MG TABLET    Take 1 tablet (325 mg total) by mouth daily for 30 days.   SULFAMETHOXAZOLE-TRIMETHOPRIM (BACTRIM DS) 800-160 MG TABLET    Take 1 tablet by mouth daily.  Modified Medications   No medications on file  Discontinued Medications   No medications on file    Subjective: Alison Ruiz is in for her routine HIV follow-up visit.  She has had no problems obtaining, taking or tolerating her Symtuza and Tivicay.  She does not know the names of her medications but can pick them off of the chart.  She is  using a pillbox.  She denies missing any doses.  She is feeling better.  She has more energy and her appetite has improved.  Review of Systems: Review of Systems  Constitutional: Negative for fever and weight loss.  Respiratory: Negative for cough and shortness of breath.   Cardiovascular: Negative for chest pain.  Gastrointestinal: Negative for abdominal pain, diarrhea, nausea and vomiting.  Psychiatric/Behavioral: Negative for depression.    Past Medical History:  Diagnosis Date  . Abnormal serum level of alkaline phosphatase 01/18/2019  . AKI (acute kidney injury) ([place]) 12/2018  . GRIEF REACTION, ACUTE 05/24/2008   Annotation: after brother's death 10/04/22 Qualifier: Diagnosis of  By: Megan Salon MD, Correna Meacham    . HIV (human immunodeficiency virus infection) ([place])   . Loss of consciousness ([place]) 12/2018    Social History   Tobacco Use  . Smoking status: Never Smoker  . Smokeless tobacco: Never Used  Substance Use Topics  . Alcohol use: No  . Drug use: No    No family history on file.  No Known Allergies  Health Maintenance  Topic Date Due  . TETANUS/TDAP  08/09/1952  . DEXA SCAN  08/09/1998  . PNA vac Low Risk Adult (2 of 2 - PCV13) 04/06/2008  . INFLUENZA VACCINE  04/22/2019    Objective:  Vitals:   03/20/19 0905  BP: (!) 153/76  Pulse: 60  Temp: 98.3 F (36.8 C)  TempSrc: Oral  Weight: 81 lb 12.8 oz (37.1 kg)  Body mass index is 17.7 kg/m.  Physical Exam Constitutional:      Comments: Her spirits are good as usual.  She is gained 8 pounds in the past 2 months.  Cardiovascular:     Rate and Rhythm: Normal rate and regular rhythm.     Heart sounds: No murmur.  Pulmonary:     Effort: Pulmonary effort is normal.     Breath sounds: Normal breath sounds.  Psychiatric:        Mood and Affect: Mood normal.     Lab Results Lab Results  Component Value Date   WBC 3.1 (L) 02/20/2019   HGB 10.3 (L) 02/20/2019   HCT 33.2 (L) 02/20/2019   MCV 91.2 02/20/2019    PLT 210 02/20/2019    Lab Results  Component Value Date   CREATININE 1.11 (H) 02/20/2019   BUN 23 02/20/2019   NA 138 02/20/2019   K 5.9 (H) 02/20/2019   CL 104 02/20/2019   CO2 28 02/20/2019    Lab Results  Component Value Date   ALT 42 (H) 02/20/2019   AST 50 (H) 02/20/2019   ALKPHOS 265 (H) 01/18/2019   BILITOT 0.3 02/20/2019    Lab Results  Component Value Date   CHOL 161 12/29/2012   HDL 63 12/29/2012   LDLCALC 86 12/29/2012   TRIG 58 12/29/2012   CHOLHDL 2.6 12/29/2012   Lab Results  Component Value Date   LABRPR NON REAC 12/29/2012   HIV 1 RNA Quant (copies/mL)  Date Value  02/20/2019 812 (H)  01/17/2019 92,200  08/19/2017 180 (H)   HIV-1 RNA Viral Load (no units)  Date Value  10/15/2011 <40  07/16/2011 <40   CD4 (no units)  Date Value  10/15/2011 461  07/16/2011 421   CD4 T Cell Abs (/uL)  Date Value  02/20/2019 <35 (L)  01/17/2019 <35 (L)  08/19/2017 100 (L)     Problem List Items Addressed This Visit      High   Human immunodeficiency virus (HIV) disease ([place])    She is making slow but steady progress since she restarted antiretroviral therapy several months ago.  I will recheck blood work today and see her back in 4 weeks.      Relevant Orders   T-helper cell (CD4)- ([facility] clinic only)   HIV-1 RNA quant-no reflex-bld   CBC   Basic metabolic panel        Michel Bickers, MD [facility] for [place] [phone] pager   [phone] cell 03/20/2019, 9:37 AM

## 2019-03-20 NOTE — Assessment & Plan Note (Signed)
She is making slow but steady progress since she restarted antiretroviral therapy several months ago.  I will recheck blood work today and see her back in 4 weeks.

## 2019-03-21 LAB — T-HELPER CELL (CD4) - (RCID CLINIC ONLY)
CD4 % Helper T Cell: 4 % — ABNORMAL LOW (ref 33–65)
CD4 T Cell Abs: 47 /uL — ABNORMAL LOW (ref 400–1790)

## 2019-03-27 NOTE — Telephone Encounter (Signed)
Patient would like a call back to schedule labs. No answer at office.

## 2019-03-28 LAB — CBC
HCT: 30.5 % — ABNORMAL LOW (ref 35.0–45.0)
Hemoglobin: 9.7 g/dL — ABNORMAL LOW (ref 11.7–15.5)
MCH: 29.8 pg (ref 27.0–33.0)
MCHC: 31.8 g/dL — ABNORMAL LOW (ref 32.0–36.0)
MCV: 93.6 fL (ref 80.0–100.0)
MPV: 9.7 fL (ref 7.5–12.5)
Platelets: 220 10*3/uL (ref 140–400)
RBC: 3.26 10*6/uL — ABNORMAL LOW (ref 3.80–5.10)
RDW: 18.8 % — ABNORMAL HIGH (ref 11.0–15.0)
WBC: 4.8 10*3/uL (ref 3.8–10.8)

## 2019-03-28 LAB — BASIC METABOLIC PANEL
BUN/Creatinine Ratio: 20 (calc) (ref 6–22)
BUN: 22 mg/dL (ref 7–25)
CO2: 25 mmol/L (ref 20–32)
Calcium: 9.9 mg/dL (ref 8.6–10.4)
Chloride: 107 mmol/L (ref 98–110)
Creat: 1.12 mg/dL — ABNORMAL HIGH (ref 0.60–0.88)
Glucose, Bld: 72 mg/dL (ref 65–99)
Potassium: 4.8 mmol/L (ref 3.5–5.3)
Sodium: 140 mmol/L (ref 135–146)

## 2019-03-28 LAB — HIV-1 RNA QUANT-NO REFLEX-BLD
HIV 1 RNA Quant: 349 copies/mL — ABNORMAL HIGH
HIV-1 RNA Quant, Log: 2.54 Log copies/mL — ABNORMAL HIGH

## 2019-03-29 NOTE — Telephone Encounter (Signed)
I approve of this plan of care. 

## 2019-03-29 NOTE — Telephone Encounter (Signed)
Order received on 02/14/2019 by Dr Megan Salon to evaluate patient for [facility]).  Patient was evaluated on 02/14/2019 for [facility]NS. Patient was consented to care at this time.   Frequency / Duration of CBHCN visits: Effective 02/15/2019 for 19mo3 4 PRN's for complications with disease process/progression, medication changes or concerns   [facility]HCN will assess for learning needs related to diagnosis and treatment regimen, provide education as needed, fill pill box if needed, address any barriers which may be preventing medication compliance, and communicating with care team including physician and case manager. RN may assess O2 satuaration via pulse oximetry during home visits. If O2 saturations are less than 88% sustained with O2 in place MD will be notified for further orders.   Individualized Plan Of Care 02/15/2019 to 05/16/2019   a. Type of service(s) and care to be delivered: RN Case Management  b. Frequency and duration of service: Effective 02/15/2019 1mo3, 4 prns for complications with disease process/progression, medication changes or concerns . Visits/Contact may be conducted telephonically due to [place] or in person to best suit the patient.  c. Activity restrictions: Pt may be up as tolerated and can safely ambulate without the need for a assistive device   d. Safety Measures: Standard Precautions/Infection Control   e. Service Objectives and Goals: Service Objectives are to assist the pt with HIV medication regimen adherence and staying in care with the Infectious Disease Clinic by identifying barriers to care. RN will address the barriers that are identified by the patient.   f. Equipment required: Oxygen via nasal cannula set at 2L continuously   g. Functional Limitations: Vision. Pt has corrective glasses that she wears   h. Rehabilitation potential: Guarded   i. Diet and Nutritional Needs: Regular Diet   j. Medications and treatments: Medications  have been reconciled and reviewed and are a part of EPIC electronic file   k. Specific therapies if needed: RN   l. Pertinent diagnoses: HIV disease,  Hx of medication NonCompliance, Kidney Disease, O2 dependency, Hx of snycopal episode, anemia, underweight  m. Expected outcome: Guarded

## 2019-03-31 ENCOUNTER — Telehealth: Payer: Self-pay | Admitting: *Deleted

## 2019-03-31 NOTE — Telephone Encounter (Signed)
Attempted to call patient, but the mailbox is full. Will try again at another time.

## 2019-03-31 NOTE — Telephone Encounter (Signed)
Copied from [place] [id number]. Topic: General - Other >> Mar 31, 2019  3:17 PM Leward Quan A wrote: Reason for CRM: Patient called to schedule an appointment with Dr Jerilee Hoh, Please call her to schedule this appointment Ph# [phone]

## 2019-04-04 NOTE — Telephone Encounter (Signed)
Spoke with patient and an appointment not needed at this time.

## 2019-04-11 MED FILL — TIVICAY 50 MG TABLET: 50 | 30 days supply | Qty: 30 | Fill #2

## 2019-04-11 MED FILL — SYMTUZA 800-150-200-10 MG T: 800-150-200 | 30 days supply | Qty: 30 | Fill #2

## 2019-04-11 MED FILL — FERROUS SULFATE 325 MG TAB: 325 (65 FE) | 30 days supply | Qty: 30 | Fill #1

## 2019-04-11 MED FILL — SULFAMETHOXAZOLE-TMP DS TAB: 800-160 | 30 days supply | Qty: 30 | Fill #2

## 2019-04-18 ENCOUNTER — Encounter: Payer: Self-pay | Admitting: Internal Medicine

## 2019-04-18 ENCOUNTER — Ambulatory Visit (INDEPENDENT_AMBULATORY_CARE_PROVIDER_SITE_OTHER): Payer: Medicare Other | Admitting: Internal Medicine

## 2019-04-18 ENCOUNTER — Other Ambulatory Visit: Payer: Self-pay

## 2019-04-18 DIAGNOSIS — B20 Human immunodeficiency virus [HIV] disease: Secondary | ICD-10-CM | POA: Diagnosis present

## 2019-04-18 DIAGNOSIS — R634 Abnormal weight loss: Secondary | ICD-10-CM | POA: Diagnosis not present

## 2019-04-18 DIAGNOSIS — R0902 Hypoxemia: Secondary | ICD-10-CM | POA: Diagnosis not present

## 2019-04-18 NOTE — Assessment & Plan Note (Signed)
Her infection is coming under control since restarting therapy 3 months ago.  She will continue Tivicay and Symtuza and follow-up after lab work in 3 months.

## 2019-04-18 NOTE — Progress Notes (Signed)
Patient Active Problem List   Diagnosis Date Noted  . Human immunodeficiency virus (HIV) disease ([place]) 02/25/2007    Priority: High  . ANXIETY 04/07/2007    Priority: Medium  . Hypoxia 04/18/2019  . AIDS (acquired immune deficiency syndrome) ([place]) 01/25/2019  . CKD (chronic kidney disease) stage 3, GFR 30-59 ml/min (HCC) 01/25/2019  . Normocytic anemia 01/25/2019  . Syncope 01/19/2019  . Protein-calorie malnutrition, severe 01/19/2019  . Abnormal serum level of alkaline phosphatase 01/18/2019  . Hypoalbuminemia 01/18/2019  . Left bundle branch block (LBBB) on electrocardiogram 01/18/2019  . Severely underweight adult 01/18/2019  . AKI (acute kidney injury) ([place]) 01/18/2019  . Bradycardia   . Hypoglycemia   . Loss of consciousness ([place]) 01/17/2019  . Unintentional weight loss 07/05/2017  . Dyslipidemia 11/12/2011  . Depression 04/07/2007  . Dental caries 04/07/2007  . HEPATITIS B, HX OF 04/07/2007  . SYNCOPE, HX OF 04/07/2007    Patient's Medications  New Prescriptions   No medications on file  Previous Medications   DARUNAVIR-COBICISCTAT-EMTRICITABINE-TENOFOVIR ALAFENAMIDE (SYMTUZA) 800-150-200-10 MG TABS    Take 1 tablet by mouth daily with breakfast.   DOLUTEGRAVIR (TIVICAY) 50 MG TABLET    Take 1 tablet (50 mg total) by mouth daily.   FEEDING SUPPLEMENT, ENSURE ENLIVE, (ENSURE ENLIVE) LIQD    Take 237 mLs by mouth 3 (three) times daily between meals.   FERROUS SULFATE 325 (65 FE) MG TABLET    Take 1 tablet (325 mg total) by mouth daily for 30 days.   SULFAMETHOXAZOLE-TRIMETHOPRIM (BACTRIM DS) 800-160 MG TABLET    Take 1 tablet by mouth daily.  Modified Medications   No medications on file  Discontinued Medications   No medications on file    Subjective: Alison Ruiz is in for her routine HIV follow-up visit.  She is feeling much better.  She has more energy and she is gaining weight.  She is still using her supplemental oxygen.  She uses it all the time when  she is at home and carries it with her when she is away from home.  She has not noted any shortness of breath with exertion when not using it recently.  She has a complete list of her medication.  She denies missing any doses of her Tivicay or her Symtuza.  Review of Systems: Review of Systems  Constitutional: Negative for chills, diaphoresis, fever, malaise/fatigue and weight loss.  HENT: Negative for sore throat.   Respiratory: Negative for cough, sputum production and shortness of breath.   Cardiovascular: Negative for chest pain.  Gastrointestinal: Negative for abdominal pain, diarrhea, heartburn, nausea and vomiting.  Genitourinary: Negative for dysuria and frequency.  Musculoskeletal: Negative for joint pain and myalgias.  Skin: Negative for rash.  Neurological: Negative for dizziness and headaches.  Psychiatric/Behavioral: Negative for depression and substance abuse. The patient is not nervous/anxious.     Past Medical History:  Diagnosis Date  . Abnormal serum level of alkaline phosphatase 01/18/2019  . AKI (acute kidney injury) ([place]) 12/2018  . GRIEF REACTION, ACUTE 05/24/2008   Annotation: after brother's death 10/24/2022 Qualifier: Diagnosis of  By: Megan Salon MD, Desa Rech    . HIV (human immunodeficiency virus infection) ([place])   . Loss of consciousness ([place]) 12/2018    Social History   Tobacco Use  . Smoking status: Never Smoker  . Smokeless tobacco: Never Used  Substance Use Topics  . Alcohol use: No  . Drug use: No    No family  history on file.  No Known Allergies  Health Maintenance  Topic Date Due  . TETANUS/TDAP  08/09/1952  . DEXA SCAN  08/09/1998  . PNA vac Low Risk Adult (2 of 2 - PCV13) 04/06/2008  . INFLUENZA VACCINE  04/22/2019    Objective:  Vitals:   04/18/19 1052  BP: 135/71  Pulse: 72  Temp: 98 F (36.7 C)  SpO2: 98%   There is no height or weight on file to calculate BMI.  Physical Exam Constitutional:      Comments: She has gained about 10  pounds since May.  She is in good spirits.  Cardiovascular:     Rate and Rhythm: Normal rate and regular rhythm.     Heart sounds: No murmur.  Pulmonary:     Effort: Pulmonary effort is normal.     Breath sounds: Normal breath sounds. No wheezing or rales.  Abdominal:     Palpations: Abdomen is soft.     Tenderness: There is no abdominal tenderness.  Musculoskeletal:        General: No swelling or tenderness.  Skin:    Findings: No rash.  Neurological:     General: No focal deficit present.  Psychiatric:        Mood and Affect: Mood normal.     Lab Results Lab Results  Component Value Date   WBC 4.8 03/20/2019   HGB 9.7 (L) 03/20/2019   HCT 30.5 (L) 03/20/2019   MCV 93.6 03/20/2019   PLT 220 03/20/2019    Lab Results  Component Value Date   CREATININE 1.12 (H) 03/20/2019   BUN 22 03/20/2019   NA 140 03/20/2019   K 4.8 03/20/2019   CL 107 03/20/2019   CO2 25 03/20/2019    Lab Results  Component Value Date   ALT 42 (H) 02/20/2019   AST 50 (H) 02/20/2019   ALKPHOS 265 (H) 01/18/2019   BILITOT 0.3 02/20/2019    Lab Results  Component Value Date   CHOL 161 12/29/2012   HDL 63 12/29/2012   LDLCALC 86 12/29/2012   TRIG 58 12/29/2012   CHOLHDL 2.6 12/29/2012   Lab Results  Component Value Date   LABRPR NON REAC 12/29/2012   HIV 1 RNA Quant (copies/mL)  Date Value  03/20/2019 349 (H)  02/20/2019 812 (H)  01/17/2019 92,200   HIV-1 RNA Viral Load (no units)  Date Value  10/15/2011 <40  07/16/2011 <40   CD4 (no units)  Date Value  10/15/2011 461  07/16/2011 421   CD4 T Cell Abs (/uL)  Date Value  03/20/2019 47 (L)  02/20/2019 <35 (L)  01/17/2019 <35 (L)     Problem List Items Addressed This Visit      High   Human immunodeficiency virus (HIV) disease ([place])    Her infection is coming under control since restarting therapy 3 months ago.  She will continue Tivicay and Symtuza and follow-up after lab work in 3 months.      Relevant Orders    T-helper cell (CD4)- ([facility] clinic only)   HIV-1 RNA quant-no reflex-bld   CBC   Comprehensive metabolic panel     Unprioritized   Unintentional weight loss    She is regaining her lost weight now that her virus is suppressing.      Hypoxia    She was hypoxic when admitted to the hospital in April.  She had diffuse bilateral infiltrates on CT scan.  It appears that it was not entirely clear whether  or not she had pneumonia, heart failure or both.  This process has now resolved.  She no longer needs her supplemental oxygen as her room air O2 sat while walking today was 100%.           Michel Bickers, MD [facility] for [facility] [place] [facility] [phone] pager   [phone] cell 04/18/2019, 11:15 AM

## 2019-04-18 NOTE — Assessment & Plan Note (Signed)
She was hypoxic when admitted to the hospital in April.  She had diffuse bilateral infiltrates on CT scan.  It appears that it was not entirely clear whether or not she had pneumonia, heart failure or both.  This process has now resolved.  She no longer needs her supplemental oxygen as her room air O2 sat while walking today was 100%.

## 2019-04-18 NOTE — Assessment & Plan Note (Signed)
She is regaining her lost weight now that her virus is suppressing.

## 2019-05-15 MED FILL — FERROUS SULFATE 325 MG TAB: 325 (65 FE) | 30 days supply | Qty: 30 | Fill #2

## 2019-05-15 MED FILL — TIVICAY 50 MG TABLET: 50 | 30 days supply | Qty: 30 | Fill #0

## 2019-05-15 MED FILL — SYMTUZA 800-150-200-10 MG T: 800-150-200 | 30 days supply | Qty: 30 | Fill #0

## 2019-05-15 MED FILL — SULFAMETHOXAZOLE-TMP DS TAB: 800-160 | 30 days supply | Qty: 30 | Fill #3

## 2019-05-18 ENCOUNTER — Telehealth: Payer: Self-pay | Admitting: *Deleted

## 2019-05-18 NOTE — Telephone Encounter (Addendum)
RN contacted the patient today. Purpose of the call is to stay connected with the patient. RN left a message stating that I wanted to be sure all is well and to please let me know if I can assist in any way. Return number left for the patient at this time.   PATIENT ON HOLD  Plan of Care orders have expired effective 05/17/19 but GOALS have not been completely meet at this time. I would like to connect with the patient and received further orders from MD. If I am unable to get in contact with her within the next 30 days I will have to discharge at that time. RN WILL NOT RESUME CARE UNTIL NEW MD ORDERS OBTAINED AND PATIENT ABLE/WILLING TO RE-ENGAGE IN CARE

## 2019-05-24 ENCOUNTER — Telehealth: Payer: Self-pay | Admitting: Internal Medicine

## 2019-05-24 NOTE — Telephone Encounter (Signed)
Sales Rep for [employer] came into office to drop off CMN for pt's oxygen to clarify that Medicaid will pay for pt's oxygen. Pt's last visit in office was on 01-26-19, and forms were placed in red team folder.

## 2019-06-14 MED FILL — TIVICAY 50 MG TABLET: 50 | 30 days supply | Qty: 30 | Fill #1

## 2019-06-14 MED FILL — FERROUS SULFATE 325 MG TAB: 325 (65 FE) | 30 days supply | Qty: 30 | Fill #3

## 2019-06-14 MED FILL — SYMTUZA 800-150-200-10 MG T: 800-150-200 | 30 days supply | Qty: 30 | Fill #1

## 2019-06-14 MED FILL — SULFAMETHOXAZOLE-TMP DS TAB: 800-160 | 30 days supply | Qty: 30 | Fill #4

## 2019-07-03 ENCOUNTER — Telehealth: Payer: Self-pay | Admitting: Internal Medicine

## 2019-07-03 NOTE — Telephone Encounter (Signed)
[employer] dropping off forms to be signed for the oxygen that was ordered in the hospital placed in the red folder at the front desk. jw

## 2019-07-04 NOTE — Telephone Encounter (Signed)
Spoke to Dr. Erin Hearing who said he would review this patient's chart since he saw patient in ED May 2020.  Alison Ruiz, [place]

## 2019-07-05 ENCOUNTER — Other Ambulatory Visit: Payer: Self-pay

## 2019-07-05 ENCOUNTER — Other Ambulatory Visit: Payer: Medicare Other

## 2019-07-05 DIAGNOSIS — B20 Human immunodeficiency virus [HIV] disease: Secondary | ICD-10-CM

## 2019-07-06 LAB — T-HELPER CELL (CD4) - (RCID CLINIC ONLY)
CD4 % Helper T Cell: 5 % — ABNORMAL LOW (ref 33–65)
CD4 T Cell Abs: 55 /uL — ABNORMAL LOW (ref 400–1790)

## 2019-07-06 NOTE — Telephone Encounter (Signed)
Faxed form to [phone], per request.

## 2019-07-10 LAB — CBC
HCT: 34.1 % — ABNORMAL LOW (ref 35.0–45.0)
Hemoglobin: 10.9 g/dL — ABNORMAL LOW (ref 11.7–15.5)
MCH: 30.7 pg (ref 27.0–33.0)
MCHC: 32 g/dL (ref 32.0–36.0)
MCV: 96.1 fL (ref 80.0–100.0)
MPV: 10.1 fL (ref 7.5–12.5)
Platelets: 224 10*3/uL (ref 140–400)
RBC: 3.55 10*6/uL — ABNORMAL LOW (ref 3.80–5.10)
RDW: 13.1 % (ref 11.0–15.0)
WBC: 4.2 10*3/uL (ref 3.8–10.8)

## 2019-07-10 LAB — COMPREHENSIVE METABOLIC PANEL
AG Ratio: 1.1 (calc) (ref 1.0–2.5)
ALT: 26 U/L (ref 6–29)
AST: 40 U/L — ABNORMAL HIGH (ref 10–35)
Albumin: 4 g/dL (ref 3.6–5.1)
Alkaline phosphatase (APISO): 93 U/L (ref 37–153)
BUN/Creatinine Ratio: 18 (calc) (ref 6–22)
BUN: 17 mg/dL (ref 7–25)
CO2: 25 mmol/L (ref 20–32)
Calcium: 10 mg/dL (ref 8.6–10.4)
Chloride: 108 mmol/L (ref 98–110)
Creat: 0.95 mg/dL — ABNORMAL HIGH (ref 0.60–0.88)
Globulin: 3.5 g/dL (calc) (ref 1.9–3.7)
Glucose, Bld: 106 mg/dL — ABNORMAL HIGH (ref 65–99)
Potassium: 4.3 mmol/L (ref 3.5–5.3)
Sodium: 144 mmol/L (ref 135–146)
Total Bilirubin: 0.3 mg/dL (ref 0.2–1.2)
Total Protein: 7.5 g/dL (ref 6.1–8.1)

## 2019-07-10 LAB — HIV-1 RNA QUANT-NO REFLEX-BLD
HIV 1 RNA Quant: 103 copies/mL — ABNORMAL HIGH
HIV-1 RNA Quant, Log: 2.01 Log copies/mL — ABNORMAL HIGH

## 2019-07-12 MED FILL — FERROUS SULFATE 325 MG TAB: 325 (65 FE) | 30 days supply | Qty: 30 | Fill #4

## 2019-07-12 MED FILL — SYMTUZA 800-150-200-10 MG T: 800-150-200 | 30 days supply | Qty: 30 | Fill #2

## 2019-07-12 MED FILL — SULFAMETHOXAZOLE-TMP DS TAB: 800-160 | 30 days supply | Qty: 30 | Fill #5

## 2019-07-12 MED FILL — TIVICAY 50 MG TABLET: 50 | 30 days supply | Qty: 30 | Fill #2

## 2019-07-17 ENCOUNTER — Telehealth: Payer: Self-pay | Admitting: *Deleted

## 2019-07-17 NOTE — Telephone Encounter (Signed)
Call placed with message left stating my name and that I was calling from Dr. Hale Bogus office. I asked the patient to please return my call. I would like to be sure she is doing well and if not offer assistance.

## 2019-07-19 ENCOUNTER — Ambulatory Visit (INDEPENDENT_AMBULATORY_CARE_PROVIDER_SITE_OTHER): Payer: Medicare Other | Admitting: Internal Medicine

## 2019-07-19 ENCOUNTER — Encounter: Payer: Self-pay | Admitting: Internal Medicine

## 2019-07-19 ENCOUNTER — Other Ambulatory Visit: Payer: Self-pay

## 2019-07-19 DIAGNOSIS — B20 Human immunodeficiency virus [HIV] disease: Secondary | ICD-10-CM | POA: Diagnosis not present

## 2019-07-19 DIAGNOSIS — R634 Abnormal weight loss: Secondary | ICD-10-CM | POA: Diagnosis not present

## 2019-07-19 DIAGNOSIS — Z23 Encounter for immunization: Secondary | ICD-10-CM | POA: Diagnosis not present

## 2019-07-19 NOTE — Assessment & Plan Note (Signed)
Her infection has come under much better control and she is beginning to have some CD4 reconstitution since restarting antiretroviral therapy 6 months ago.  She received her influenza vaccine today.  She will continue Symtuza and Trimethoprim/Sulfamethoxazole and follow-up in 3 months.

## 2019-07-19 NOTE — Assessment & Plan Note (Signed)
Her weight loss is resolving as her HIV infection comes under better control.

## 2019-07-19 NOTE — Progress Notes (Signed)
Patient Active Problem List   Diagnosis Date Noted  . Human immunodeficiency virus (HIV) disease ([place]) 02/25/2007    Priority: High  . ANXIETY 04/07/2007    Priority: Medium  . Normocytic anemia 01/25/2019  . Protein-calorie malnutrition, severe 01/19/2019  . Abnormal serum level of alkaline phosphatase 01/18/2019  . Left bundle branch block (LBBB) on electrocardiogram 01/18/2019  . AKI (acute kidney injury) ([place]) 01/18/2019  . Bradycardia   . Unintentional weight loss 07/05/2017  . Dyslipidemia 11/12/2011  . Depression 04/07/2007  . Dental caries 04/07/2007  . HEPATITIS B, HX OF 04/07/2007  . SYNCOPE, HX OF 04/07/2007    Patient's Medications  New Prescriptions   No medications on file  Previous Medications   DARUNAVIR-COBICISCTAT-EMTRICITABINE-TENOFOVIR ALAFENAMIDE (SYMTUZA) 800-150-200-10 MG TABS    Take 1 tablet by mouth daily with breakfast.   DOLUTEGRAVIR (TIVICAY) 50 MG TABLET    Take 1 tablet (50 mg total) by mouth daily.   FEEDING SUPPLEMENT, ENSURE ENLIVE, (ENSURE ENLIVE) LIQD    Take 237 mLs by mouth 3 (three) times daily between meals.   FERROUS SULFATE 325 (65 FE) MG TABLET    Take 1 tablet (325 mg total) by mouth daily for 30 days.   SULFAMETHOXAZOLE-TRIMETHOPRIM (BACTRIM DS) 800-160 MG TABLET    Take 1 tablet by mouth daily.  Modified Medications   No medications on file  Discontinued Medications   No medications on file    Subjective: Alison Ruiz is in for her routine HIV follow-up visit.  She has not had any problems obtaining, taking or tolerating her Symtuza.  Her niece brought her a pill cutter and she uses it to cut her Symtuza and Trimethoprim/Sulfamethoxazole in half.  She takes her medications at 10 AM.  She does not recall missing any doses.  She is feeling much better and much stronger.  Her appetite has improved and she is gaining weight.  Review of Systems: Review of Systems  Constitutional: Negative for chills, diaphoresis, fever,  malaise/fatigue and weight loss.  HENT: Negative for sore throat.   Respiratory: Negative for cough, sputum production and shortness of breath.   Cardiovascular: Negative for chest pain.  Gastrointestinal: Negative for abdominal pain, diarrhea, heartburn, nausea and vomiting.  Genitourinary: Negative for dysuria and frequency.  Musculoskeletal: Negative for joint pain and myalgias.  Skin: Negative for rash.  Neurological: Negative for dizziness and headaches.  Psychiatric/Behavioral: Negative for depression and substance abuse. The patient is not nervous/anxious.     Past Medical History:  Diagnosis Date  . Abnormal serum level of alkaline phosphatase 01/18/2019  . AKI (acute kidney injury) ([place]) 12/2018  . GRIEF REACTION, ACUTE 05/24/2008   Annotation: after brother's death October 06, 2022 Qualifier: Diagnosis of  By: Alison Ruiz, Alison Ruiz    . HIV (human immunodeficiency virus infection) ([place])   . Loss of consciousness ([place]) 12/2018    Social History   Tobacco Use  . Smoking status: Never Smoker  . Smokeless tobacco: Never Used  Substance Use Topics  . Alcohol use: No  . Drug use: No    No family history on file.  No Known Allergies  Health Maintenance  Topic Date Due  . TETANUS/TDAP  08[birth year]09/1950  . DEXA SCAN  08/09/1998  . PNA vac Low Risk Adult (2 of 2 - PCV13) 04/06/2008  . INFLUENZA VACCINE  04/22/2019    Objective:  Vitals:   07/19/19 1351  BP: 125/80  Pulse: 71  Temp: 97.7 F (36.5 C)  TempSrc: Oral  Weight: 88 lb (39.9 kg)   Body mass index is 19.04 kg/m.  Physical Exam Constitutional:      Comments: She has gained 15 pounds since April.  HENT:     Mouth/Throat:     Pharynx: No oropharyngeal exudate.  Eyes:     Conjunctiva/sclera: Conjunctivae normal.  Cardiovascular:     Rate and Rhythm: Normal rate and regular rhythm.     Heart sounds: Murmur present.     Comments: Early 1/6 systolic murmur heard best at the right upper sternal border. Pulmonary:      Breath sounds: Normal breath sounds.  Abdominal:     Palpations: Abdomen is soft. There is no mass.     Tenderness: There is no abdominal tenderness.  Musculoskeletal: Normal range of motion.        General: No swelling or tenderness.  Skin:    Findings: No rash.  Neurological:     Mental Status: She is alert and oriented to person, place, and time.  Psychiatric:        Mood and Affect: Mood normal.     Lab Results Lab Results  Component Value Date   WBC 4.2 07/05/2019   HGB 10.9 (L) 07/05/2019   HCT 34.1 (L) 07/05/2019   MCV 96.1 07/05/2019   PLT 224 07/05/2019    Lab Results  Component Value Date   CREATININE 0.95 (H) 07/05/2019   BUN 17 07/05/2019   NA 144 07/05/2019   K 4.3 07/05/2019   CL 108 07/05/2019   CO2 25 07/05/2019    Lab Results  Component Value Date   ALT 26 07/05/2019   AST 40 (H) 07/05/2019   ALKPHOS 265 (H) 01/18/2019   BILITOT 0.3 07/05/2019    Lab Results  Component Value Date   CHOL 161 12/29/2012   HDL 63 12/29/2012   LDLCALC 86 12/29/2012   TRIG 58 12/29/2012   CHOLHDL 2.6 12/29/2012   Lab Results  Component Value Date   LABRPR NON REAC 12/29/2012   HIV 1 RNA Quant (copies/mL)  Date Value  07/05/2019 103 (H)  03/20/2019 349 (H)  02/20/2019 812 (H)   HIV-1 RNA Viral Load (no units)  Date Value  10/15/2011 <40  07/16/2011 <40   CD4 (no units)  Date Value  10/15/2011 461  07/16/2011 421   CD4 T Cell Abs (/uL)  Date Value  07/05/2019 55 (L)  03/20/2019 47 (L)  02/20/2019 <35 (L)     Problem List Items Addressed This Visit      High   Human immunodeficiency virus (HIV) disease ([name])    Her infection has come under much better control and she is beginning to have some CD4 reconstitution since restarting antiretroviral therapy 6 months ago.  She received her influenza vaccine today.  She will continue Symtuza and Trimethoprim/Sulfamethoxazole and follow-up in 3 months.        Unprioritized   Unintentional weight loss     Her weight loss is resolving as her HIV infection comes under better control.           Alison Bickers, [facility] for [facility] [place] [facility] [phone] pager   [phone] cell 07/19/2019, 2:05 PM

## 2019-07-23 ENCOUNTER — Telehealth: Payer: Self-pay | Admitting: *Deleted

## 2019-07-23 NOTE — Telephone Encounter (Signed)
The intent of this communication is to inform the Health Care Team that this patient will be discharged from [facility]).  Greater than 3 attempts have been made to re-engage the patient  without any success I am more that happy to reopen when the patient is ready to discuss medication adherence and HIV disease  management. Effective 07/23/19 patient will be discharged and removed from [facility] active patient listing.

## 2019-07-24 ENCOUNTER — Encounter: Payer: Self-pay | Admitting: General Practice

## 2019-08-06 ENCOUNTER — Encounter (HOSPITAL_COMMUNITY): Payer: Self-pay | Admitting: Emergency Medicine

## 2019-08-06 ENCOUNTER — Inpatient Hospital Stay (HOSPITAL_COMMUNITY)
Admission: EM | Admit: 2019-08-06 | Discharge: 2019-08-22 | DRG: 329 | Disposition: E | Payer: Medicare Other | Attending: Critical Care Medicine | Admitting: Critical Care Medicine

## 2019-08-06 ENCOUNTER — Emergency Department (HOSPITAL_COMMUNITY): Payer: Medicare Other

## 2019-08-06 ENCOUNTER — Other Ambulatory Visit: Payer: Self-pay

## 2019-08-06 DIAGNOSIS — K56609 Unspecified intestinal obstruction, unspecified as to partial versus complete obstruction: Secondary | ICD-10-CM | POA: Diagnosis present

## 2019-08-06 DIAGNOSIS — Z515 Encounter for palliative care: Secondary | ICD-10-CM | POA: Diagnosis present

## 2019-08-06 DIAGNOSIS — D696 Thrombocytopenia, unspecified: Secondary | ICD-10-CM | POA: Diagnosis present

## 2019-08-06 DIAGNOSIS — K56601 Complete intestinal obstruction, unspecified as to cause: Secondary | ICD-10-CM | POA: Diagnosis present

## 2019-08-06 DIAGNOSIS — C186 Malignant neoplasm of descending colon: Principal | ICD-10-CM | POA: Diagnosis present

## 2019-08-06 DIAGNOSIS — Z20828 Contact with and (suspected) exposure to other viral communicable diseases: Secondary | ICD-10-CM | POA: Diagnosis present

## 2019-08-06 DIAGNOSIS — E785 Hyperlipidemia, unspecified: Secondary | ICD-10-CM | POA: Diagnosis present

## 2019-08-06 DIAGNOSIS — F411 Generalized anxiety disorder: Secondary | ICD-10-CM | POA: Diagnosis present

## 2019-08-06 DIAGNOSIS — E860 Dehydration: Secondary | ICD-10-CM | POA: Diagnosis present

## 2019-08-06 DIAGNOSIS — N179 Acute kidney failure, unspecified: Secondary | ICD-10-CM | POA: Diagnosis present

## 2019-08-06 DIAGNOSIS — F329 Major depressive disorder, single episode, unspecified: Secondary | ICD-10-CM | POA: Diagnosis present

## 2019-08-06 DIAGNOSIS — E872 Acidosis: Secondary | ICD-10-CM | POA: Diagnosis present

## 2019-08-06 DIAGNOSIS — R6521 Severe sepsis with septic shock: Secondary | ICD-10-CM | POA: Diagnosis not present

## 2019-08-06 DIAGNOSIS — Z9049 Acquired absence of other specified parts of digestive tract: Secondary | ICD-10-CM

## 2019-08-06 DIAGNOSIS — J95822 Acute and chronic postprocedural respiratory failure: Secondary | ICD-10-CM | POA: Diagnosis not present

## 2019-08-06 DIAGNOSIS — Z681 Body mass index (BMI) 19 or less, adult: Secondary | ICD-10-CM

## 2019-08-06 DIAGNOSIS — J9601 Acute respiratory failure with hypoxia: Secondary | ICD-10-CM | POA: Diagnosis not present

## 2019-08-06 DIAGNOSIS — J9600 Acute respiratory failure, unspecified whether with hypoxia or hypercapnia: Secondary | ICD-10-CM

## 2019-08-06 DIAGNOSIS — Z66 Do not resuscitate: Secondary | ICD-10-CM | POA: Diagnosis present

## 2019-08-06 DIAGNOSIS — D62 Acute posthemorrhagic anemia: Secondary | ICD-10-CM | POA: Diagnosis not present

## 2019-08-06 DIAGNOSIS — E43 Unspecified severe protein-calorie malnutrition: Secondary | ICD-10-CM | POA: Diagnosis present

## 2019-08-06 DIAGNOSIS — Z79899 Other long term (current) drug therapy: Secondary | ICD-10-CM

## 2019-08-06 DIAGNOSIS — Z7189 Other specified counseling: Secondary | ICD-10-CM

## 2019-08-06 DIAGNOSIS — Z21 Asymptomatic human immunodeficiency virus [HIV] infection status: Secondary | ICD-10-CM | POA: Diagnosis present

## 2019-08-06 DIAGNOSIS — E875 Hyperkalemia: Secondary | ICD-10-CM | POA: Diagnosis not present

## 2019-08-06 DIAGNOSIS — E162 Hypoglycemia, unspecified: Secondary | ICD-10-CM | POA: Diagnosis not present

## 2019-08-06 DIAGNOSIS — A419 Sepsis, unspecified organism: Secondary | ICD-10-CM | POA: Diagnosis not present

## 2019-08-06 DIAGNOSIS — R739 Hyperglycemia, unspecified: Secondary | ICD-10-CM | POA: Diagnosis not present

## 2019-08-06 DIAGNOSIS — B20 Human immunodeficiency virus [HIV] disease: Secondary | ICD-10-CM | POA: Diagnosis present

## 2019-08-06 DIAGNOSIS — K56691 Other complete intestinal obstruction: Secondary | ICD-10-CM

## 2019-08-06 DIAGNOSIS — Z9911 Dependence on respirator [ventilator] status: Secondary | ICD-10-CM | POA: Diagnosis not present

## 2019-08-06 DIAGNOSIS — E870 Hyperosmolality and hypernatremia: Secondary | ICD-10-CM | POA: Diagnosis not present

## 2019-08-06 DIAGNOSIS — Z978 Presence of other specified devices: Secondary | ICD-10-CM

## 2019-08-06 DIAGNOSIS — J9690 Respiratory failure, unspecified, unspecified whether with hypoxia or hypercapnia: Secondary | ICD-10-CM

## 2019-08-06 LAB — URINALYSIS, ROUTINE W REFLEX MICROSCOPIC
Bilirubin Urine: NEGATIVE
Glucose, UA: NEGATIVE mg/dL
Hgb urine dipstick: NEGATIVE
Ketones, ur: NEGATIVE mg/dL
Leukocytes,Ua: NEGATIVE
Nitrite: NEGATIVE
Protein, ur: NEGATIVE mg/dL
Specific Gravity, Urine: 1.021 (ref 1.005–1.030)
pH: 5 (ref 5.0–8.0)

## 2019-08-06 LAB — COMPREHENSIVE METABOLIC PANEL
ALT: 14 U/L (ref 0–44)
AST: 19 U/L (ref 15–41)
Albumin: 3.7 g/dL (ref 3.5–5.0)
Alkaline Phosphatase: 67 U/L (ref 38–126)
Anion gap: 20 — ABNORMAL HIGH (ref 5–15)
BUN: 52 mg/dL — ABNORMAL HIGH (ref 8–23)
CO2: 18 mmol/L — ABNORMAL LOW (ref 22–32)
Calcium: 9.1 mg/dL (ref 8.9–10.3)
Chloride: 103 mmol/L (ref 98–111)
Creatinine, Ser: 1.85 mg/dL — ABNORMAL HIGH (ref 0.44–1.00)
GFR calc Af Amer: 28 mL/min — ABNORMAL LOW (ref >60)
GFR calc non Af Amer: 24 mL/min — ABNORMAL LOW (ref >60)
Glucose, Bld: 149 mg/dL — ABNORMAL HIGH (ref 70–99)
Potassium: 3.7 mmol/L (ref 3.5–5.1)
Sodium: 141 mmol/L (ref 135–145)
Total Bilirubin: 1.6 mg/dL — ABNORMAL HIGH (ref 0.3–1.2)
Total Protein: 7.7 g/dL (ref 6.5–8.1)

## 2019-08-06 LAB — CBC
HCT: 39.7 % (ref 36.0–46.0)
Hemoglobin: 13.4 g/dL (ref 12.0–15.0)
MCH: 31.1 pg (ref 26.0–34.0)
MCHC: 33.8 g/dL (ref 30.0–36.0)
MCV: 92.1 fL (ref 80.0–100.0)
Platelets: 256 10*3/uL (ref 150–400)
RBC: 4.31 MIL/uL (ref 3.87–5.11)
RDW: 14 % (ref 11.5–15.5)
WBC: 4.1 10*3/uL (ref 4.0–10.5)
nRBC: 0 % (ref 0.0–0.2)

## 2019-08-06 LAB — LACTIC ACID, PLASMA: Lactic Acid, Venous: 3.2 mmol/L (ref 0.5–1.9)

## 2019-08-06 LAB — LIPASE, BLOOD: Lipase: 13 U/L (ref 11–51)

## 2019-08-06 MED ORDER — KCL IN DEXTROSE-NACL 20-5-0.9 MEQ/L-%-% IV SOLN
INTRAVENOUS | Status: DC
  Administered 2019-08-07: 01:00:00 via INTRAVENOUS
  Filled 2019-08-06: qty 1000

## 2019-08-06 MED ORDER — MORPHINE SULFATE (PF) 2 MG/ML IV SOLN
2.0000 mg | INTRAVENOUS | Status: DC | PRN
  Administered 2019-08-07: 2 mg via INTRAVENOUS
  Filled 2019-08-06: qty 1

## 2019-08-06 MED ORDER — SODIUM CHLORIDE 0.9 % IV BOLUS
500.0000 mL | Freq: Once | INTRAVENOUS | Status: AC
  Administered 2019-08-06: 500 mL via INTRAVENOUS

## 2019-08-06 MED ORDER — ONDANSETRON HCL 4 MG/2ML IJ SOLN
4.0000 mg | Freq: Four times a day (QID) | INTRAMUSCULAR | Status: DC | PRN
  Administered 2019-08-07: 4 mg via INTRAVENOUS
  Filled 2019-08-06: qty 2

## 2019-08-06 MED ORDER — ONDANSETRON HCL 4 MG PO TABS: 4.0000 mg | ORAL_TABLET | Freq: Four times a day (QID) | ORAL | Status: DC | PRN

## 2019-08-06 MED ORDER — HEPARIN SODIUM (PORCINE) 5000 UNIT/ML IJ SOLN
5000.0000 [IU] | Freq: Three times a day (TID) | INTRAMUSCULAR | Status: DC
  Administered 2019-08-07 – 2019-08-08 (×3): 5000 [IU] via SUBCUTANEOUS
  Filled 2019-08-06 (×5): qty 1

## 2019-08-06 MED ORDER — SODIUM CHLORIDE 0.9% FLUSH
3.0000 mL | Freq: Once | INTRAVENOUS | Status: AC
  Administered 2019-08-06: 19:00:00 3 mL via INTRAVENOUS

## 2019-08-06 NOTE — ED Triage Notes (Signed)
C/o generalized abd pain since yesterday with nausea.

## 2019-08-06 NOTE — Consult Note (Signed)
Reason for Consult:abd pain Referring Physician: Dr Raina Mina Newvine is an 85 y.o. female.  HPI: 60 yof with history HIV who lives alone in [place] presents with five days of abd pain, no flatus or bm in over two days, having emesis, not eating. This was not getting better at home and she presented to er. Nothing was helping.  She was evaluated and appears to have a large bowel obstruction by ct scan.   Past Medical History:  Diagnosis Date  . Abnormal serum level of alkaline phosphatase 01/18/2019  . AKI (acute kidney injury) ([place]) 12/2018  . GRIEF REACTION, ACUTE 05/24/2008   Annotation: after brother's death 2022-10-16 Qualifier: Diagnosis of  By: Megan Salon MD, John    . HIV (human immunodeficiency virus infection) ([place])   . Loss of consciousness ([place]) 12/2018    Past Surgical History:  Procedure Laterality Date  . ABDOMINAL HYSTERECTOMY      No family history on file.  Social History:  reports that she has never smoked. She has never used smokeless tobacco. She reports that she does not drink alcohol or use drugs.  Allergies: No Known Allergies  Medications: I have reviewed the patient's current medications.  Results for orders placed or performed during the hospital encounter of 08/19/2019 (from the past 48 hour(s))  Lipase, blood     Status: None   Collection Time: 08/07/2019  5:19 PM  Result Value Ref Range   Lipase 13 11 - 51 U/L    Comment: Performed at [facility], [place] [address], [place], [place] [address]  Comprehensive metabolic panel     Status: Abnormal   Collection Time: 07/30/2019  5:19 PM  Result Value Ref Range   Sodium 141 135 - 145 mmol/L   Potassium 3.7 3.5 - 5.1 mmol/L   Chloride 103 98 - 111 mmol/L   CO2 18 (L) 22 - 32 mmol/L   Glucose, Bld 149 (H) 70 - 99 mg/dL   BUN 52 (H) 8 - 23 mg/dL   Creatinine, Ser 1.85 (H) 0.44 - 1.00 mg/dL   Calcium 9.1 8.9 - 10.3 mg/dL   Total Protein 7.7 6.5 - 8.1 g/dL   Albumin 3.7 3.5 - 5.0 g/dL   AST 19 15 - 41 U/L    ALT 14 0 - 44 U/L   Alkaline Phosphatase 67 38 - 126 U/L   Total Bilirubin 1.6 (H) 0.3 - 1.2 mg/dL   GFR calc non Af Amer 24 (L) >60 mL/min   GFR calc Af Amer 28 (L) >60 mL/min   Anion gap 20 (H) 5 - 15    Comment: Performed at [facility], [place] [address] [place], [place] [address]  CBC     Status: None   Collection Time: 07/25/2019  5:19 PM  Result Value Ref Range   WBC 4.1 4.0 - 10.5 K/uL   RBC 4.31 3.87 - 5.11 MIL/uL   Hemoglobin 13.4 12.0 - 15.0 g/dL   HCT 39.7 36.0 - 46.0 %   MCV 92.1 80.0 - 100.0 fL   MCH 31.1 26.0 - 34.0 pg   MCHC 33.8 30.0 - 36.0 g/dL   RDW 14.0 11.5 - 15.5 %   Platelets 256 150 - 400 K/uL   nRBC 0.0 0.0 - 0.2 %    Comment: Performed at [facility], [place] [address] [place], [place] [address]  Lactic acid, plasma     Status: Abnormal   Collection Time: 07/31/2019  6:30 PM  Result Value Ref Range   Lactic Acid, Venous 3.2 (HH) 0.5 - 1.9 mmol/L    Comment: CRITICAL RESULT CALLED TO, READ BACK BY AND VERIFIED WITH: RN Alesia Banda AT 1936 08/05/2019 BY L BENFIELD Performed at [facility], [place] [address] [place], [place] [address]   Urinalysis, Routine w reflex microscopic     Status: Abnormal   Collection Time: 08/14/2019  9:21 PM  Result Value Ref Range   Color, Urine AMBER (A) YELLOW    Comment: BIOCHEMICALS MAY BE AFFECTED BY COLOR   APPearance HAZY (A) CLEAR   Specific Gravity, Urine 1.021 1.005 - 1.030   pH 5.0 5.0 - 8.0   Glucose, UA NEGATIVE NEGATIVE mg/dL   Hgb urine dipstick NEGATIVE NEGATIVE   Bilirubin Urine NEGATIVE NEGATIVE   Ketones, ur NEGATIVE NEGATIVE mg/dL   Protein, ur NEGATIVE NEGATIVE mg/dL   Nitrite NEGATIVE NEGATIVE   Leukocytes,Ua NEGATIVE NEGATIVE    Comment: Performed at [place] [address] [place], [place] [address]    Ct Abdomen Pelvis Wo Contrast  Result Date: 07/30/2019 CLINICAL DATA:  85 year old female with generalized abdominal pain and nausea since yesterday. EXAM: CT ABDOMEN AND  PELVIS WITHOUT CONTRAST TECHNIQUE: Multidetector CT imaging of the abdomen and pelvis was performed following the standard protocol without IV contrast. COMPARISON:  Report of CT Abdomen and Pelvis 05/24/2001 (no images available). FINDINGS: Lower chest: Negative; minimal right lung base atelectasis or scarring. No cardiomegaly, pericardial or pleural effusion. Hepatobiliary: Possible small round nonspecific 25 millimeter hypodense lesion of the lateral left hepatic lobe on coronal image 41. Otherwise negative noncontrast liver and gallbladder. Pancreas: Difficult to delineate, grossly negative. Spleen: Diminutive, negative. Adrenals/Urinary Tract: Adrenal glands are difficult to identified. Bilateral nephrolithiasis including a bulky 8-9 millimeter left upper pole calculus. But no hydronephrosis or perinephric stranding. The ureters are difficult to delineate. The urinary bladder seems to be completely decompressed. Stomach/Bowel: Diffusely fluid-filled dilated bowel loops throughout the abdomen and pelvis. Evidence of a decompressed rectum and a decompressed sigmoid colon along the left pelvic sidewall. In the left lower quadrant there is a small volume of what appears to be large bowel gas at the junction of the descending and sigmoid colon (coronal image 59). However, upstream of this the descending colon is indistinct and then the splenic flexure is dilated and fluid-filled (series 3, image 18). Dilated mostly fluid containing bowel loops upstream from that point all the way towards the proximal jejunum. Both the stomach and duodenum seem to be decompressed. A small to moderate gastric hiatal hernia is suspected. No definite pneumoperitoneum.  No definite free fluid. Vascular/Lymphatic: Vascular patency is not evaluated in the absence of IV contrast. Extensive Aortoiliac calcified atherosclerosis. Reproductive: Not identified, probably surgically absent. Other: No definite pelvic free fluid. Musculoskeletal:  Osteopenia. No acute osseous abnormality identified. IMPRESSION: 1. High-grade bowel obstruction which appears to be at the level of the descending colon. The bowel in the region of the transition is poorly delineated in the absence of contrast, but in this age group consider an obstructing tumor. The rectum and sigmoid seem to be completely decompressed arguing against a distal large bowel volvulus. A repeat study with contrast would be valuable when feasible. 2. No definite free air or free fluid. 3. Suspected nonspecific 2.5 cm hypodense lesion in the left hepatic lobe. 4. Small hiatal hernia. Nephrolithiasis without obstructive uropathy. 5.  Aortic Atherosclerosis (ICD10-I70.0). Electronically Signed   By: Genevie Ann M.D.   On: 08/08/2019  19:57    Review of Systems  Respiratory: Negative for shortness of breath.   Cardiovascular: Negative for chest pain.  Gastrointestinal: Positive for abdominal pain, nausea and vomiting. Negative for blood in stool, constipation and diarrhea.  All other systems reviewed and are negative.  Blood pressure 126/74, pulse 96, temperature 97.9 F (36.6 C), temperature source Oral, resp. rate 20, SpO2 96 %. Physical Exam  Constitutional: She is oriented to person, place, and time. She appears cachectic. She is cooperative. She has a sickly appearance.  HENT:  Head: Normocephalic and atraumatic.  Right Ear: External ear normal.  Left Ear: External ear normal.  Mouth/Throat: Oropharynx is clear and moist.  Eyes: Pupils are equal, round, and reactive to light. No scleral icterus.  Neck: Neck supple.  Cardiovascular: Normal rate, regular rhythm and normal heart sounds.  Respiratory: Effort normal and breath sounds normal.  GI: She exhibits distension. Bowel sounds are decreased. There is no abdominal tenderness (she is distended but is nontender on exam). No hernia.  Neurological: She is alert and oriented to person, place, and time.  Skin: Skin is warm and dry.   Psychiatric: She has a normal mood and affect. Her behavior is normal.    Assessment/Plan: Likely large bowel obstruction -appears large bowel but has a lot of dilated small bowel as well.  She will need surgery for this almost certainly. She will get sars-cov2 now, ng tube to lwis, resuscitate with fluids.  Her abdomen is not tender and I think colicky pain will get better with ng and certainly make exploration easier with less distended bowel. I dont feel she needs to go urgently to or right now given exam, time course. I think lactate is up a little just from n/v.  I have discussed this plan with her and will reevaluate in the am. -she does understand likely surgery is colectomy and colostomy with significant risks. At this point she would like to proceed with surgery if indicated. -her sister an niece are her decision makers she states, their numbers are in chart  Rolm Bookbinder 08/02/2019, 9:32 PM

## 2019-08-06 NOTE — ED Notes (Addendum)
[name] [phone] niece Dot Hardison [phone] sister

## 2019-08-06 NOTE — ED Provider Notes (Signed)
[facility] EMERGENCY DEPARTMENT Provider Note   CSN: [id number] Arrival date & time: 08/20/2019  1702     History   Chief Complaint Chief Complaint  Patient presents with   Abdominal Pain    HPI Alison Ruiz is a 85 y.o. female.     HPI   Patient with multiple medical problems presents with abdominal pain for 2 days. There is associated nausea, no bowel movements.  Pain is severe, focally throughout the abdomen, with associated distention. Patient acknowledges her medical issues, states that each she takes her medication regularly, and was in her usual state of health till yesterday. Now, since yesterday she has had no appetite, due to severe pain, nausea.  No urinary complaints, no fever, no cough, no dyspnea.   Past Medical History:  Diagnosis Date   Abnormal serum level of alkaline phosphatase 01/18/2019   AKI (acute kidney injury) ([place]) 12/2018   GRIEF REACTION, ACUTE 05/24/2008   Annotation: after brother's death 10-19-22 Qualifier: Diagnosis of  By: Megan Salon MD, John     HIV (human immunodeficiency virus infection) [facility])    Loss of consciousness ([place]) 12/2018    Patient Active Problem List   Diagnosis Date Noted   SBO (small bowel obstruction) ([place]) 08/07/2019   Normocytic anemia 01/25/2019   Protein-calorie malnutrition, severe 01/19/2019   Abnormal serum level of alkaline phosphatase 01/18/2019   Left bundle branch block (LBBB) on electrocardiogram 01/18/2019   AKI (acute kidney injury) ([place]) 01/18/2019   Bradycardia    Unintentional weight loss 07/05/2017   Dyslipidemia 11/12/2011   Anxiety state 04/07/2007   Depression 04/07/2007   Dental caries 04/07/2007   HEPATITIS B, HX OF 04/07/2007   SYNCOPE, HX OF 04/07/2007   Human immunodeficiency virus (HIV) disease ([place]) 02/25/2007    Past Surgical History:  Procedure Laterality Date   ABDOMINAL HYSTERECTOMY       OB History   No obstetric history on file.       Home Medications    Prior to Admission medications   Medication Sig Start Date End Date Taking? Authorizing Provider  acetaminophen (TYLENOL) 325 MG tablet Take 325 mg by mouth every 6 (six) hours as needed (for pain).    Yes [provider]  Darunavir-Cobicisctat-Emtricitabine-Tenofovir Alafenamide (SYMTUZA) 800-150-200-10 MG TABS Take 1 tablet by mouth daily with breakfast. 02/20/19  Yes Michel Bickers, MD  dolutegravir (TIVICAY) 50 MG tablet Take 1 tablet (50 mg total) by mouth daily. 02/20/19  Yes Michel Bickers, MD  ferrous sulfate 325 (65 FE) MG tablet Take 1 tablet (325 mg total) by mouth daily for 30 days. 02/20/19 08/02/2019 Yes Michel Bickers, MD  ondansetron (ZOFRAN) 4 MG tablet Take 4 mg by mouth every 8 (eight) hours as needed for nausea or vomiting.   Yes [provider]  sulfamethoxazole-trimethoprim (BACTRIM DS) 800-160 MG tablet Take 1 tablet by mouth daily. 02/20/19  Yes Michel Bickers, MD  feeding supplement, ENSURE ENLIVE, (ENSURE ENLIVE) LIQD Take 237 mLs by mouth 3 (three) times daily between meals. Patient taking differently: Take 1 Bottle by mouth daily.  01/20/19   Matilde Haymaker, MD    Family History No family history on file.  Social History Social History   Tobacco Use   Smoking status: Never Smoker   Smokeless tobacco: Never Used  Substance Use Topics   Alcohol use: No   Drug use: No     Allergies   Patient has no known allergies.   Review of Systems Review of Systems  Constitutional:       Per HPI, otherwise negative  HENT:       Per HPI, otherwise negative  Respiratory:       Per HPI, otherwise negative  Cardiovascular:       Per HPI, otherwise negative  Gastrointestinal: Positive for abdominal pain, nausea and vomiting.  Endocrine:       Negative aside from HPI  Genitourinary:       Neg aside from HPI   Musculoskeletal:       Per HPI, otherwise negative  Skin: Negative.   Allergic/Immunologic: Positive for  immunocompromised state.  Neurological: Positive for weakness. Negative for syncope.     Physical Exam Updated Vital Signs BP 126/74    Pulse 96    Temp 97.9 F (36.6 C) (Oral)    Resp 20    SpO2 96%   Physical Exam Vitals signs and nursing note reviewed.  Constitutional:      General: She is not in acute distress.    Appearance: She is well-developed.  HENT:     Head: Normocephalic and atraumatic.  Eyes:     Conjunctiva/sclera: Conjunctivae normal.  Cardiovascular:     Rate and Rhythm: Normal rate and regular rhythm.  Pulmonary:     Effort: Pulmonary effort is normal. No respiratory distress.     Breath sounds: Normal breath sounds. No stridor.  Abdominal:     General: There is no distension.  Skin:    General: Skin is warm and dry.  Neurological:     Mental Status: She is alert and oriented to person, place, and time.     Cranial Nerves: No cranial nerve deficit.      ED Treatments / Results  Labs (all labs ordered are listed, but only abnormal results are displayed) Labs Reviewed  COMPREHENSIVE METABOLIC PANEL - Abnormal; Notable for the following components:      Result Value   CO2 18 (*)    Glucose, Bld 149 (*)    BUN 52 (*)    Creatinine, Ser 1.85 (*)    Total Bilirubin 1.6 (*)    GFR calc non Af Amer 24 (*)    GFR calc Af Amer 28 (*)    Anion gap 20 (*)    All other components within normal limits  URINALYSIS, ROUTINE W REFLEX MICROSCOPIC - Abnormal; Notable for the following components:   Color, Urine AMBER (*)    APPearance HAZY (*)    All other components within normal limits  LACTIC ACID, PLASMA - Abnormal; Notable for the following components:   Lactic Acid, Venous 3.2 (*)    All other components within normal limits  SARS CORONAVIRUS 2 (TAT 6-24 HRS)  LIPASE, BLOOD  CBC    EKG EKG Interpretation  Date/Time:  Sunday August 06 2019 17:22:55 EST Ventricular Rate:  117 PR Interval:  126 QRS Duration: 108 QT Interval:  354 QTC  Calculation: 493 R Axis:   82 Text Interpretation: Sinus tachycardia Possible Left atrial enlargement Anteroseptal infarct , age undetermined Abnormal ECG Confirmed by Carmin Muskrat [phone]) on 08/04/2019 9:31:05 PM   Radiology Ct Abdomen Pelvis Wo Contrast  Result Date: 08/20/2019 CLINICAL DATA:  85 year old female with generalized abdominal pain and nausea since yesterday. EXAM: CT ABDOMEN AND PELVIS WITHOUT CONTRAST TECHNIQUE: Multidetector CT imaging of the abdomen and pelvis was performed following the standard protocol without IV contrast. COMPARISON:  Report of CT Abdomen and Pelvis 05/24/2001 (no images available). FINDINGS: Lower chest: Negative; minimal right lung  base atelectasis or scarring. No cardiomegaly, pericardial or pleural effusion. Hepatobiliary: Possible small round nonspecific 25 millimeter hypodense lesion of the lateral left hepatic lobe on coronal image 41. Otherwise negative noncontrast liver and gallbladder. Pancreas: Difficult to delineate, grossly negative. Spleen: Diminutive, negative. Adrenals/Urinary Tract: Adrenal glands are difficult to identified. Bilateral nephrolithiasis including a bulky 8-9 millimeter left upper pole calculus. But no hydronephrosis or perinephric stranding. The ureters are difficult to delineate. The urinary bladder seems to be completely decompressed. Stomach/Bowel: Diffusely fluid-filled dilated bowel loops throughout the abdomen and pelvis. Evidence of a decompressed rectum and a decompressed sigmoid colon along the left pelvic sidewall. In the left lower quadrant there is a small volume of what appears to be large bowel gas at the junction of the descending and sigmoid colon (coronal image 59). However, upstream of this the descending colon is indistinct and then the splenic flexure is dilated and fluid-filled (series 3, image 18). Dilated mostly fluid containing bowel loops upstream from that point all the way towards the proximal jejunum. Both  the stomach and duodenum seem to be decompressed. A small to moderate gastric hiatal hernia is suspected. No definite pneumoperitoneum.  No definite free fluid. Vascular/Lymphatic: Vascular patency is not evaluated in the absence of IV contrast. Extensive Aortoiliac calcified atherosclerosis. Reproductive: Not identified, probably surgically absent. Other: No definite pelvic free fluid. Musculoskeletal: Osteopenia. No acute osseous abnormality identified. IMPRESSION: 1. High-grade bowel obstruction which appears to be at the level of the descending colon. The bowel in the region of the transition is poorly delineated in the absence of contrast, but in this age group consider an obstructing tumor. The rectum and sigmoid seem to be completely decompressed arguing against a distal large bowel volvulus. A repeat study with contrast would be valuable when feasible. 2. No definite free air or free fluid. 3. Suspected nonspecific 2.5 cm hypodense lesion in the left hepatic lobe. 4. Small hiatal hernia. Nephrolithiasis without obstructive uropathy. 5.  Aortic Atherosclerosis (ICD10-I70.0). Electronically Signed   By: Genevie Ann M.D.   On: 08/04/2019 19:57    Procedures Procedures (including critical care time)  Medications Ordered in ED Medications  sodium chloride flush (NS) 0.9 % injection 3 mL (3 mLs Intravenous Given 08/12/2019 1841)  sodium chloride 0.9 % bolus 500 mL (0 mLs Intravenous Stopped 07/28/2019 1940)     Initial Impression / Assessment and Plan / ED Course  I have reviewed the triage vital signs and the nursing notes.  Pertinent labs & imaging results that were available during my care of the patient were reviewed by me and considered in my medical decision making (see chart for details).    Given concern for abdominal pain, in this lady with HIV, broad differential including obstruction versus infection with opportunistic infection considered.   CT scan consistent with acute bowel obstruction,  with substantial dilated bowel throughout, on my interpretation. Given this finding, with additional concern for possible malignancy, I discussed her imaging, presentation with Dr. Donne Hazel our surgeon on-call He and the surgery team will follow as a consulting service.  Patient will have NG tube, be admitted to medicine.  9:32 PM Patient in no distress, resting on her side. 9:32 PM I discussed the patient's case with our internal medicine colleagues for admission.  This 85 year old female with HIV, seemingly well controlled now presents with abdominal pain, nausea.  Mentation is found to have a bowel obstruction, requiring placement of NG tube, admission after discussion with our surgical colleagues, and internal medicine  colleagues.  Final Clinical Impressions(s) / ED Diagnoses   Final diagnoses:  Other complete intestinal obstruction [facility])     Carmin Muskrat, MD 08/05/2019 2133

## 2019-08-06 NOTE — ED Notes (Signed)
Alison Ruiz made aware three unsuccessful attempts at placing NGT

## 2019-08-06 NOTE — H&P (Signed)
History and Physical   Alison Ruiz [id number] DOB: 01-May-1931 DOA: 07/30/2019  Referring MD/NP/PA: Dr. Vanita Panda  PCP: Isaac Bliss, Rayford Halsted, MD   Outpatient Specialists: Dr. Michel Bickers, infectious disease  Patient coming from: Home  Chief Complaint: Abdominal pain nausea with vomiting  HPI: Alison Ruiz is a 85 y.o. female with medical history significant of HIV disease on antiretroviral therapy with last viral load of 103 quantitative on July 05, 2019 and CD4 count of 55 on the same day, also history of acute kidney injury presenting with nausea vomiting and abdominal pain.  Patient was evaluated in the ER and found to have high-grade small bowel obstruction.  Surgery consulted.  Patient may require surgical intervention but at this point trial of conservative measures recommended by surgery.  She will be admitted to the medical service.  No fever or chills no hematemesis or melena no bright red blood per rectum.  Patient has apparently been compliant with her medications.  She denied any prior abdominal surgeries..  ED Course: Temperature is 97.9 blood pressure 90/70 pulse 123 respiratory of 27 oxygen sats 96% on room air.  Sodium is 141 potassium 3.7 chloride 103 CO2 of 18 glucose 149 BUN 52 creatinine 1.85.  CBC is entirely within normal.  Glucose 149.  Urinalysis is negative.  COVID-19 screen is currently pending.  CT abdomen pelvis showed high-grade bowel obstruction at the level of the descending colon there is poor delineation in the absence of contrast.  The rectosigmoid seem to be completely decompressed so less likely to be a distal large bowel volvulus.  Surgery was consulted and patient will be admitted to the hospital for further treatment  Review of Systems: As per HPI otherwise 10 point review of systems negative.    Past Medical History:  Diagnosis Date  . Abnormal serum level of alkaline phosphatase 01/18/2019  . AKI (acute kidney injury) ([place]) 12/2018  .  GRIEF REACTION, ACUTE 05/24/2008   Annotation: after brother's death Oct 28, 2022 Qualifier: Diagnosis of  By: Megan Salon MD, John    . HIV (human immunodeficiency virus infection) ([place])   . Loss of consciousness ([place]) 12/2018    Past Surgical History:  Procedure Laterality Date  . ABDOMINAL HYSTERECTOMY       reports that she has never smoked. She has never used smokeless tobacco. She reports that she does not drink alcohol or use drugs.  No Known Allergies  No family history on file.   Prior to Admission medications   Medication Sig Start Date End Date Taking? Authorizing Provider  acetaminophen (TYLENOL) 325 MG tablet Take 325 mg by mouth every 6 (six) hours as needed (for pain).    Yes [provider]  Darunavir-Cobicisctat-Emtricitabine-Tenofovir Alafenamide (SYMTUZA) 800-150-200-10 MG TABS Take 1 tablet by mouth daily with breakfast. 02/20/19  Yes Michel Bickers, MD  dolutegravir (TIVICAY) 50 MG tablet Take 1 tablet (50 mg total) by mouth daily. 02/20/19  Yes Michel Bickers, MD  ferrous sulfate 325 (65 FE) MG tablet Take 1 tablet (325 mg total) by mouth daily for 30 days. 02/20/19 08/14/2019 Yes Michel Bickers, MD  ondansetron (ZOFRAN) 4 MG tablet Take 4 mg by mouth every 8 (eight) hours as needed for nausea or vomiting.   Yes [provider]  sulfamethoxazole-trimethoprim (BACTRIM DS) 800-160 MG tablet Take 1 tablet by mouth daily. 02/20/19  Yes Michel Bickers, MD  feeding supplement, ENSURE ENLIVE, (ENSURE ENLIVE) LIQD Take 237 mLs by mouth 3 (three) times daily between meals. Patient taking differently: Take 1  Bottle by mouth daily.  01/20/19   Matilde Haymaker, MD    Physical Exam: Vitals:   08/04/2019 1717 07/25/2019 1745 08/08/2019 1800 08/18/2019 1900  BP: 90/70 120/87 129/82 126/74  Pulse: (!) 123 100  96  Resp: 18 (!) 27 (!) 26 20  Temp: 97.9 F (36.6 C)     TempSrc: Oral     SpO2: 97% 98%  96%      Constitutional: Cachectic, chronically ill looking, in mild distress Vitals:    08/21/2019 1717 08/13/2019 1745 07/31/2019 1800 08/01/2019 1900  BP: 90/70 120/87 129/82 126/74  Pulse: (!) 123 100  96  Resp: 18 (!) 27 (!) 26 20  Temp: 97.9 F (36.6 C)     TempSrc: Oral     SpO2: 97% 98%  96%   Eyes: PERRL, lids and conjunctivae sunken eyes ENMT: Mucous membranes are moist. Posterior pharynx clear of any exudate or lesions.edentulous Neck: normal, supple, no masses, no thyromegaly Respiratory: clear to auscultation bilaterally, no wheezing, basal crackles normal respiratory effort. No accessory muscle use.  Cardiovascular: Sinus tachycardia no murmurs / rubs / gallops. No extremity edema. 2+ pedal pulses. No carotid bruits.  Abdomen: Distended abdomen with diffuse tenderness, no masses palpated. No hepatosplenomegaly. Bowel sounds positive.  Musculoskeletal: no clubbing / cyanosis. No joint deformity upper and lower extremities. Good ROM, no contractures. Normal muscle tone.  Skin: no rashes, lesions, ulcers. No induration Neurologic: CN 2-12 grossly intact. Sensation intact, DTR normal. Strength 5/5 in all 4.  Psychiatric: Normal judgment and insight. Alert and oriented x 3. Normal mood.     Labs on Admission: I have personally reviewed following labs and imaging studies  CBC: Recent Labs  Lab 08/18/2019 1719  WBC 4.1  HGB 13.4  HCT 39.7  MCV 92.1  PLT 123456   Basic Metabolic Panel: Recent Labs  Lab 08/05/2019 1719  NA 141  K 3.7  CL 103  CO2 18*  GLUCOSE 149*  BUN 52*  CREATININE 1.85*  CALCIUM 9.1   GFR: CrCl cannot be calculated (Unknown ideal weight.). Liver Function Tests: Recent Labs  Lab 08/19/2019 1719  AST 19  ALT 14  ALKPHOS 67  BILITOT 1.6*  PROT 7.7  ALBUMIN 3.7   Recent Labs  Lab 08/04/2019 1719  LIPASE 13   No results for input(s): AMMONIA in the last 168 hours. Coagulation Profile: No results for input(s): INR, PROTIME in the last 168 hours. Cardiac Enzymes: No results for input(s): CKTOTAL, CKMB, CKMBINDEX, TROPONINI in the  last 168 hours. BNP (last 3 results) No results for input(s): PROBNP in the last 8760 hours. HbA1C: No results for input(s): HGBA1C in the last 72 hours. CBG: No results for input(s): GLUCAP in the last 168 hours. Lipid Profile: No results for input(s): CHOL, HDL, LDLCALC, TRIG, CHOLHDL, LDLDIRECT in the last 72 hours. Thyroid Function Tests: No results for input(s): TSH, T4TOTAL, FREET4, T3FREE, THYROIDAB in the last 72 hours. Anemia Panel: No results for input(s): VITAMINB12, FOLATE, FERRITIN, TIBC, IRON, RETICCTPCT in the last 72 hours. Urine analysis:    Component Value Date/Time   COLORURINE AMBER (A) 08/15/2019 2121   APPEARANCEUR HAZY (A) 08/12/2019 2121   LABSPEC 1.021 07/24/2019 2121   PHURINE 5.0 08/11/2019 2121   GLUCOSEU NEGATIVE 08/07/2019 2121   HGBUR NEGATIVE 08/12/2019 2121   BILIRUBINUR NEGATIVE 08/07/2019 2121   [place] NEGATIVE 08/21/2019 2121   PROTEINUR NEGATIVE 08/05/2019 2121   NITRITE NEGATIVE 08/19/2019 2121   LEUKOCYTESUR NEGATIVE 07/27/2019 2121   Sepsis Labs: @  LABRCNTIP(procalcitonin:4,lacticidven:4) )No results found for this or any previous visit (from the past 240 hour(s)).   Radiological Exams on Admission: Ct Abdomen Pelvis Wo Contrast  Result Date: 08/18/2019 CLINICAL DATA:  85 year old female with generalized abdominal pain and nausea since yesterday. EXAM: CT ABDOMEN AND PELVIS WITHOUT CONTRAST TECHNIQUE: Multidetector CT imaging of the abdomen and pelvis was performed following the standard protocol without IV contrast. COMPARISON:  Report of CT Abdomen and Pelvis 05/24/2001 (no images available). FINDINGS: Lower chest: Negative; minimal right lung base atelectasis or scarring. No cardiomegaly, pericardial or pleural effusion. Hepatobiliary: Possible small round nonspecific 25 millimeter hypodense lesion of the lateral left hepatic lobe on coronal image 41. Otherwise negative noncontrast liver and gallbladder. Pancreas: Difficult to  delineate, grossly negative. Spleen: Diminutive, negative. Adrenals/Urinary Tract: Adrenal glands are difficult to identified. Bilateral nephrolithiasis including a bulky 8-9 millimeter left upper pole calculus. But no hydronephrosis or perinephric stranding. The ureters are difficult to delineate. The urinary bladder seems to be completely decompressed. Stomach/Bowel: Diffusely fluid-filled dilated bowel loops throughout the abdomen and pelvis. Evidence of a decompressed rectum and a decompressed sigmoid colon along the left pelvic sidewall. In the left lower quadrant there is a small volume of what appears to be large bowel gas at the junction of the descending and sigmoid colon (coronal image 59). However, upstream of this the descending colon is indistinct and then the splenic flexure is dilated and fluid-filled (series 3, image 18). Dilated mostly fluid containing bowel loops upstream from that point all the way towards the proximal jejunum. Both the stomach and duodenum seem to be decompressed. A small to moderate gastric hiatal hernia is suspected. No definite pneumoperitoneum.  No definite free fluid. Vascular/Lymphatic: Vascular patency is not evaluated in the absence of IV contrast. Extensive Aortoiliac calcified atherosclerosis. Reproductive: Not identified, probably surgically absent. Other: No definite pelvic free fluid. Musculoskeletal: Osteopenia. No acute osseous abnormality identified. IMPRESSION: 1. High-grade bowel obstruction which appears to be at the level of the descending colon. The bowel in the region of the transition is poorly delineated in the absence of contrast, but in this age group consider an obstructing tumor. The rectum and sigmoid seem to be completely decompressed arguing against a distal large bowel volvulus. A repeat study with contrast would be valuable when feasible. 2. No definite free air or free fluid. 3. Suspected nonspecific 2.5 cm hypodense lesion in the left hepatic  lobe. 4. Small hiatal hernia. Nephrolithiasis without obstructive uropathy. 5.  Aortic Atherosclerosis (ICD10-I70.0). Electronically Signed   By: Genevie Ann M.D.   On: 08/15/2019 19:57      Assessment/Plan Principal Problem:   SBO (small bowel obstruction) (H[facility]) Active Problems:   Human immunodeficiency virus (HIV) disease ([facility])   Anxiety state   Dyslipidemia     #1 bowel obstruction: Patient will be initiated on conservative measures with NG tube to suction, IV fluids, pain control and nausea control.  If this fails she will need surgical intervention.  Appreciate surgery input.  #2 HIV disease: Patient appears to have ARDS.  She has CD4 count of only 55 about a month ago.  She is however on her rectal viral medications.  We will continue with that and defer to infectious disease.  #3 anxiety disorder: We will continue with her home regimen.  #4 hyperlipidemia: Continue home regimen  #5 acute kidney injury: Most likely due to dehydration.  Hydrate patient and follow renal function   DVT prophylaxis: Heparin Code Status: Full code Family Communication: No  family at bedside Disposition Plan: To be determined Consults called: To [facility] with general surgery Admission status: Inpatient  Severity of Illness: The appropriate patient status for this patient is INPATIENT. Inpatient status is judged to be reasonable and necessary in order to provide the required intensity of service to ensure the patient's safety. The patient's presenting symptoms, physical exam findings, and initial radiographic and laboratory data in the context of their chronic comorbidities is felt to place them at high risk for further clinical deterioration. Furthermore, it is not anticipated that the patient will be medically stable for discharge from the hospital within 2 midnights of admission. The following factors support the patient status of inpatient.   " The patient's presenting symptoms include nausea  vomiting abdominal pain. " The worrisome physical exam findings include cachexia with abdominal distention. " The initial radiographic and laboratory data are worrisome because of CT showing high-grade intestinal obstruction. " The chronic co-morbidities include HIV disease.   * I certify that at the point of admission it is my clinical judgment that the patient will require inpatient hospital care spanning beyond 2 midnights from the point of admission due to high intensity of service, high risk for further deterioration and high frequency of surveillance required.Barbette Merino MD [facility] Pager [phone]  If 7PM-7AM, please contact night-coverage [email or web address] Password [email or web address]  08/15/2019, 9:30 PM

## 2019-08-07 ENCOUNTER — Inpatient Hospital Stay (HOSPITAL_COMMUNITY): Payer: Medicare Other

## 2019-08-07 ENCOUNTER — Encounter (HOSPITAL_COMMUNITY): Admission: EM | Disposition: E | Payer: Self-pay | Source: Home / Self Care | Attending: Critical Care Medicine

## 2019-08-07 ENCOUNTER — Inpatient Hospital Stay (HOSPITAL_COMMUNITY): Payer: Medicare Other | Admitting: Anesthesiology

## 2019-08-07 ENCOUNTER — Other Ambulatory Visit: Payer: Self-pay

## 2019-08-07 ENCOUNTER — Encounter (HOSPITAL_COMMUNITY): Payer: Self-pay | Admitting: Certified Registered Nurse Anesthetist

## 2019-08-07 DIAGNOSIS — Z9049 Acquired absence of other specified parts of digestive tract: Secondary | ICD-10-CM

## 2019-08-07 DIAGNOSIS — J9601 Acute respiratory failure with hypoxia: Secondary | ICD-10-CM

## 2019-08-07 DIAGNOSIS — K56609 Unspecified intestinal obstruction, unspecified as to partial versus complete obstruction: Secondary | ICD-10-CM

## 2019-08-07 HISTORY — PX: COLON RESECTION: SHX5231

## 2019-08-07 HISTORY — PX: LAPAROTOMY: SHX154

## 2019-08-07 LAB — COMPREHENSIVE METABOLIC PANEL
ALT: 15 U/L (ref 0–44)
AST: 28 U/L (ref 15–41)
Albumin: 3.4 g/dL — ABNORMAL LOW (ref 3.5–5.0)
Alkaline Phosphatase: 63 U/L (ref 38–126)
Anion gap: 20 — ABNORMAL HIGH (ref 5–15)
BUN: 74 mg/dL — ABNORMAL HIGH (ref 8–23)
CO2: 17 mmol/L — ABNORMAL LOW (ref 22–32)
Calcium: 8.4 mg/dL — ABNORMAL LOW (ref 8.9–10.3)
Chloride: 104 mmol/L (ref 98–111)
Creatinine, Ser: 2.45 mg/dL — ABNORMAL HIGH (ref 0.44–1.00)
GFR calc Af Amer: 20 mL/min — ABNORMAL LOW (ref >60)
GFR calc non Af Amer: 17 mL/min — ABNORMAL LOW (ref >60)
Glucose, Bld: 191 mg/dL — ABNORMAL HIGH (ref 70–99)
Potassium: 5.5 mmol/L — ABNORMAL HIGH (ref 3.5–5.1)
Sodium: 141 mmol/L (ref 135–145)
Total Bilirubin: 2.1 mg/dL — ABNORMAL HIGH (ref 0.3–1.2)
Total Protein: 6.8 g/dL (ref 6.5–8.1)

## 2019-08-07 LAB — BLOOD GAS, ARTERIAL
Acid-base deficit: 15.4 mmol/L — ABNORMAL HIGH (ref 0.0–2.0)
Acid-base deficit: 10.7 mmol/L — ABNORMAL HIGH (ref 0.0–2.0)
Bicarbonate: 13.3 mmol/L — ABNORMAL LOW (ref 20.0–28.0)
Bicarbonate: 16.4 mmol/L — ABNORMAL LOW (ref 20.0–28.0)
Drawn by: 35849
Drawn by: 358491
FIO2: 60
FIO2: 60
O2 Saturation: 93.7 %
O2 Saturation: 93.1 %
Patient temperature: 37
Patient temperature: 37
pCO2 arterial: 52 mmHg — ABNORMAL HIGH (ref 32.0–48.0)
pCO2 arterial: 47.4 mmHg (ref 32.0–48.0)
pH, Arterial: 7.037 — CL (ref 7.350–7.450)
pH, Arterial: 7.164 — CL (ref 7.350–7.450)
pO2, Arterial: 124 mmHg — ABNORMAL HIGH (ref 83.0–108.0)
pO2, Arterial: 107 mmHg (ref 83.0–108.0)

## 2019-08-07 LAB — BASIC METABOLIC PANEL
Anion gap: 18 — ABNORMAL HIGH (ref 5–15)
BUN: 76 mg/dL — ABNORMAL HIGH (ref 8–23)
CO2: 19 mmol/L — ABNORMAL LOW (ref 22–32)
Calcium: 7.2 mg/dL — ABNORMAL LOW (ref 8.9–10.3)
Chloride: 108 mmol/L (ref 98–111)
Creatinine, Ser: 2.67 mg/dL — ABNORMAL HIGH (ref 0.44–1.00)
GFR calc Af Amer: 18 mL/min — ABNORMAL LOW (ref >60)
GFR calc non Af Amer: 16 mL/min — ABNORMAL LOW (ref >60)
Glucose, Bld: 118 mg/dL — ABNORMAL HIGH (ref 70–99)
Potassium: 5.1 mmol/L (ref 3.5–5.1)
Sodium: 145 mmol/L (ref 135–145)

## 2019-08-07 LAB — CBC
HCT: 38.5 % (ref 36.0–46.0)
HCT: 30.7 % — ABNORMAL LOW (ref 36.0–46.0)
Hemoglobin: 13.4 g/dL (ref 12.0–15.0)
Hemoglobin: 10.6 g/dL — ABNORMAL LOW (ref 12.0–15.0)
MCH: 31.5 pg (ref 26.0–34.0)
MCH: 31.4 pg (ref 26.0–34.0)
MCHC: 34.8 g/dL (ref 30.0–36.0)
MCHC: 34.5 g/dL (ref 30.0–36.0)
MCV: 90.4 fL (ref 80.0–100.0)
MCV: 90.8 fL (ref 80.0–100.0)
Platelets: 246 K/uL (ref 150–400)
Platelets: 225 10*3/uL (ref 150–400)
RBC: 4.26 MIL/uL (ref 3.87–5.11)
RBC: 3.38 MIL/uL — ABNORMAL LOW (ref 3.87–5.11)
RDW: 14.5 % (ref 11.5–15.5)
RDW: 16.9 % — ABNORMAL HIGH (ref 11.5–15.5)
WBC: 6.3 K/uL (ref 4.0–10.5)
WBC: 5.4 10*3/uL (ref 4.0–10.5)
nRBC: 0 % (ref 0.0–0.2)
nRBC: 0.4 % — ABNORMAL HIGH (ref 0.0–0.2)

## 2019-08-07 LAB — HEMOGLOBIN A1C
Hgb A1c MFr Bld: 5.2 % (ref 4.8–5.6)
Mean Plasma Glucose: 102.54 mg/dL

## 2019-08-07 LAB — RENAL FUNCTION PANEL
Albumin: 3.4 g/dL — ABNORMAL LOW (ref 3.5–5.0)
Anion gap: 16 — ABNORMAL HIGH (ref 5–15)
BUN: 77 mg/dL — ABNORMAL HIGH (ref 8–23)
CO2: 14 mmol/L — ABNORMAL LOW (ref 22–32)
Calcium: 7.5 mg/dL — ABNORMAL LOW (ref 8.9–10.3)
Chloride: 110 mmol/L (ref 98–111)
Creatinine, Ser: 2.77 mg/dL — ABNORMAL HIGH (ref 0.44–1.00)
GFR calc Af Amer: 17 mL/min — ABNORMAL LOW (ref >60)
GFR calc non Af Amer: 15 mL/min — ABNORMAL LOW (ref >60)
Glucose, Bld: 176 mg/dL — ABNORMAL HIGH (ref 70–99)
Phosphorus: 7.5 mg/dL — ABNORMAL HIGH (ref 2.5–4.6)
Potassium: 6 mmol/L — ABNORMAL HIGH (ref 3.5–5.1)
Sodium: 140 mmol/L (ref 135–145)

## 2019-08-07 LAB — POCT I-STAT 7, (LYTES, BLD GAS, ICA,H+H)
Acid-base deficit: 9 mmol/L — ABNORMAL HIGH (ref 0.0–2.0)
Bicarbonate: 18.8 mmol/L — ABNORMAL LOW (ref 20.0–28.0)
Calcium, Ion: 1.03 mmol/L — ABNORMAL LOW (ref 1.15–1.40)
HCT: 22 % — ABNORMAL LOW (ref 36.0–46.0)
Hemoglobin: 7.5 g/dL — ABNORMAL LOW (ref 12.0–15.0)
O2 Saturation: 95 %
Patient temperature: 97.7
Potassium: 5 mmol/L (ref 3.5–5.1)
Sodium: 146 mmol/L — ABNORMAL HIGH (ref 135–145)
TCO2: 20 mmol/L — ABNORMAL LOW (ref 22–32)
pCO2 arterial: 47.5 mmHg (ref 32.0–48.0)
pH, Arterial: 7.202 — ABNORMAL LOW (ref 7.350–7.450)
pO2, Arterial: 88 mmHg (ref 83.0–108.0)

## 2019-08-07 LAB — SURGICAL PCR SCREEN
MRSA, PCR: NEGATIVE
Staphylococcus aureus: NEGATIVE

## 2019-08-07 LAB — ABO/RH: ABO/RH(D): O POS

## 2019-08-07 LAB — GLUCOSE, CAPILLARY
Glucose-Capillary: 153 mg/dL — ABNORMAL HIGH (ref 70–99)
Glucose-Capillary: 207 mg/dL — ABNORMAL HIGH (ref 70–99)
Glucose-Capillary: 52 mg/dL — ABNORMAL LOW (ref 70–99)

## 2019-08-07 LAB — MRSA PCR SCREENING: MRSA by PCR: NEGATIVE

## 2019-08-07 LAB — LACTIC ACID, PLASMA
Lactic Acid, Venous: 6.8 mmol/L (ref 0.5–1.9)
Lactic Acid, Venous: 8.3 mmol/L (ref 0.5–1.9)

## 2019-08-07 LAB — MAGNESIUM: Magnesium: 2.3 mg/dL (ref 1.7–2.4)

## 2019-08-07 LAB — SARS CORONAVIRUS 2 (TAT 6-24 HRS): SARS Coronavirus 2: NEGATIVE

## 2019-08-07 SURGERY — COLON RESECTION
Anesthesia: General | Site: Abdomen

## 2019-08-07 MED ORDER — ORAL CARE MOUTH RINSE
15.0000 mL | OROMUCOSAL | Status: DC
  Administered 2019-08-07 – 2019-08-09 (×23): 15 mL via OROMUCOSAL

## 2019-08-07 MED ORDER — ONDANSETRON HCL 4 MG/2ML IJ SOLN
INTRAMUSCULAR | Status: DC | PRN
  Administered 2019-08-07: 4 mg via INTRAVENOUS

## 2019-08-07 MED ORDER — INSULIN ASPART 100 UNIT/ML IV SOLN
10.0000 [IU] | Freq: Once | INTRAVENOUS | Status: AC
  Administered 2019-08-07: 10 [IU] via INTRAVENOUS

## 2019-08-07 MED ORDER — DEXTROSE 50 % IV SOLN
50.0000 mL | Freq: Once | INTRAVENOUS | Status: AC
  Administered 2019-08-07: 50 mL via INTRAVENOUS
  Filled 2019-08-07: qty 50

## 2019-08-07 MED ORDER — DEXTROSE 50 % IV SOLN
50.0000 mL | Freq: Once | INTRAVENOUS | Status: AC
  Administered 2019-08-07: 50 mL via INTRAVENOUS

## 2019-08-07 MED ORDER — PHENYLEPHRINE HCL-NACL 10-0.9 MG/250ML-% IV SOLN
0.0000 ug/min | INTRAVENOUS | Status: DC
  Administered 2019-08-07: 20 ug/min via INTRAVENOUS
  Administered 2019-08-08: 200 ug/min via INTRAVENOUS
  Filled 2019-08-07 (×2): qty 250

## 2019-08-07 MED ORDER — ROCURONIUM BROMIDE 10 MG/ML (PF) SYRINGE
PREFILLED_SYRINGE | INTRAVENOUS | Status: AC
  Filled 2019-08-07: qty 10

## 2019-08-07 MED ORDER — SODIUM CHLORIDE 0.9 % IV SOLN
2.0000 g | Freq: Once | INTRAVENOUS | Status: AC
  Administered 2019-08-07: 2 g via INTRAVENOUS
  Filled 2019-08-07: qty 2

## 2019-08-07 MED ORDER — DEXMEDETOMIDINE HCL IN NACL 400 MCG/100ML IV SOLN
0.0000 ug/kg/h | INTRAVENOUS | Status: DC
  Administered 2019-08-07: 0.2 ug/kg/h via INTRAVENOUS
  Administered 2019-08-08: 0.8 ug/kg/h via INTRAVENOUS
  Filled 2019-08-07 (×4): qty 100

## 2019-08-07 MED ORDER — STERILE WATER FOR INJECTION IV SOLN
INTRAVENOUS | Status: DC
  Administered 2019-08-07 – 2019-08-08 (×2): via INTRAVENOUS
  Filled 2019-08-07 (×2): qty 850

## 2019-08-07 MED ORDER — LACTATED RINGERS IV BOLUS
1000.0000 mL | Freq: Once | INTRAVENOUS | Status: AC
  Administered 2019-08-07: 1000 mL via INTRAVENOUS

## 2019-08-07 MED ORDER — FENTANYL 2500MCG IN NS 250ML (10MCG/ML) PREMIX INFUSION
0.0000 ug/h | INTRAVENOUS | Status: DC
  Administered 2019-08-07: 12.5 ug/h via INTRAVENOUS
  Administered 2019-08-09: 100 ug/h via INTRAVENOUS
  Filled 2019-08-07 (×2): qty 250

## 2019-08-07 MED ORDER — PHENYLEPHRINE HCL (PRESSORS) 10 MG/ML IV SOLN
INTRAVENOUS | Status: AC
  Filled 2019-08-07: qty 1

## 2019-08-07 MED ORDER — FENTANYL CITRATE (PF) 250 MCG/5ML IJ SOLN
INTRAMUSCULAR | Status: AC
  Filled 2019-08-07: qty 5

## 2019-08-07 MED ORDER — CHLORHEXIDINE GLUCONATE 0.12% ORAL RINSE (MEDLINE KIT)
15.0000 mL | Freq: Two times a day (BID) | OROMUCOSAL | Status: DC
  Administered 2019-08-07 – 2019-08-08 (×2): 15 mL via OROMUCOSAL

## 2019-08-07 MED ORDER — LIDOCAINE 2% (20 MG/ML) 5 ML SYRINGE
INTRAMUSCULAR | Status: DC | PRN
  Administered 2019-08-07: 40 mg via INTRAVENOUS

## 2019-08-07 MED ORDER — FENTANYL CITRATE (PF) 100 MCG/2ML IJ SOLN
25.0000 ug | INTRAMUSCULAR | Status: DC | PRN
  Administered 2019-08-07 (×2): 25 ug via INTRAVENOUS
  Filled 2019-08-07 (×2): qty 2

## 2019-08-07 MED ORDER — NOREPINEPHRINE 4 MG/250ML-% IV SOLN
INTRAVENOUS | Status: AC
  Administered 2019-08-07: 10 ug/min via INTRAVENOUS
  Filled 2019-08-07: qty 250

## 2019-08-07 MED ORDER — LACTATED RINGERS IV BOLUS
500.0000 mL | Freq: Once | INTRAVENOUS | Status: AC
  Administered 2019-08-07: 500 mL via INTRAVENOUS

## 2019-08-07 MED ORDER — MIDAZOLAM HCL 2 MG/2ML IJ SOLN: 1.0000 mg | INTRAMUSCULAR | Status: DC | PRN

## 2019-08-07 MED ORDER — ONDANSETRON HCL 4 MG/2ML IJ SOLN
INTRAMUSCULAR | Status: AC
  Filled 2019-08-07: qty 2

## 2019-08-07 MED ORDER — PROPOFOL 500 MG/50ML IV EMUL
INTRAVENOUS | Status: DC | PRN
  Administered 2019-08-07: 25 ug/kg/min via INTRAVENOUS

## 2019-08-07 MED ORDER — PHENYLEPHRINE 40 MCG/ML (10ML) SYRINGE FOR IV PUSH (FOR BLOOD PRESSURE SUPPORT)
PREFILLED_SYRINGE | INTRAVENOUS | Status: AC
  Filled 2019-08-07: qty 10

## 2019-08-07 MED ORDER — PANTOPRAZOLE SODIUM 40 MG IV SOLR
40.0000 mg | Freq: Every day | INTRAVENOUS | Status: DC
  Administered 2019-08-07 – 2019-08-09 (×3): 40 mg via INTRAVENOUS
  Filled 2019-08-07 (×3): qty 40

## 2019-08-07 MED ORDER — FENTANYL CITRATE (PF) 250 MCG/5ML IJ SOLN
INTRAMUSCULAR | Status: DC | PRN
  Administered 2019-08-07 (×5): 50 ug via INTRAVENOUS

## 2019-08-07 MED ORDER — SODIUM BICARBONATE 8.4 % IV SOLN: 100.0000 meq | Freq: Once | INTRAVENOUS | Status: DC

## 2019-08-07 MED ORDER — PROPOFOL 10 MG/ML IV BOLUS
INTRAVENOUS | Status: AC
  Filled 2019-08-07: qty 20

## 2019-08-07 MED ORDER — LACTATED RINGERS IV SOLN: INTRAVENOUS | Status: DC

## 2019-08-07 MED ORDER — VASOPRESSIN 20 UNIT/ML IV SOLN
INTRAVENOUS | Status: DC | PRN
  Administered 2019-08-07 (×3): 1 [IU] via INTRAVENOUS

## 2019-08-07 MED ORDER — LACTATED RINGERS IV SOLN
INTRAVENOUS | Status: DC | PRN
  Administered 2019-08-07: 13:00:00 via INTRAVENOUS

## 2019-08-07 MED ORDER — CALCIUM GLUCONATE-NACL 2-0.675 GM/100ML-% IV SOLN
2.0000 g | Freq: Once | INTRAVENOUS | Status: AC
  Administered 2019-08-07: 2000 mg via INTRAVENOUS
  Filled 2019-08-07: qty 100

## 2019-08-07 MED ORDER — ROCURONIUM BROMIDE 10 MG/ML (PF) SYRINGE
PREFILLED_SYRINGE | INTRAVENOUS | Status: DC | PRN
  Administered 2019-08-07: 30 mg via INTRAVENOUS
  Administered 2019-08-07: 50 mg via INTRAVENOUS

## 2019-08-07 MED ORDER — ALBUMIN HUMAN 25 % IV SOLN
25.0000 g | Freq: Once | INTRAVENOUS | Status: AC
  Administered 2019-08-07: 25 g via INTRAVENOUS
  Filled 2019-08-07: qty 50

## 2019-08-07 MED ORDER — FENTANYL CITRATE (PF) 100 MCG/2ML IJ SOLN
25.0000 ug | INTRAMUSCULAR | Status: DC | PRN
  Administered 2019-08-07: 21:00:00 50 ug via INTRAVENOUS
  Filled 2019-08-07: qty 2

## 2019-08-07 MED ORDER — PHENYLEPHRINE 40 MCG/ML (10ML) SYRINGE FOR IV PUSH (FOR BLOOD PRESSURE SUPPORT)
PREFILLED_SYRINGE | INTRAVENOUS | Status: DC | PRN
  Administered 2019-08-07 (×2): 120 ug via INTRAVENOUS
  Administered 2019-08-07: 80 ug via INTRAVENOUS
  Administered 2019-08-07 (×4): 120 ug via INTRAVENOUS
  Administered 2019-08-07: 80 ug via INTRAVENOUS

## 2019-08-07 MED ORDER — ALBUTEROL SULFATE (2.5 MG/3ML) 0.083% IN NEBU: 2.5000 mg | INHALATION_SOLUTION | RESPIRATORY_TRACT | Status: DC | PRN

## 2019-08-07 MED ORDER — 0.9 % SODIUM CHLORIDE (POUR BTL) OPTIME
TOPICAL | Status: DC | PRN
  Administered 2019-08-07: 2000 mL

## 2019-08-07 MED ORDER — CHLORHEXIDINE GLUCONATE CLOTH 2 % EX PADS
6.0000 | MEDICATED_PAD | Freq: Every day | CUTANEOUS | Status: DC
  Administered 2019-08-07: 6 via TOPICAL

## 2019-08-07 MED ORDER — SODIUM CHLORIDE 0.9 % IV SOLN
3.0000 g | INTRAVENOUS | Status: DC
  Administered 2019-08-07 – 2019-08-08 (×3): 3 g via INTRAVENOUS
  Filled 2019-08-07 (×2): qty 8

## 2019-08-07 MED ORDER — ALBUMIN HUMAN 5 % IV SOLN
25.0000 g | Freq: Once | INTRAVENOUS | Status: AC
  Administered 2019-08-07: 25 g via INTRAVENOUS
  Filled 2019-08-07: qty 250

## 2019-08-07 MED ORDER — PROPOFOL 10 MG/ML IV BOLUS
INTRAVENOUS | Status: DC | PRN
  Administered 2019-08-07: 70 mg via INTRAVENOUS

## 2019-08-07 MED ORDER — NOREPINEPHRINE 4 MG/250ML-% IV SOLN
0.0000 ug/min | INTRAVENOUS | Status: DC
  Administered 2019-08-07: 15:00:00 10 ug/min via INTRAVENOUS

## 2019-08-07 MED ORDER — SODIUM CHLORIDE 0.9 % IV SOLN
INTRAVENOUS | Status: DC | PRN
  Administered 2019-08-07: 12:00:00 via INTRAVENOUS

## 2019-08-07 MED ORDER — ALBUMIN HUMAN 5 % IV SOLN
INTRAVENOUS | Status: DC | PRN
  Administered 2019-08-07 (×2): via INTRAVENOUS

## 2019-08-07 MED ORDER — DEXTROSE 50 % IV SOLN
INTRAVENOUS | Status: AC
  Administered 2019-08-07: 50 mL via INTRAVENOUS
  Filled 2019-08-07: qty 50

## 2019-08-07 MED ORDER — SODIUM BICARBONATE 8.4 % IV SOLN
50.0000 meq | Freq: Once | INTRAVENOUS | Status: AC
  Administered 2019-08-07: 50 meq via INTRAVENOUS
  Filled 2019-08-07: qty 50

## 2019-08-07 MED ORDER — NOREPINEPHRINE 16 MG/250ML-% IV SOLN
0.0000 ug/min | INTRAVENOUS | Status: DC
  Administered 2019-08-07: 20 ug/min via INTRAVENOUS
  Administered 2019-08-08: 40 ug/min via INTRAVENOUS
  Administered 2019-08-08: 25 ug/min via INTRAVENOUS
  Administered 2019-08-09 (×2): 40 ug/min via INTRAVENOUS
  Filled 2019-08-07 (×6): qty 250

## 2019-08-07 MED ORDER — PHENYLEPHRINE HCL-NACL 10-0.9 MG/250ML-% IV SOLN
INTRAVENOUS | Status: DC | PRN
  Administered 2019-08-07: 25 ug/min via INTRAVENOUS

## 2019-08-07 MED ORDER — DEXTROSE-NACL 5-0.9 % IV SOLN: INTRAVENOUS | Status: DC

## 2019-08-07 MED ORDER — PROPOFOL 1000 MG/100ML IV EMUL
INTRAVENOUS | Status: AC
  Filled 2019-08-07: qty 200

## 2019-08-07 SURGICAL SUPPLY — 43 items
BLADE CLIPPER SURG (BLADE) IMPLANT
BNDG GAUZE ELAST 4 BULKY (GAUZE/BANDAGES/DRESSINGS) ×1 IMPLANT
CANISTER SUCT 3000ML PPV (MISCELLANEOUS) ×3 IMPLANT
COVER SURGICAL LIGHT HANDLE (MISCELLANEOUS) ×5 IMPLANT
COVER WAND RF STERILE (DRAPES) ×2 IMPLANT
DRSG OPSITE POSTOP 4X10 (GAUZE/BANDAGES/DRESSINGS) IMPLANT
DRSG OPSITE POSTOP 4X8 (GAUZE/BANDAGES/DRESSINGS) IMPLANT
ELECT CAUTERY BLADE 6.4 (BLADE) ×5 IMPLANT
ELECT REM PT RETURN 9FT ADLT (ELECTROSURGICAL) ×3
ELECTRODE REM PT RTRN 9FT ADLT (ELECTROSURGICAL) ×2 IMPLANT
GAUZE SPONGE 4X4 12PLY STRL LF (GAUZE/BANDAGES/DRESSINGS) ×1 IMPLANT
GLOVE SURG SIGNA 7.5 PF LTX (GLOVE) ×6 IMPLANT
GOWN STRL REUS W/ TWL LRG LVL3 (GOWN DISPOSABLE) ×8 IMPLANT
GOWN STRL REUS W/ TWL XL LVL3 (GOWN DISPOSABLE) ×4 IMPLANT
GOWN STRL REUS W/TWL LRG LVL3 (GOWN DISPOSABLE) ×6
GOWN STRL REUS W/TWL XL LVL3 (GOWN DISPOSABLE) ×3
KIT OSTOMY DRAINABLE 2.75 STR (WOUND CARE) ×1 IMPLANT
KIT SIGMOIDOSCOPE (SET/KITS/TRAYS/PACK) IMPLANT
KIT TURNOVER KIT B (KITS) ×3 IMPLANT
LIGASURE IMPACT 36 18CM CVD LR (INSTRUMENTS) ×1 IMPLANT
NS IRRIG 1000ML POUR BTL (IV SOLUTION) ×6 IMPLANT
PACK COLON (CUSTOM PROCEDURE TRAY) ×3 IMPLANT
PAD ABD 8X10 STRL (GAUZE/BANDAGES/DRESSINGS) ×1 IMPLANT
PAD ARMBOARD 7.5X6 YLW CONV (MISCELLANEOUS) ×3 IMPLANT
PENCIL SMOKE EVACUATOR (MISCELLANEOUS) ×1 IMPLANT
RELOAD PROXIMATE 75MM BLUE (ENDOMECHANICALS) ×6 IMPLANT
RELOAD STAPLE 75 3.8 BLU REG (ENDOMECHANICALS) IMPLANT
SPECIMEN JAR X LARGE (MISCELLANEOUS) ×3 IMPLANT
SPONGE LAP 18X18 RF (DISPOSABLE) ×1 IMPLANT
STAPLER PROXIMATE 75MM BLUE (STAPLE) ×1 IMPLANT
STAPLER VISISTAT 35W (STAPLE) ×3 IMPLANT
SURGILUBE 2OZ TUBE FLIPTOP (MISCELLANEOUS) IMPLANT
SUT PDS AB 1 TP1 96 (SUTURE) ×6 IMPLANT
SUT PROLENE 2 0 CT2 30 (SUTURE) IMPLANT
SUT PROLENE 2 0 KS (SUTURE) IMPLANT
SUT SILK 2 0 SH CR/8 (SUTURE) ×3 IMPLANT
SUT SILK 2 0 TIES 10X30 (SUTURE) ×3 IMPLANT
SUT SILK 3 0 SH CR/8 (SUTURE) ×3 IMPLANT
SUT SILK 3 0 TIES 10X30 (SUTURE) ×3 IMPLANT
SUT VIC AB 3-0 SH 8-18 (SUTURE) IMPLANT
TAPE CLOTH SURG 4X10 WHT LF (GAUZE/BANDAGES/DRESSINGS) ×1 IMPLANT
TRAY FOLEY MTR SLVR 14FR STAT (SET/KITS/TRAYS/PACK) ×1 IMPLANT
YANKAUER SUCT BULB TIP NO VENT (SUCTIONS) ×1 IMPLANT

## 2019-08-07 NOTE — Progress Notes (Signed)
[facility] Progress Note Patient Name: Alison Ruiz DOB: 01/02/1931 MRN: [id number]   Date of Service  07/31/2019  HPI/Events of Note  Hypotension and lactic acidosis  eICU Interventions  Albumin 500 ml iv bolus x 1, ABG check, Sodium bicarbonate 100 meq iv x 1 if PH still <  7.2 on ABG, will check BMET and ionized calcium as well.        [name] 08/21/2019, 7:59 PM

## 2019-08-07 NOTE — Progress Notes (Signed)
Called elink with concerns related to HR=146 and cuff/aline not correlating.  Will await further orders and continue to monitor closely.

## 2019-08-07 NOTE — Procedures (Signed)
Arterial Catheter Insertion Procedure Note Graciemae Tabin [id number] November 07, 1930  Procedure: Insertion of Arterial Catheter  Indications: Blood pressure monitoring  Procedure Details Consent: Unable to obtain consent because of altered level of consciousness. Time Out: Verified patient identification, verified procedure, site/side was marked, verified correct patient position, special equipment/implants available, medications/allergies/relevent history reviewed, required imaging and test results available.  Performed  Maximum sterile technique was used including antiseptics, cap, gloves, gown, hand hygiene and mask. Skin prep: Chlorhexidine; local anesthetic administered 20 gauge catheter was inserted into right radial artery using the Seldinger technique. ULTRASOUND GUIDANCE USED: NO Evaluation Blood flow good; BP tracing good. Complications: No apparent complications.   Mcneil Sober 08/13/2019

## 2019-08-07 NOTE — Transfer of Care (Signed)
Immediate Anesthesia Transfer of Care Note  Patient: Alison Ruiz  Procedure(s) Performed: PARTIAL COLECTOMY AND COLOSTOMY (N/A Abdomen) Exploratory Laparotomy (Abdomen)  Patient Location: ICU  Anesthesia Type:General  Level of Consciousness: intubated and sedated on the vent  Airway & Oxygen Therapy: Patient remains intubated per anesthesia plan and Patient placed on Ventilator (see vital sign flow sheet for setting)  Post-op Assessment: Report given to RN and Post -op Vital signs reviewed and stable  Post vital signs: Reviewed and stable  Last Vitals:  Vitals Value Taken Time  BP 112/28 08/13/2019 1427  Temp    Pulse 118   Resp 16 07/25/2019 1428  SpO2 94   Vitals shown include unvalidated device data.  Last Pain:  Vitals:   08/13/2019 0926  TempSrc: Oral  PainSc: 6       Patients Stated Pain Goal: 0 ([id number])  Complications: No apparent anesthesia complications

## 2019-08-07 NOTE — Progress Notes (Signed)
[facility] Progress Note Patient Name: Alison Ruiz DOB: Jan 30, 1931 MRN: [id number]   Date of Service  08/02/2019  HPI/Events of Note  ETT has migrated 2 cm proximally.  eICU Interventions  ETT advanced to 20 cm. Stat portable CXR ordered to verify ETT position.        Kerry Kass Davionna Blacksher 07/24/2019, 8:27 PM

## 2019-08-07 NOTE — Progress Notes (Signed)
Hypoglycemic Event  CBG: 52  Treatment: D50 50 mL (25 gm)  Symptoms: None  Follow-up CBG: Time:0010 CBG Result:162  Possible Reasons for Event: Unknown  Comments/MD notified:protocol followed    Alison Ruiz

## 2019-08-07 NOTE — Anesthesia Procedure Notes (Signed)
Central Venous Catheter Insertion Performed by: Albertha Ghee, MD, anesthesiologist Start/End11/13/2020 12:22 PM, 08/07/2019 12:22 PM Patient location: OR. Preanesthetic checklist: patient identified, IV checked, site marked, risks and benefits discussed, surgical consent, monitors and equipment checked, pre-op evaluation, timeout performed and anesthesia consent Position: Trendelenburg Patient sedated Hand hygiene performed , maximum sterile barriers used  and Seldinger technique used Catheter size: 7 Fr Central line was placed.Double lumen Procedure performed using ultrasound guided technique. Ultrasound Notes:anatomy identified, needle tip was noted to be adjacent to the nerve/plexus identified and no ultrasound evidence of intravascular and/or intraneural injection Attempts: 1 Following insertion, line sutured, dressing applied and Biopatch. Post procedure assessment: blood return through all ports, free fluid flow and no air  Patient tolerated the procedure well with no immediate complications.

## 2019-08-07 NOTE — Progress Notes (Signed)
Pt noted to have stool leaking from her colostomy.  The colostomy attachment piece is no adhered to skim d/t not enough space between stoma and incision.  450 cc of stool out from bag and 400 cc collected in canister.  CCS paged to update and advise.  Will continue to monitor closely.

## 2019-08-07 NOTE — Progress Notes (Signed)
Patient belongings delivered to room 3M09 by this RN. Patients' 2 gold chains, 3 gold rings, 2 bracelets, and 1 earring returned and accounted for with Providence Lanius, RN at the bedside. Dorthey Sawyer, RN

## 2019-08-07 NOTE — Progress Notes (Addendum)
After admission assessment to 3MW, I noticed colostomy pouch is not intact with some stool draining into the midline incision. Paged CCS MD Rhunette Croft and received an order to change the midline incision dressing and ostomy. WOC consulted STAT as there are no colostomy pouches on unit and Kellie Simmering # unknown. Will continue to monitor and follow up.  Dewaine Oats, RN

## 2019-08-07 NOTE — Progress Notes (Signed)
50 mcg of fentanyl wasted down sink with Mariann Barter, RN

## 2019-08-07 NOTE — Progress Notes (Signed)
Subjective/Chief Complaint: Complains of abdominal pain Unable to place NG Having emesis   Objective: Vital signs in last 24 hours: Temp:  [97.9 F (36.6 C)] 97.9 F (36.6 C) (11/16 0336) Pulse Rate:  [96-123] 116 (11/16 0336) Resp:  [14-38] 14 (11/16 0336) BP: (90-129)/(69-91) 101/70 (11/16 0336) SpO2:  [94 %-99 %] 97 % (11/16 0336) Weight:  [39.3 kg] 39.3 kg (11/16 0336) Last BM Date: 08/04/19  Intake/Output from previous day: 11/15 0701 - 11/16 0700 In: 1013.5 [I.V.:513.5; IV Piggyback:500] Out: 0  Intake/Output this shift: No intake/output data recorded.  Exam: Appears frail Awake and alert Abdomen distended, mild tenderness throughout  Lab Results:  Recent Labs    08/13/2019 1719 07/27/2019 0537  WBC 4.1 6.3  HGB 13.4 13.4  HCT 39.7 38.5  PLT 256 246   BMET Recent Labs    07/26/2019 1719 08/08/2019 0537  NA 141 141  K 3.7 5.5*  CL 103 104  CO2 18* 17*  GLUCOSE 149* 191*  BUN 52* 74*  CREATININE 1.85* 2.45*  CALCIUM 9.1 8.4*   PT/INR No results for input(s): LABPROT, INR in the last 72 hours. ABG No results for input(s): PHART, HCO3 in the last 72 hours.  Invalid input(s): PCO2, PO2  Studies/Results: Ct Abdomen Pelvis Wo Contrast  Result Date: 08/02/2019 CLINICAL DATA:  85 year old female with generalized abdominal pain and nausea since yesterday. EXAM: CT ABDOMEN AND PELVIS WITHOUT CONTRAST TECHNIQUE: Multidetector CT imaging of the abdomen and pelvis was performed following the standard protocol without IV contrast. COMPARISON:  Report of CT Abdomen and Pelvis 05/24/2001 (no images available). FINDINGS: Lower chest: Negative; minimal right lung base atelectasis or scarring. No cardiomegaly, pericardial or pleural effusion. Hepatobiliary: Possible small round nonspecific 25 millimeter hypodense lesion of the lateral left hepatic lobe on coronal image 41. Otherwise negative noncontrast liver and gallbladder. Pancreas: Difficult to delineate, grossly  negative. Spleen: Diminutive, negative. Adrenals/Urinary Tract: Adrenal glands are difficult to identified. Bilateral nephrolithiasis including a bulky 8-9 millimeter left upper pole calculus. But no hydronephrosis or perinephric stranding. The ureters are difficult to delineate. The urinary bladder seems to be completely decompressed. Stomach/Bowel: Diffusely fluid-filled dilated bowel loops throughout the abdomen and pelvis. Evidence of a decompressed rectum and a decompressed sigmoid colon along the left pelvic sidewall. In the left lower quadrant there is a small volume of what appears to be large bowel gas at the junction of the descending and sigmoid colon (coronal image 59). However, upstream of this the descending colon is indistinct and then the splenic flexure is dilated and fluid-filled (series 3, image 18). Dilated mostly fluid containing bowel loops upstream from that point all the way towards the proximal jejunum. Both the stomach and duodenum seem to be decompressed. A small to moderate gastric hiatal hernia is suspected. No definite pneumoperitoneum.  No definite free fluid. Vascular/Lymphatic: Vascular patency is not evaluated in the absence of IV contrast. Extensive Aortoiliac calcified atherosclerosis. Reproductive: Not identified, probably surgically absent. Other: No definite pelvic free fluid. Musculoskeletal: Osteopenia. No acute osseous abnormality identified. IMPRESSION: 1. High-grade bowel obstruction which appears to be at the level of the descending colon. The bowel in the region of the transition is poorly delineated in the absence of contrast, but in this age group consider an obstructing tumor. The rectum and sigmoid seem to be completely decompressed arguing against a distal large bowel volvulus. A repeat study with contrast would be valuable when feasible. 2. No definite free air or free fluid. 3. Suspected nonspecific  2.5 cm hypodense lesion in the left hepatic lobe. 4. Small hiatal  hernia. Nephrolithiasis without obstructive uropathy. 5.  Aortic Atherosclerosis (ICD10-I70.0). Electronically Signed   By: Genevie Ann M.D.   On: 08/08/2019 19:57   Dg Abd Portable 1 View  Result Date: 08/20/2019 CLINICAL DATA:  85 year old female NG tube placement. EXAM: PORTABLE ABDOMEN - 1 VIEW COMPARISON:  Portable chest 0126 hours. FINDINGS: AP view at 0246 hours. Similar to the prior portable chest, and enteric tube courses to the proximal stomach but then is looped back upon itself continuing retrograde back up the esophagus. The tip is not included on this image. Stable visible lung bases and upper abdomen otherwise. Stable bowel gas pattern. IMPRESSION: Enteric tube again looped upon itself at the level of the proximal stomach, and extending retrograde back in the esophagus. Technologist reports the RN removed the tube after this image was obtained. Electronically Signed   By: Genevie Ann M.D.   On: 08/20/2019 03:22   Dg Abd Portable 1 View  Result Date: 07/27/2019 CLINICAL DATA:  NG tube EXAM: PORTABLE ABDOMEN - 1 VIEW COMPARISON:  CT earlier today FINDINGS: The NG tube coils in the fundus of the stomach, then reentered the esophagus. The tip of the NG tube is in the upper thoracic esophagus. Dilated bowel loops noted in the upper abdomen. Visualized lungs clear. IMPRESSION: NG tube coils in the fundus of the stomach, and re-entered the esophagus with the tip in the upper thoracic esophagus. Electronically Signed   By: Rolm Baptise M.D.   On: 08/08/2019 01:42    Anti-infectives: Anti-infectives (From admission, onward)   None      Assessment/Plan:  Large bowel obstruction  Need exploratory laparotomy with partial colectomy and colostomy today.  Etiology likely malignancy or diverticular stricture. I discussed the risks which include but is not limited to bleeding, infection, injury to surrounding structures, the need for further procedures, cardiopulmonary issues, prolonged intubation,  etc. She agrees to proceed.   LOS: 1 day    Coralie Keens 07/30/2019

## 2019-08-07 NOTE — Progress Notes (Signed)
PROGRESS NOTE    Alison Ruiz  [id number] DOB: Mar 12, 1931 DOA: 07/28/2019 PCP: Alison Ruiz   Brief Narrative:  HPI: Alison Ruiz is a 85 y.o. female with medical history significant of HIV disease on antiretroviral therapy with last viral load of 103 quantitative on July 05, 2019 and CD4 count of 55 on the same day, also history of acute kidney injury presenting with nausea vomiting and abdominal pain.  Patient was evaluated in the ER and found to have high-grade small bowel obstruction.  Surgery consulted.  Patient may require surgical intervention but at this point trial of conservative measures recommended by surgery.  She will be admitted to the medical service.  No fever or chills no hematemesis or melena no bright red blood per rectum.  Patient has apparently been compliant with her medications.  She denied any prior abdominal surgeries..  ED Course: Temperature is 97.9 blood pressure 90/70 pulse 123 respiratory of 27 oxygen sats 96% on room air.  Sodium is 141 potassium 3.7 chloride 103 CO2 of 18 glucose 149 BUN 52 creatinine 1.85.  CBC is entirely within normal.  Glucose 149.  Urinalysis is negative.  COVID-19 screen is currently pending.  CT abdomen pelvis showed high-grade bowel obstruction at the level of the descending colon there is poor delineation in the absence of contrast.  The rectosigmoid seem to be completely decompressed so less likely to be a distal large bowel volvulus.  Surgery was consulted and patient will be admitted to the hospital for further treatment  Assessment & Plan:   Principal Problem:   SBO (small bowel obstruction) (HCC) Active Problems:   Human immunodeficiency virus (HIV) disease ([place])   Anxiety state   Dyslipidemia   S/P partial colectomy  Large bowel obstruction: Was initially admitted to hospital service.  Surgery was consulted.  Underwent partial colectomy and colostomy with exploratory laparotomy today by Dr. Ninfa Linden.   General surgery assumes care as primary attending.  Hospital service will continue to follow as consulting service.  HIV disease: Patient appears to have ARDS.  She has CD4 count of only 55 about a month ago.  She is however on her rectal viral medications.  We will continue with that and defer to infectious disease.  anxiety disorder: Resume home medications.  hyperlipidemia: Continue home regimen  acute kidney injury:  Likely secondary to rehydration/obstruction.  IV fluids.   DVT prophylaxis: Per primary service Code Status: Full code Family Communication:  None present at bedside.  Disposition Plan: Per primary service  Estimated body mass index is 15.85 kg/m as calculated from the following:   Height as of this encounter: 5\' 2"  (1.575 m).   Weight as of this encounter: 39.3 kg.      Nutritional status:               Consultants:   TRH  Procedures:   Exploratory laparotomy with partial colectomy and colostomy  Antimicrobials:       Subjective: Patient seen and examined earlier after I received a page from RN that patient had a large vomiting which was brown in color.  Patient was cramped in the bed.  Complained of some nausea and some abdominal pain.  She was alert and oriented though.  No other complaint.  Objective: Vitals:   07/29/2019 1140 08/15/2019 1145 08/06/2019 1150 07/23/2019 1155  BP:      Pulse:      Resp: (!) 39 (!) 29 (!) 31 (!) 40  Temp:  TempSrc:      SpO2:      Weight:      Height:        Intake/Output Summary (Last 24 hours) at 08/21/2019 1310 Last data filed at 08/10/2019 1300 Gross per 24 hour  Intake 1323.47 ml  Output 310 ml  Net 1013.47 ml   Filed Weights   08/16/2019 0336  Weight: 39.3 kg    Examination:  General exam: Appears sick Respiratory system: Clear to auscultation. Respiratory effort normal. Cardiovascular system: S1 & S2 heard, RRR. No JVD, murmurs, rubs, gallops or clicks. No pedal  edema. Gastrointestinal system: Abdomen is severely distended, firm and generalized tender. No organomegaly or masses felt. Normal bowel sounds heard. Central nervous system: Alert and oriented. No focal neurological deficits. Extremities: Symmetric 5 x 5 power. Skin: No rashes, lesions or ulcers Psychiatry: Judgement and insight appear normal. Mood & affect poor   Data Reviewed: I have personally reviewed following labs and imaging studies  CBC: Recent Labs  Lab 07/27/2019 1719 08/10/2019 0537  WBC 4.1 6.3  HGB 13.4 13.4  HCT 39.7 38.5  MCV 92.1 90.4  PLT 256 0000000   Basic Metabolic Panel: Recent Labs  Lab 07/25/2019 1719 08/12/2019 0537  NA 141 141  K 3.7 5.5*  CL 103 104  CO2 18* 17*  GLUCOSE 149* 191*  BUN 52* 74*  CREATININE 1.85* 2.45*  CALCIUM 9.1 8.4*   GFR: Estimated Creatinine Clearance: 10.4 mL/min (A) (by C-G formula based on SCr of 2.45 mg/dL (H)). Liver Function Tests: Recent Labs  Lab 07/29/2019 1719 08/07/19 0537  AST 19 28  ALT 14 15  ALKPHOS 67 63  BILITOT 1.6* 2.1*  PROT 7.7 6.8  ALBUMIN 3.7 3.4*   Recent Labs  Lab 08/08/2019 1719  LIPASE 13   No results for input(s): AMMONIA in the last 168 hours. Coagulation Profile: No results for input(s): INR, PROTIME in the last 168 hours. Cardiac Enzymes: No results for input(s): CKTOTAL, CKMB, CKMBINDEX, TROPONINI in the last 168 hours. BNP (last 3 results) No results for input(s): PROBNP in the last 8760 hours. HbA1C: No results for input(s): HGBA1C in the last 72 hours. CBG: No results for input(s): GLUCAP in the last 168 hours. Lipid Profile: No results for input(s): CHOL, HDL, LDLCALC, TRIG, CHOLHDL, LDLDIRECT in the last 72 hours. Thyroid Function Tests: No results for input(s): TSH, T4TOTAL, FREET4, T3FREE, THYROIDAB in the last 72 hours. Anemia Panel: No results for input(s): VITAMINB12, FOLATE, FERRITIN, TIBC, IRON, RETICCTPCT in the last 72 hours. Sepsis Labs: Recent Labs  Lab  08/03/2019 1830  LATICACIDVEN 3.2*    Recent Results (from the past 240 hour(s))  SARS CORONAVIRUS 2 (TAT 6-24 HRS) Nasopharyngeal Nasopharyngeal Swab     Status: None   Collection Time: 08/10/2019  9:25 PM   Specimen: Nasopharyngeal Swab  Result Value Ref Range Status   SARS Coronavirus 2 NEGATIVE NEGATIVE Final    Comment: (NOTE) SARS-CoV-2 target nucleic acids are NOT DETECTED. The SARS-CoV-2 RNA is generally detectable in upper and lower respiratory specimens during the acute phase of infection. Negative results do not preclude SARS-CoV-2 infection, do not rule out co-infections with other pathogens, and should not be used as the sole basis for treatment or other patient management decisions. Negative results must be combined with clinical observations, patient history, and epidemiological information. The expected result is Negative. Fact Sheet for Patients: SugarRoll.be Fact Sheet for Healthcare Providers: https://www.woods-mathews.com/ This test is not yet approved or cleared by the [place]  States FDA and  has been authorized for detection and/or diagnosis of SARS-CoV-2 by FDA under an Emergency Use Authorization (EUA). This EUA will remain  in effect (meaning this test can be used) for the duration of the COVID-19 declaration under Section 56 4(b)(1) of the Act, 21 U.S.C. section 360bbb-3(b)(1), unless the authorization is terminated or revoked sooner. Performed at [facility], [place] [address] [place], [place] [address]   Surgical pcr screen     Status: None   Collection Time: 07/31/2019  8:23 AM   Specimen: Nasal Mucosa; Nasal Swab  Result Value Ref Range Status   MRSA, PCR NEGATIVE NEGATIVE Final   Staphylococcus aureus NEGATIVE NEGATIVE Final    Comment: (NOTE) The Xpert SA Assay (FDA approved for NASAL specimens in patients 93 years of age and older), is one component of a comprehensive surveillance program. It is not  intended to diagnose infection nor to guide or monitor treatment. Performed at [facility], [place] [address], [place], [place] [address]       Radiology Studies: Ct Abdomen Pelvis Wo Contrast  Result Date: 08/11/2019 CLINICAL DATA:  85 year old female with generalized abdominal pain and nausea since yesterday. EXAM: CT ABDOMEN AND PELVIS WITHOUT CONTRAST TECHNIQUE: Multidetector CT imaging of the abdomen and pelvis was performed following the standard protocol without IV contrast. COMPARISON:  Report of CT Abdomen and Pelvis 05/24/2001 (no images available). FINDINGS: Lower chest: Negative; minimal right lung base atelectasis or scarring. No cardiomegaly, pericardial or pleural effusion. Hepatobiliary: Possible small round nonspecific 25 millimeter hypodense lesion of the lateral left hepatic lobe on coronal image 41. Otherwise negative noncontrast liver and gallbladder. Pancreas: Difficult to delineate, grossly negative. Spleen: Diminutive, negative. Adrenals/Urinary Tract: Adrenal glands are difficult to identified. Bilateral nephrolithiasis including a bulky 8-9 millimeter left upper pole calculus. But no hydronephrosis or perinephric stranding. The ureters are difficult to delineate. The urinary bladder seems to be completely decompressed. Stomach/Bowel: Diffusely fluid-filled dilated bowel loops throughout the abdomen and pelvis. Evidence of a decompressed rectum and a decompressed sigmoid colon along the left pelvic sidewall. In the left lower quadrant there is a small volume of what appears to be large bowel gas at the junction of the descending and sigmoid colon (coronal image 59). However, upstream of this the descending colon is indistinct and then the splenic flexure is dilated and fluid-filled (series 3, image 18). Dilated mostly fluid containing bowel loops upstream from that point all the way towards the proximal jejunum. Both the stomach and duodenum seem to be decompressed. A small  to moderate gastric hiatal hernia is suspected. No definite pneumoperitoneum.  No definite free fluid. Vascular/Lymphatic: Vascular patency is not evaluated in the absence of IV contrast. Extensive Aortoiliac calcified atherosclerosis. Reproductive: Not identified, probably surgically absent. Other: No definite pelvic free fluid. Musculoskeletal: Osteopenia. No acute osseous abnormality identified. IMPRESSION: 1. High-grade bowel obstruction which appears to be at the level of the descending colon. The bowel in the region of the transition is poorly delineated in the absence of contrast, but in this age group consider an obstructing tumor. The rectum and sigmoid seem to be completely decompressed arguing against a distal large bowel volvulus. A repeat study with contrast would be valuable when feasible. 2. No definite free air or free fluid. 3. Suspected nonspecific 2.5 cm hypodense lesion in the left hepatic lobe. 4. Small hiatal hernia. Nephrolithiasis without obstructive uropathy. 5.  Aortic Atherosclerosis (ICD10-I70.0). Electronically Signed   By: Herminio Heads.D.  On: 08/13/2019 19:57   Dg Abd Portable 1 View  Result Date: 08/03/2019 CLINICAL DATA:  85 year old female NG tube placement. EXAM: PORTABLE ABDOMEN - 1 VIEW COMPARISON:  Portable chest 0126 hours. FINDINGS: AP view at 0246 hours. Similar to the prior portable chest, and enteric tube courses to the proximal stomach but then is looped back upon itself continuing retrograde back up the esophagus. The tip is not included on this image. Stable visible lung bases and upper abdomen otherwise. Stable bowel gas pattern. IMPRESSION: Enteric tube again looped upon itself at the level of the proximal stomach, and extending retrograde back in the esophagus. Technologist reports the RN removed the tube after this image was obtained. Electronically Signed   By: Genevie Ann M.D.   On: 08/18/2019 03:22   Dg Abd Portable 1 View  Result Date: 07/24/2019 CLINICAL  DATA:  NG tube EXAM: PORTABLE ABDOMEN - 1 VIEW COMPARISON:  CT earlier today FINDINGS: The NG tube coils in the fundus of the stomach, then reentered the esophagus. The tip of the NG tube is in the upper thoracic esophagus. Dilated bowel loops noted in the upper abdomen. Visualized lungs clear. IMPRESSION: NG tube coils in the fundus of the stomach, and re-entered the esophagus with the tip in the upper thoracic esophagus. Electronically Signed   By: Rolm Baptise M.D.   On: 08/20/2019 01:42    Scheduled Meds:  [MAR Hold] heparin  5,000 Units Subcutaneous Q8H   Continuous Infusions:  dextrose 5 % and 0.9% NaCl       LOS: 1 day   Time spent: 30 minutes   Darliss Cheney, Ruiz [facility]  07/28/2019, 1:10 PM   To contact the attending provider between 7A-7P or the covering provider during after hours 7P-7A, please log into the web site [email or web address] and use password [email or web address].

## 2019-08-07 NOTE — Progress Notes (Signed)
Jeani Hawking, CRNA called for IV assistance. Dr. Cherene Altes made aware that unable to obtain additional IV access.

## 2019-08-07 NOTE — Anesthesia Preprocedure Evaluation (Signed)
Anesthesia Evaluation  Patient identified by MRN, date of birth, ID band Patient awake    Reviewed: Allergy & Precautions, H&P , NPO status , Patient's Chart, lab work & pertinent test results  Airway Mallampati: II   Neck ROM: full    Dental   Pulmonary neg pulmonary ROS,    breath sounds clear to auscultation       Cardiovascular negative cardio ROS   Rhythm:regular Rate:Tachycardia     Neuro/Psych PSYCHIATRIC DISORDERS Anxiety Depression    GI/Hepatic Large bowel obstruction.   Endo/Other    Renal/GU Renal InsufficiencyRenal disease     Musculoskeletal   Abdominal   Peds  Hematology  (+) HIV,   Anesthesia Other Findings   Reproductive/Obstetrics                             Anesthesia Physical Anesthesia Plan  ASA: III  Anesthesia Plan: General   Post-op Pain Management:    Induction: Intravenous  PONV Risk Score and Plan: 3 and Ondansetron, Dexamethasone and Treatment may vary due to age or medical condition  Airway Management Planned: Oral ETT  Additional Equipment:   Intra-op Plan:   Post-operative Plan: Possible Post-op intubation/ventilation  Informed Consent: I have reviewed the patients History and Physical, chart, labs and discussed the procedure including the risks, benefits and alternatives for the proposed anesthesia with the patient or authorized representative who has indicated his/her understanding and acceptance.       Plan Discussed with: CRNA, Anesthesiologist and Surgeon  Anesthesia Plan Comments:         Anesthesia Quick Evaluation

## 2019-08-07 NOTE — Progress Notes (Signed)
Received from ED via stretcher. VS, assessment as charted. No family with patient. Placed in bed,.tele applied.

## 2019-08-07 NOTE — Anesthesia Procedure Notes (Signed)
Procedure Name: Intubation Date/Time: 08/10/2019 12:20 PM Performed by: Alain Marion, CRNA Pre-anesthesia Checklist: Patient identified, Emergency Drugs available, Suction available and Patient being monitored Patient Re-evaluated:Patient Re-evaluated prior to induction Oxygen Delivery Method: Circle System Utilized Preoxygenation: Pre-oxygenation with 100% oxygen Induction Type: IV induction, Rapid sequence and Cricoid Pressure applied Laryngoscope Size: Mac and 3 Grade View: Grade I Tube type: Oral Tube size: 6.5 mm Number of attempts: 1 Airway Equipment and Method: Stylet Placement Confirmation: ETT inserted through vocal cords under direct vision,  positive ETCO2 and breath sounds checked- equal and bilateral Secured at: 20 cm Tube secured with: Tape Dental Injury: Teeth and Oropharynx as per pre-operative assessment

## 2019-08-07 NOTE — Op Note (Signed)
PARTIAL COLECTOMY AND COLOSTOMY, Exploratory Laparotomy  Procedure Note  Alison Ruiz 07/24/2019   Pre-op Diagnosis: large bowel obstruction     Post-op Diagnosis: same  Procedure(s): PARTIAL COLECTOMY AND COLOSTOMY Exploratory Laparotomy  Surgeon(s): Coralie Keens, MD   Assist:  Brigid Re, PA  Anesthesia: General  Staff:  Circulator: Cyd Silence, RN Physician Assistant: [name], PA-C Scrub Person: Small, Benjaman Lobe, RN  Estimated Blood Loss: Minimal               Specimens: sent to path  Indications: This is an 85 year old female presented with nausea, vomiting, and abdominal pain.  She was found on a CT scan to have findings consistent with a large bowel obstruction in the descending colon.  The decision is made to proceed to the operating room for an exploratory laparotomy  Findings: The patient appeared to have an obstructing mass in the mid descending colon.  This area was resected and a end colostomy was performed  Procedure: The patient was brought to the operating room identifies correct patient.  She is placed upon the operating table and general anesthesia was induced.  It was suspected she may have aspirated on induction.  Her abdomen was prepped and draped in usual sterile fashion.  I created midline incision with a scalpel and took this down through the fascia with electrocautery.  The peritoneum was then opened entire length of the incision.  The patient had dilated loops of small bowel and colon.  I easily identified a complete obstruction of the descending colon with what appeared to be a mass.  I mobilized the descending colon along the white line of Toldt with cautery.  I then transected the colon proximal and distal to the obstructing mass with a GIA-75 stapler.  I then took down the mesentery with the LigaSure.  The specimen was sent to pathology for evaluation.  I then made an elliptical incision the patient's left upper quadrant with a  scalpel.  I took this down to the fascia which was opened in a cruciate fashion.  The peritoneum was then opened the muscle fibers above this were separated.  We then pulled out the proximal descending colon as an end colostomy.  No other apparent intra-abdominal pathology was identified.  I then closed the patient midline fascia with a running #1 looped PDS suture.  The skin was left open.  The staple line of the colostomy was taken down with the cautery and the colostomy was matured circumferentially with interrupted 3-0 Vicryl sutures.  An ostomy appliance was then applied.  The midline wound was packed with wet-to-dry saline gauze.  The patient appeared to tolerate the procedure.  The decision was made to leave the patient intubated postoperatively.  All sponge, needle, and instrument counts were correct of the procedure.  The patient was then taken in a guarded condition from the operating room to the intensive care unit.          Coralie Keens   Date: 08/02/2019  Time: 1:21 PM

## 2019-08-07 NOTE — Progress Notes (Signed)
CRITICAL VALUE ALERT  Critical Value:  Lactic Acid 6.8  Date & Time Notied:  08/01/2019 @ [id number]  Provider Notified: Kennieth Rad, NP  Orders Received/Actions taken:  1L bolus of LR

## 2019-08-07 NOTE — Progress Notes (Signed)
CRITICAL VALUE ALERT  Critical Value: PH 7.037  Date & Time Notied:  08/08/2019 1740  Provider Notified: Erskine Emery, MD  Orders Received/Actions taken:  MD put in orders

## 2019-08-07 NOTE — Progress Notes (Signed)
[facility] Progress Note Patient Name: Alison Ruiz DOB: 1931-06-13 MRN: [id number]   Date of Service  07/24/2019  HPI/Events of Note  Persistent  Tachycardia and hypotension s/p ex-lap, lactate also elevated.  eICU Interventions  Albumin 25 % 25 gm iv x 1        [place]wo U Ogan 08/14/2019, 9:28 PM

## 2019-08-07 NOTE — Consult Note (Signed)
NAME:  Donni Borde, MRN:  [id number], DOB:  04-Dec-1930, LOS: 1 ADMISSION DATE:  07/28/2019, CONSULTATION DATE:  08/11/2019 REFERRING MD:  Dr. Ninfa Linden, CHIEF COMPLAINT:  abd pain/ N/V   Brief History   85 year old female with significant hx of HIV presenting with abdominal pain, nausea, and vomiting found to have large bowel obstruction in the descending colon.  Failed medical management, ongoing nausea/ vomiting and noted to having pooling of gastric secretions during RSI prior to going to OR on 11/16 for partial colectomy and colostomy.  Returns to ICU sedated and intubated.   History of present illness   HPI obtained from medical chart review as patient is sedated/ intubated on mechanical ventilator.    85 year old female with prior history as below presenting from home with five day history of nausea, vomiting, and abdominal pain.  Patient apparently lives alone.  Found to have AKI and CT scan to have findings consistent with large bowel obstruction in the descending colon.  She [facility] however failed medical management.  NGT unable to be placement with ongoing emesis.  At time of RSI in OR, noted to having pooling of gastric secretions in posterior pharynx.  Taken to OR on 11/16 for exploratory laparotomy with partial colectomy and colostomy; fasica was closed, skin remains open.  Post-operatively returns to ICU sedated on mechanical ventilation.   Past Medical History  Never smoker HIV/ hep B, anxiety, normocytic anemia, protein calorie malnutrition, LBBB/ syncope/ bradycardia, HLD,   Significant Hospital Events   11/15 Admitted [facility] 11/16 OR  Consults:  CCS  Procedures:  11/16 ETT >> 11/16 R CVL IJ >> 11/16 OR ->ex lap, partial colectomy and colostomy   Significant Diagnostic Tests:  11/15 CT abd/ pelvis >> 1. High-grade bowel obstruction which appears to be at the level of the descending colon. The bowel in the region of the transition is poorly delineated in the  absence of contrast, but in this age group consider an obstructing tumor.  The rectum and sigmoid seem to be completely decompressed arguing against a distal large bowel volvulus.  A repeat study with contrast would be valuable when feasible. 2. No definite free air or free fluid. 3. Suspected nonspecific 2.5 cm hypodense lesion in the left hepatic lobe. 4. Small hiatal hernia. Nephrolithiasis without obstructive uropathy. 5.  Aortic Atherosclerosis   Micro Data:  11/15 SARS CoV2 >> neg 11/16 MRSA PCR >> neg  Antimicrobials:  11/16 cefotetan preop 11/16 unasyn >>  Interim history/subjective:  Arrived on low dose neosynephrine and propofol Last paralytics around 1330 per CRNA  Objective   Blood pressure 102/74, pulse (!) 102, temperature 98 F (36.7 C), temperature source Oral, resp. rate (!) 40, height 5\' 2"  (1.575 m), weight 39.3 kg, SpO2 98 %.        Intake/Output Summary (Last 24 hours) at 07/28/2019 1340 Last data filed at 08/15/2019 1337 Gross per 24 hour  Intake 1623.47 ml  Output 1060 ml  Net 563.47 ml   Filed Weights   07/30/2019 0336  Weight: 39.3 kg    Examination: General:  Chronically frail and cachectic elderly female sedated on MV HEENT: MM pink/moist, ETT 6.5 at 18 at lip, NGT, pupils 2/reactive Neuro: sedated/ paralyzed  CV: RR, + murmur PULM:  MV supported breaths, plat 15, PIP 21, clear anteriorly, diminished in bases, no wheeze GI: colostomy left side, midline dressing intact cdi, foley c/ amber urine Extremities: cool/dry, no edema  Skin: no rashes Resolved  Hospital Problem list    Assessment & Plan:    Acute respiratory insufficiency Suspected aspiration  P:  Full MV support, PRVC 8 cc/ kg Wean FiO2/ PEEP  CXR and ABG now VAP bundle Send trach asp Empiric unasyn  PCT not useful given chronic antivirals PAD protocol with fentanyl prn/ precedex   Hypotension - prior TTE 01/20/2019 EF 45-50, mildly reduced RV systolic function, mild/mod TR,  myocardium of LV suspicious for noncompaction P:  No significant EBL, could be hypovolemia vs sedation related vs less likely infection Albumin x 2 in OR MAP goal >65, levophed prn Additional LR bolus now then MIVF Check CVP, lactate Trend UOP CBC now   Large bowel obstruction in descending colon s/p partial colectomy and colostomy 11/16 P:  Per CCS No significant EBL, fascia closed, skin open- dressing changes per CCS NGT to ILWS Follow surgical biopsy    AKI related to dehydration/ N/V AGMA/ lactic acidosis Mild hyperkalemia  P:  Recheck renal panel/ mag now Foley Trend BMP / urinary output Replace electrolytes as indicated Avoid nephrotoxic agents, ensure adequate renal perfusion   Hyperglycemia P: Check A1c SSI sensitive    HIV  - followed by Dr. Megan Salon - 07/05/2019 CD4 55, RNA 103  P:  Holding home darunavir- cobicisctat- emtricitabine- tenofovir- alafenamide   Best practice:  Diet: NPO Pain/Anxiety/Delirium protocol (if indicated): RASS goal 0-/1 VAP protocol (if indicated): yes DVT prophylaxis: SCDs, heparin when CCS ok  GI prophylaxis: PPI Glucose control: SSI Mobility: BR Code Status: Full  Family Communication: pending Disposition: ICU  Labs   CBC: Recent Labs  Lab 08/17/2019 1719 08/18/2019 0537  WBC 4.1 6.3  HGB 13.4 13.4  HCT 39.7 38.5  MCV 92.1 90.4  PLT 256 0000000    Basic Metabolic Panel: Recent Labs  Lab 07/27/2019 1719 07/25/2019 0537  NA 141 141  K 3.7 5.5*  CL 103 104  CO2 18* 17*  GLUCOSE 149* 191*  BUN 52* 74*  CREATININE 1.85* 2.45*  CALCIUM 9.1 8.4*   GFR: Estimated Creatinine Clearance: 10.4 mL/min (A) (by C-G formula based on SCr of 2.45 mg/dL (H)). Recent Labs  Lab 08/21/2019 1719 08/14/2019 1830 08/08/2019 0537  WBC 4.1  --  6.3  LATICACIDVEN  --  3.2*  --     Liver Function Tests: Recent Labs  Lab 08/08/2019 1719 07/31/2019 0537  AST 19 28  ALT 14 15  ALKPHOS 67 63  BILITOT 1.6* 2.1*  PROT 7.7 6.8  ALBUMIN  3.7 3.4*   Recent Labs  Lab 08/03/2019 1719  LIPASE 13   No results for input(s): AMMONIA in the last 168 hours.  ABG    Component Value Date/Time   PHART 7.476 (H) 01/21/2019 1345   PCO2ART 31.2 (L) 01/21/2019 1345   PO2ART 60.2 (L) 01/21/2019 1345   HCO3 22.7 01/21/2019 1345   ACIDBASEDEF 0.5 01/21/2019 1345   O2SAT 89.5 01/21/2019 1345     Coagulation Profile: No results for input(s): INR, PROTIME in the last 168 hours.  Cardiac Enzymes: No results for input(s): CKTOTAL, CKMB, CKMBINDEX, TROPONINI in the last 168 hours.  HbA1C: Hgb A1c MFr Bld  Date/Time Value Ref Range Status  01/17/2019 07:13 PM 6.5 (H) 4.8 - 5.6 % Final    Comment:    (NOTE) Pre diabetes:          5.7%-6.4% Diabetes:              >6.4% Glycemic control for   <7.0% adults with diabetes  CBG: No results for input(s): GLUCAP in the last 168 hours.  Review of Systems:   Unable as patient is sedated on mechanical ventilation.  Past Medical History  She,  has a past medical history of Abnormal serum level of alkaline phosphatase (01/18/2019), AKI (acute kidney injury) ([place]) (12/2018), GRIEF REACTION, ACUTE (05/24/2008), HIV (human immunodeficiency virus infection) ([place]), and Loss of consciousness ([place]) (12/2018).   Surgical History    Past Surgical History:  Procedure Laterality Date  . ABDOMINAL HYSTERECTOMY       Social History   reports that she has never smoked. She has never used smokeless tobacco. She reports that she does not drink alcohol or use drugs.   Family History   Her family history is not on file.   Allergies No Known Allergies   Home Medications  Prior to Admission medications   Medication Sig Start Date End Date Taking? Authorizing Provider  acetaminophen (TYLENOL) 325 MG tablet Take 325 mg by mouth every 6 (six) hours as needed (for pain).    Yes [provider]  Darunavir-Cobicisctat-Emtricitabine-Tenofovir Alafenamide (SYMTUZA) 800-150-200-10 MG TABS Take  1 tablet by mouth daily with breakfast. 02/20/19  Yes Michel Bickers, MD  dolutegravir (TIVICAY) 50 MG tablet Take 1 tablet (50 mg total) by mouth daily. 02/20/19  Yes Michel Bickers, MD  ferrous sulfate 325 (65 FE) MG tablet Take 1 tablet (325 mg total) by mouth daily for 30 days. 02/20/19 08/12/2019 Yes Michel Bickers, MD  ondansetron (ZOFRAN) 4 MG tablet Take 4 mg by mouth every 8 (eight) hours as needed for nausea or vomiting.   Yes [provider]  sulfamethoxazole-trimethoprim (BACTRIM DS) 800-160 MG tablet Take 1 tablet by mouth daily. 02/20/19  Yes Michel Bickers, MD  feeding supplement, ENSURE ENLIVE, (ENSURE ENLIVE) LIQD Take 237 mLs by mouth 3 (three) times daily between meals. Patient taking differently: Take 1 Bottle by mouth daily.  01/20/19   Matilde Haymaker, MD     Critical care time: 40 mins      Kennieth Rad, MSN, AGACNP-BC [facility] 08/07/2019, 2:36 PM

## 2019-08-07 NOTE — Progress Notes (Signed)
Dr. Cherene Altes notified of sinus tachycardia on monitor rates in 130's.

## 2019-08-07 NOTE — Progress Notes (Signed)
Initial Nutrition Assessment  DOCUMENTATION CODES:   Severe malnutrition in context of chronic illness, Underweight  INTERVENTION:   Once diet advanced:    Ensure Enlive po BID, each supplement provides 350 kcal and 20 grams of protein  Magic cup TID with meals, each supplement provides 290 kcal and 9 grams of protein  MVI daily   NUTRITION DIAGNOSIS:   Severe Malnutrition related to chronic illness(HIV) as evidenced by severe fat depletion, severe muscle depletion.  GOAL:   Patient will meet greater than or equal to 90% of their needs  MONITOR:   PO intake, Supplement acceptance, Diet advancement, Weight trends, Labs, I & O's  REASON FOR ASSESSMENT:   Malnutrition Screening Tool    ASSESSMENT:   Patient with PMH significant for HIV. Presents this admission with bowl obstruction.   11/16- partial colectomy with colostomy   Spoke with pt prior to surgery. Observed vomit in bed at time of visit. Pt unable to answer all questions due to pain. Reports having a loss in appetite 5 days PTA due to ongoing nausea/vomiting. States during this time she attempted to consume softer foods like apple sauce but had a hard time tolerating. Unable to provide history prior to this time, suspect intake has been poor for prolonged period. Will provide supplements to maximize kcal and protein post surgery.   Pt endorses a UBW of 100 lb and an unintentional weight loss of 20 lb. Records indicate pt weighed 88 lb on 10/28 and 87 ln this admission (insigificant for time frame).   I/O: +864 ml since admit  UOP: 60 ml x 24 hrs Emesis: 200 ml x 24 hrs    Drips: D5 @ 100 ml/hr  Labs: K 5.5 (H) CBG 149-191   NUTRITION - FOCUSED PHYSICAL EXAM:    Most Recent Value  Orbital Region  Moderate depletion  Upper Arm Region  Severe depletion  Thoracic and Lumbar Region  Unable to assess  Buccal Region  Severe depletion  Temple Region  Severe depletion  Clavicle Bone Region  Severe depletion   Clavicle and Acromion Bone Region  Severe depletion  Scapular Bone Region  Unable to assess  Dorsal Hand  Severe depletion  Patellar Region  Severe depletion  Anterior Thigh Region  Severe depletion  Posterior Calf Region  Severe depletion  Edema (RD Assessment)  None  Hair  Reviewed  Eyes  Reviewed  Mouth  Reviewed  Skin  Reviewed  Nails  Reviewed     Diet Order:   Diet Order            Diet NPO time specified  Diet effective now              EDUCATION NEEDS:   Not appropriate for education at this time  Skin:  Skin Assessment: Skin Integrity Issues: Skin Integrity Issues:: Incisions Incisions: closed abdomen  Last BM:  11/16  Height:   Ht Readings from Last 1 Encounters:  08/17/2019 5\' 2"  (1.575 m)    Weight:   Wt Readings from Last 1 Encounters:  08/01/2019 39.3 kg    Ideal Body Weight:  50 kg  BMI:  Body mass index is 15.85 kg/m.  Estimated Nutritional Needs:   Kcal:  1300-1500 kcal  Protein:  65-80 grams  Fluid:  >/= 1.3 L/day   Mariana Single RD, LDN Clinical Nutrition Pager # - [phone]

## 2019-08-07 NOTE — Consult Note (Signed)
[facility] Nurse ostomy consult note  [facility] Nurse requested for preoperative stoma site marking by Dr. Evlyn Courier.    Not able to discussed surgical procedure and stoma creation with patient due to discomfort. She has recently vomited.   Examined patient lying and sitting with HOB raised in order to place the marking in the patient's visual field, away from any creases or abdominal contour issues and within the patient's narrow rectus muscle.   Her abdomen is very distended.    Marked for colostomy in the LLQ  4cm to the left of the umbilicus and  2cm below the umbilicus.  Patient's abdomen cleansed with CHG wipes at site markings, allowed to air dry prior to marking.Covered mark with thin film transparent dressing to preserve mark until date of surgery, which is expected to be later today.    [facility] Nurse team will follow up with patient after surgery for continue ostomy care and teaching.   Thank you for inviting [place] to preoperatively mark this patient.  Maudie Flakes, MSN, RN, [place], [facility]  Pager# [phone]

## 2019-08-07 NOTE — Progress Notes (Signed)
PCCM Interval Note   Multiple ongoing issues- ongoing hypotension with increasing requirement of levophed, metabolic/ respiratory acidosis, CVP 4, BMP noted- K 6.0, worsening,  SCr, lactate 6.8, scant UOP  P:  Additional LR bolus for CVP goal 6-8 Recheck lactate in 2 hours Insulin 10 units now/ D50- unable to give lokelma/ kayexalate  Bicarb x 2 amps and then gtt at 100 ml/hr Recheck ABG at [place] at [place], MSN, AGACNP-BC [facility] 08/06/2019, 6:50 PM

## 2019-08-08 ENCOUNTER — Inpatient Hospital Stay (HOSPITAL_COMMUNITY): Payer: Medicare Other

## 2019-08-08 ENCOUNTER — Encounter (HOSPITAL_COMMUNITY): Payer: Self-pay | Admitting: Surgery

## 2019-08-08 DIAGNOSIS — Z9049 Acquired absence of other specified parts of digestive tract: Secondary | ICD-10-CM

## 2019-08-08 DIAGNOSIS — Z9911 Dependence on respirator [ventilator] status: Secondary | ICD-10-CM

## 2019-08-08 DIAGNOSIS — R6521 Severe sepsis with septic shock: Secondary | ICD-10-CM

## 2019-08-08 DIAGNOSIS — A419 Sepsis, unspecified organism: Secondary | ICD-10-CM

## 2019-08-08 DIAGNOSIS — B20 Human immunodeficiency virus [HIV] disease: Secondary | ICD-10-CM

## 2019-08-08 LAB — POCT I-STAT 7, (LYTES, BLD GAS, ICA,H+H)
Acid-Base Excess: 9 mmol/L — ABNORMAL HIGH (ref 0.0–2.0)
Bicarbonate: 24.9 mmol/L (ref 20.0–28.0)
Bicarbonate: 32.6 mmol/L — ABNORMAL HIGH (ref 20.0–28.0)
Calcium, Ion: 0.99 mmol/L — ABNORMAL LOW (ref 1.15–1.40)
Calcium, Ion: 0.96 mmol/L — ABNORMAL LOW (ref 1.15–1.40)
HCT: 24 % — ABNORMAL LOW (ref 36.0–46.0)
HCT: 21 % — ABNORMAL LOW (ref 36.0–46.0)
Hemoglobin: 8.2 g/dL — ABNORMAL LOW (ref 12.0–15.0)
Hemoglobin: 7.1 g/dL — ABNORMAL LOW (ref 12.0–15.0)
O2 Saturation: 96 %
O2 Saturation: 99 %
Patient temperature: 98.7
Patient temperature: 97.8
Potassium: 4.7 mmol/L (ref 3.5–5.1)
Potassium: 5.1 mmol/L (ref 3.5–5.1)
Sodium: 146 mmol/L — ABNORMAL HIGH (ref 135–145)
Sodium: 148 mmol/L — ABNORMAL HIGH (ref 135–145)
TCO2: 26 mmol/L (ref 22–32)
TCO2: 34 mmol/L — ABNORMAL HIGH (ref 22–32)
pCO2 arterial: 38.2 mmHg (ref 32.0–48.0)
pCO2 arterial: 38.1 mmHg (ref 32.0–48.0)
pH, Arterial: 7.422 (ref 7.350–7.450)
pH, Arterial: 7.539 — ABNORMAL HIGH (ref 7.350–7.450)
pO2, Arterial: 79 mmHg — ABNORMAL LOW (ref 83.0–108.0)
pO2, Arterial: 106 mmHg (ref 83.0–108.0)

## 2019-08-08 LAB — CBC
HCT: 28.5 % — ABNORMAL LOW (ref 36.0–46.0)
Hemoglobin: 10.3 g/dL — ABNORMAL LOW (ref 12.0–15.0)
MCH: 31.2 pg (ref 26.0–34.0)
MCHC: 36.1 g/dL — ABNORMAL HIGH (ref 30.0–36.0)
MCV: 86.4 fL (ref 80.0–100.0)
Platelets: 71 10*3/uL — ABNORMAL LOW (ref 150–400)
RBC: 3.3 MIL/uL — ABNORMAL LOW (ref 3.87–5.11)
RDW: 13.3 % (ref 11.5–15.5)
WBC: 9.2 10*3/uL (ref 4.0–10.5)
nRBC: 6 % — ABNORMAL HIGH (ref 0.0–0.2)

## 2019-08-08 LAB — COMPREHENSIVE METABOLIC PANEL
ALT: 31 U/L (ref 0–44)
AST: 114 U/L — ABNORMAL HIGH (ref 15–41)
Albumin: 2.7 g/dL — ABNORMAL LOW (ref 3.5–5.0)
Alkaline Phosphatase: 94 U/L (ref 38–126)
Anion gap: 17 — ABNORMAL HIGH (ref 5–15)
BUN: 80 mg/dL — ABNORMAL HIGH (ref 8–23)
CO2: 22 mmol/L (ref 22–32)
Calcium: 7.5 mg/dL — ABNORMAL LOW (ref 8.9–10.3)
Chloride: 108 mmol/L (ref 98–111)
Creatinine, Ser: 2.48 mg/dL — ABNORMAL HIGH (ref 0.44–1.00)
GFR calc Af Amer: 20 mL/min — ABNORMAL LOW (ref >60)
GFR calc non Af Amer: 17 mL/min — ABNORMAL LOW (ref >60)
Glucose, Bld: 77 mg/dL (ref 70–99)
Potassium: 4.8 mmol/L (ref 3.5–5.1)
Sodium: 147 mmol/L — ABNORMAL HIGH (ref 135–145)
Total Bilirubin: 5.2 mg/dL — ABNORMAL HIGH (ref 0.3–1.2)
Total Protein: 4.5 g/dL — ABNORMAL LOW (ref 6.5–8.1)

## 2019-08-08 LAB — BASIC METABOLIC PANEL
Anion gap: 12 (ref 5–15)
BUN: 72 mg/dL — ABNORMAL HIGH (ref 8–23)
CO2: 27 mmol/L (ref 22–32)
Calcium: 7.1 mg/dL — ABNORMAL LOW (ref 8.9–10.3)
Chloride: 110 mmol/L (ref 98–111)
Creatinine, Ser: 2.27 mg/dL — ABNORMAL HIGH (ref 0.44–1.00)
GFR calc Af Amer: 22 mL/min — ABNORMAL LOW (ref >60)
GFR calc non Af Amer: 19 mL/min — ABNORMAL LOW (ref >60)
Glucose, Bld: 101 mg/dL — ABNORMAL HIGH (ref 70–99)
Potassium: 4.9 mmol/L (ref 3.5–5.1)
Sodium: 149 mmol/L — ABNORMAL HIGH (ref 135–145)

## 2019-08-08 LAB — GLUCOSE, CAPILLARY
Glucose-Capillary: 162 mg/dL — ABNORMAL HIGH (ref 70–99)
Glucose-Capillary: 60 mg/dL — ABNORMAL LOW (ref 70–99)
Glucose-Capillary: 76 mg/dL (ref 70–99)
Glucose-Capillary: 70 mg/dL (ref 70–99)
Glucose-Capillary: 88 mg/dL (ref 70–99)
Glucose-Capillary: 102 mg/dL — ABNORMAL HIGH (ref 70–99)
Glucose-Capillary: 93 mg/dL (ref 70–99)
Glucose-Capillary: 202 mg/dL — ABNORMAL HIGH (ref 70–99)
Glucose-Capillary: 78 mg/dL (ref 70–99)

## 2019-08-08 LAB — PREPARE RBC (CROSSMATCH)

## 2019-08-08 LAB — TSH: TSH: 2.184 u[IU]/mL (ref 0.350–4.500)

## 2019-08-08 LAB — LACTIC ACID, PLASMA
Lactic Acid, Venous: 5.5 mmol/L (ref 0.5–1.9)
Lactic Acid, Venous: 4 mmol/L (ref 0.5–1.9)
Lactic Acid, Venous: 4.8 mmol/L (ref 0.5–1.9)

## 2019-08-08 LAB — RENAL FUNCTION PANEL
Albumin: 2.7 g/dL — ABNORMAL LOW (ref 3.5–5.0)
Anion gap: 14 (ref 5–15)
BUN: 80 mg/dL — ABNORMAL HIGH (ref 8–23)
CO2: 22 mmol/L (ref 22–32)
Calcium: 7.6 mg/dL — ABNORMAL LOW (ref 8.9–10.3)
Chloride: 111 mmol/L (ref 98–111)
Creatinine, Ser: 2.5 mg/dL — ABNORMAL HIGH (ref 0.44–1.00)
GFR calc Af Amer: 20 mL/min — ABNORMAL LOW (ref >60)
GFR calc non Af Amer: 17 mL/min — ABNORMAL LOW (ref >60)
Glucose, Bld: 69 mg/dL — ABNORMAL LOW (ref 70–99)
Phosphorus: 3.3 mg/dL (ref 2.5–4.6)
Potassium: 5.3 mmol/L — ABNORMAL HIGH (ref 3.5–5.1)
Sodium: 147 mmol/L — ABNORMAL HIGH (ref 135–145)

## 2019-08-08 LAB — PHOSPHORUS: Phosphorus: 2.5 mg/dL (ref 2.5–4.6)

## 2019-08-08 LAB — TROPONIN I (HIGH SENSITIVITY)
Troponin I (High Sensitivity): 116 ng/L (ref <18)
Troponin I (High Sensitivity): 195 ng/L (ref <18)
Troponin I (High Sensitivity): 4121 ng/L (ref <18)

## 2019-08-08 LAB — MAGNESIUM: Magnesium: 1.6 mg/dL — ABNORMAL LOW (ref 1.7–2.4)

## 2019-08-08 MED ORDER — LACTATED RINGERS IV BOLUS
1000.0000 mL | Freq: Once | INTRAVENOUS | Status: AC
  Administered 2019-08-08: 1000 mL via INTRAVENOUS

## 2019-08-08 MED ORDER — CHLORHEXIDINE GLUCONATE 0.12% ORAL RINSE (MEDLINE KIT)
15.0000 mL | Freq: Two times a day (BID) | OROMUCOSAL | Status: DC
  Administered 2019-08-08 – 2019-08-09 (×2): 15 mL via OROMUCOSAL

## 2019-08-08 MED ORDER — SODIUM CHLORIDE 0.9% IV SOLUTION
Freq: Once | INTRAVENOUS | Status: AC
  Administered 2019-08-08: 22:00:00 via INTRAVENOUS

## 2019-08-08 MED ORDER — POLYETHYLENE GLYCOL (MIRALAX) NICU SYRINGE 0.34GM/ML: 0.5000 g/kg | ORAL | Status: DC

## 2019-08-08 MED ORDER — SODIUM CHLORIDE 0.9 % IV SOLN
1.0000 g | Freq: Once | INTRAVENOUS | Status: AC
  Administered 2019-08-08: 1 g via INTRAVENOUS
  Filled 2019-08-08: qty 10

## 2019-08-08 MED ORDER — SODIUM CHLORIDE 0.9% IV SOLUTION
Freq: Once | INTRAVENOUS | Status: AC
  Administered 2019-08-08: 03:00:00 via INTRAVENOUS

## 2019-08-08 MED ORDER — DEXTROSE 50 % IV SOLN
INTRAVENOUS | Status: AC
  Administered 2019-08-08: 50 mL
  Filled 2019-08-08: qty 50

## 2019-08-08 MED ORDER — PHENYLEPHRINE HCL-NACL 40-0.9 MG/250ML-% IV SOLN
0.0000 ug/min | INTRAVENOUS | Status: DC
  Administered 2019-08-08: 200 ug/min via INTRAVENOUS
  Administered 2019-08-08: 175 ug/min via INTRAVENOUS
  Administered 2019-08-08: 200 ug/min via INTRAVENOUS
  Administered 2019-08-08: 185 ug/min via INTRAVENOUS
  Administered 2019-08-08: 200 ug/min via INTRAVENOUS
  Administered 2019-08-08: 150 ug/min via INTRAVENOUS
  Administered 2019-08-09: 100 ug/min via INTRAVENOUS
  Administered 2019-08-09: 150 ug/min via INTRAVENOUS
  Filled 2019-08-08 (×10): qty 250

## 2019-08-08 MED ORDER — LACTATED RINGERS IV BOLUS
1000.0000 mL | Freq: Once | INTRAVENOUS | Status: AC
  Administered 2019-08-08: 20:00:00 1000 mL via INTRAVENOUS

## 2019-08-08 MED ORDER — CALCIUM CHLORIDE 10 % IV SOLN: 1.0000 g | Freq: Once | INTRAVENOUS | Status: DC

## 2019-08-08 MED ORDER — INSULIN ASPART 100 UNIT/ML ~~LOC~~ SOLN
0.0000 [IU] | Freq: Four times a day (QID) | SUBCUTANEOUS | Status: DC
  Administered 2019-08-08: 3 [IU] via SUBCUTANEOUS

## 2019-08-08 MED ORDER — DEXTROSE 5 % IV SOLN
INTRAVENOUS | Status: DC
  Administered 2019-08-08: 08:00:00 via INTRAVENOUS

## 2019-08-08 MED ORDER — DOCUSATE SODIUM 50 MG/5ML PO LIQD: 100.0000 mg | Freq: Two times a day (BID) | ORAL | Status: DC | PRN

## 2019-08-08 MED ORDER — DEXTROSE 50 % IV SOLN: 50.0000 mL | Freq: Once | INTRAVENOUS | Status: AC

## 2019-08-08 MED ORDER — SODIUM CHLORIDE 0.9 % IV SOLN
INTRAVENOUS | Status: DC | PRN
  Administered 2019-08-08 – 2019-08-09 (×3): via INTRAVENOUS

## 2019-08-08 MED ORDER — THIAMINE HCL 100 MG/ML IJ SOLN
100.0000 mg | Freq: Every day | INTRAMUSCULAR | Status: DC
  Administered 2019-08-08: 100 mg via INTRAVENOUS
  Filled 2019-08-08: qty 2

## 2019-08-08 MED ORDER — ZINC CHLORIDE 1 MG/ML IV SOLN
INTRAVENOUS | Status: DC
  Administered 2019-08-08: 18:00:00 via INTRAVENOUS
  Filled 2019-08-08: qty 312

## 2019-08-08 MED ORDER — SODIUM BICARBONATE-DEXTROSE 150-5 MEQ/L-% IV SOLN
150.0000 meq | INTRAVENOUS | Status: DC
  Administered 2019-08-08: 150 meq via INTRAVENOUS
  Filled 2019-08-08: qty 1000

## 2019-08-08 MED ORDER — MAGNESIUM SULFATE 4 GM/100ML IV SOLN
4.0000 g | Freq: Once | INTRAVENOUS | Status: AC
  Administered 2019-08-08: 4 g via INTRAVENOUS
  Filled 2019-08-08: qty 100

## 2019-08-08 MED ORDER — ACETAMINOPHEN 650 MG RE SUPP: 650.0000 mg | RECTAL | Status: DC | PRN

## 2019-08-08 MED ORDER — POLYETHYLENE GLYCOL 3350 17 G PO PACK: 17.0000 g | PACK | Freq: Every day | ORAL | Status: DC

## 2019-08-08 NOTE — Progress Notes (Signed)
CRITICAL VALUE ALERT  Critical Value:  116 trop  Date & Time Notied:  08/08/19 0015  Provider Notified: elink  Orders Received/Actions taken: no new orders

## 2019-08-08 NOTE — Progress Notes (Signed)
Received call back from Dr. Bobbye Morton.  Return call delayed d/t MD being in OR.  Dr. Bobbye Morton stated if we can get wound vac supplies she will place.  Will attempt to get wound vac supplies and continue to monitor closely.

## 2019-08-08 NOTE — Progress Notes (Deleted)
[facility] Progress Note Patient Name: Alison Ruiz DOB: June 17, 1931 MRN: [id number]   Date of Service  08/08/2019  HPI/Events of Note  Pt needs stool softener/ Laxative  eICU Interventions  Miralax and Colace orders entered.     Intervention Category Minor Interventions: Routine modifications to care plan (e.g. PRN medications for pain, fever)  [place] U Ogan 08/08/2019, 10:13 PM

## 2019-08-08 NOTE — Progress Notes (Signed)
Had on call physician paged again as have not heard back from first page.

## 2019-08-08 NOTE — Progress Notes (Signed)
[facility] Progress Note  1 Day Post-Op  Subjective: CC: large volume stool and NGT output Patient with large amounts stool output and NGT output overnight. VAC placed overnight for stool leaking into wound. Intubated but responsive. Requiring pressors. Lactic elevated.   Objective: Vital signs in last 24 hours: Temp:  [91.7 F (33.2 C)-99.6 F (37.6 C)] 98.6 F (37 C) (11/17 0235) Pulse Rate:  [102-440] 123 (11/17 0515) Resp:  [13-40] 19 (11/17 0645) BP: (88-175)/(20-84) 117/20 (11/17 0630) SpO2:  [85 %-100 %] 95 % (11/17 0515) Arterial Line BP: (63-117)/(36-75) 83/63 (11/17 0645) FiO2 (%):  [60 %-80 %] 80 % (11/17 0451) Weight:  [39.2 kg] 39.2 kg (11/17 0451) Last BM Date: 08/08/19  Intake/Output from previous day: 11/16 0701 - 11/17 0700 In: 6054.3 [P.O.:60; I.V.:3249.2; Blood:50; IV [id number] Out: [id number] [Urine:290; Emesis/NG output:400; Stool:850; Blood:50] Intake/Output this shift: No intake/output data recorded.  PE: Gen:  Alert, NAD, pleasant Cards: tachycardia Resp:  Intubated, soft rales RLL Abd: Soft, appropriately ttp, VAC to midline, stoma in LLQ with necrotic top layer, watery brown stool in ostomy bag, feculent material in NGT Skin: warm and dry, no rashes   Lab Results:  Recent Labs    08/03/2019 0537 07/26/2019 1604 08/20/2019 2132  WBC 6.3 5.4  --   HGB 13.4 10.6* 7.5*  HCT 38.5 30.7* 22.0*  PLT 246 225  --    BMET Recent Labs    08/08/2019 2132 08/08/19 0417  NA 145  146* 147*  K 5.1  5.0 5.3*  CL 108 111  CO2 19* 22  GLUCOSE 118* 69*  BUN 76* 80*  CREATININE 2.67* 2.50*  CALCIUM 7.2* 7.6*   PT/INR No results for input(s): LABPROT, INR in the last 72 hours. CMP     Component Value Date/Time   NA 147 (H) 08/08/2019 0417   K 5.3 (H) 08/08/2019 0417   CL 111 08/08/2019 0417   CO2 22 08/08/2019 0417   GLUCOSE 69 (L) 08/08/2019 0417   BUN 80 (H) 08/08/2019 0417   CREATININE 2.50 (H) 08/08/2019 0417   CREATININE 0.95  (H) 07/05/2019 0915   CALCIUM 7.6 (L) 08/08/2019 0417   PROT 6.8 07/24/2019 0537   ALBUMIN 2.7 (L) 08/08/2019 0417   AST 28 07/26/2019 0537   ALT 15 07/26/2019 0537   ALKPHOS 63 08/16/2019 0537   BILITOT 2.1 (H) 07/25/2019 0537   GFRNONAA 17 (L) 08/08/2019 0417   GFRNONAA 55 (L) 04/13/2011 1648   GFRAA 20 (L) 08/08/2019 0417   GFRAA >60 04/13/2011 1648   Lipase     Component Value Date/Time   LIPASE 13 08/21/2019 1719       Studies/Results: Ct Abdomen Pelvis Wo Contrast  Result Date: 08/07/2019 CLINICAL DATA:  85 year old female with generalized abdominal pain and nausea since yesterday. EXAM: CT ABDOMEN AND PELVIS WITHOUT CONTRAST TECHNIQUE: Multidetector CT imaging of the abdomen and pelvis was performed following the standard protocol without IV contrast. COMPARISON:  Report of CT Abdomen and Pelvis 05/24/2001 (no images available). FINDINGS: Lower chest: Negative; minimal right lung base atelectasis or scarring. No cardiomegaly, pericardial or pleural effusion. Hepatobiliary: Possible small round nonspecific 25 millimeter hypodense lesion of the lateral left hepatic lobe on coronal image 41. Otherwise negative noncontrast liver and gallbladder. Pancreas: Difficult to delineate, grossly negative. Spleen: Diminutive, negative. Adrenals/Urinary Tract: Adrenal glands are difficult to identified. Bilateral nephrolithiasis including a bulky 8-9 millimeter left upper pole calculus. But no hydronephrosis or perinephric stranding. The ureters are difficult to delineate.  The urinary bladder seems to be completely decompressed. Stomach/Bowel: Diffusely fluid-filled dilated bowel loops throughout the abdomen and pelvis. Evidence of a decompressed rectum and a decompressed sigmoid colon along the left pelvic sidewall. In the left lower quadrant there is a small volume of what appears to be large bowel gas at the junction of the descending and sigmoid colon (coronal image 59). However, upstream of  this the descending colon is indistinct and then the splenic flexure is dilated and fluid-filled (series 3, image 18). Dilated mostly fluid containing bowel loops upstream from that point all the way towards the proximal jejunum. Both the stomach and duodenum seem to be decompressed. A small to moderate gastric hiatal hernia is suspected. No definite pneumoperitoneum.  No definite free fluid. Vascular/Lymphatic: Vascular patency is not evaluated in the absence of IV contrast. Extensive Aortoiliac calcified atherosclerosis. Reproductive: Not identified, probably surgically absent. Other: No definite pelvic free fluid. Musculoskeletal: Osteopenia. No acute osseous abnormality identified. IMPRESSION: 1. High-grade bowel obstruction which appears to be at the level of the descending colon. The bowel in the region of the transition is poorly delineated in the absence of contrast, but in this age group consider an obstructing tumor. The rectum and sigmoid seem to be completely decompressed arguing against a distal large bowel volvulus. A repeat study with contrast would be valuable when feasible. 2. No definite free air or free fluid. 3. Suspected nonspecific 2.5 cm hypodense lesion in the left hepatic lobe. 4. Small hiatal hernia. Nephrolithiasis without obstructive uropathy. 5.  Aortic Atherosclerosis (ICD10-I70.0). Electronically Signed   By: Genevie Ann M.D.   On: 08/04/2019 19:57   Dg Abd 1 View  Result Date: 08/11/2019 CLINICAL DATA:  Bowel obstruction EXAM: ABDOMEN - 1 VIEW COMPARISON:  07/27/2019, CT 08/12/2019 FINDINGS: Esophageal tube tip is in the left upper quadrant of the abdomen. Persistent gaseous dilatation of the bowel. New left lower quadrant postsurgical changes with small amount of left lower quadrant gas. IMPRESSION: 1. New postsurgical changes in the left lower quadrant with sutures visible. There appears to be extraluminal gas in the left lower quadrant presumably due to recent surgery. 2.  Continued small and large bowel dilatation. Electronically Signed   By: Donavan Foil M.D.   On: 08/08/2019 19:32   Dg Chest Port 1 View  Result Date: 08/08/2019 CLINICAL DATA:  Hypoxia EXAM: PORTABLE CHEST 1 VIEW COMPARISON:  August 07, 2019 FINDINGS: Endotracheal tube tip is 1.9 cm above the carina. Central catheter tip is at the junction the left innominate vein and superior vena cava. Nasogastric tube tip and side port in stomach. No pneumothorax. There is mild atelectatic change in the right base. There is no edema or consolidation. Heart is mildly enlarged with pulmonary vascularity within normal limits. No adenopathy appreciable. There is aortic atherosclerosis. IMPRESSION: Tube and catheter positions as described without pneumothorax. Mild right base atelectasis. No edema or consolidation. Stable cardiac prominence. Aortic Atherosclerosis (ICD10-I70.0). Electronically Signed   By: Lowella Grip III M.D.   On: 08/08/2019 06:57   Dg Chest Port 1 View  Result Date: 08/17/2019 CLINICAL DATA:  85 year old female with acute respiratory failure. Acute kidney injury. HIV. EXAM: PORTABLE CHEST 1 VIEW COMPARISON:  1517 hours today and earlier. FINDINGS: Portable AP semi upright view at 2211 hours. Endotracheal tube tip in good position between the level the clavicles and carina. Stable left IJ approach central line. Stable enteric tube, side hole at the level of the gastric cardia. Mildly lower lung volumes with patchy  right lung base and perihilar opacity most resembling atelectasis. No pneumothorax or pulmonary edema. No definite pleural effusion. Stable cardiac size and mediastinal contours. Negative visible bowel gas pattern. No acute osseous abnormality identified. IMPRESSION: 1. Stable lines and tubes. 2. Mildly lower lung volumes with right lung base and perihilar opacity most resembling atelectasis. Electronically Signed   By: Genevie Ann M.D.   On: 07/24/2019 22:23   Portable Chest  X-ray  Result Date: 08/01/2019 CLINICAL DATA:  Postop partial colectomy and colostomy. Intubated patient. EXAM: PORTABLE CHEST 1 VIEW COMPARISON:  Chest radiographs 01/21/2019 and 01/19/2019. Chest CT 01/21/2019 and abdominal CT 08/13/2019. FINDINGS: 1517 hours. Tip of the endotracheal tube is approximately 3.6 cm above the carina. Left IJ central venous catheter projects to the level of the mid SVC. A nasogastric tube projects below the diaphragm with tip in the left upper quadrant of the abdomen. Patient is mildly rotated to the right. There is mild atelectasis at the right lung base. The left lung is clear. There is no pleural effusion or pneumothorax. The heart size is stable. There is diffuse aortic atherosclerosis. IMPRESSION: 1. Support system positioning as described. Mild right basilar atelectasis. 2. No evidence of pneumothorax or significant pleural effusion. Electronically Signed   By: Richardean Sale M.D.   On: 07/31/2019 15:38   Dg Abd Portable 1 View  Result Date: 07/29/2019 CLINICAL DATA:  85 year old female NG tube placement. EXAM: PORTABLE ABDOMEN - 1 VIEW COMPARISON:  Portable chest 0126 hours. FINDINGS: AP view at 0246 hours. Similar to the prior portable chest, and enteric tube courses to the proximal stomach but then is looped back upon itself continuing retrograde back up the esophagus. The tip is not included on this image. Stable visible lung bases and upper abdomen otherwise. Stable bowel gas pattern. IMPRESSION: Enteric tube again looped upon itself at the level of the proximal stomach, and extending retrograde back in the esophagus. Technologist reports the RN removed the tube after this image was obtained. Electronically Signed   By: Genevie Ann M.D.   On: 07/31/2019 03:22   Dg Abd Portable 1 View  Result Date: 07/26/2019 CLINICAL DATA:  NG tube EXAM: PORTABLE ABDOMEN - 1 VIEW COMPARISON:  CT earlier today FINDINGS: The NG tube coils in the fundus of the stomach, then reentered  the esophagus. The tip of the NG tube is in the upper thoracic esophagus. Dilated bowel loops noted in the upper abdomen. Visualized lungs clear. IMPRESSION: NG tube coils in the fundus of the stomach, and re-entered the esophagus with the tip in the upper thoracic esophagus. Electronically Signed   By: Rolm Baptise M.D.   On: 07/23/2019 01:42    Anti-infectives: Anti-infectives (From admission, onward)   Start     Dose/Rate Route Frequency Ordered Stop   07/29/2019 1800  Ampicillin-Sulbactam (UNASYN) 3 g in sodium chloride 0.9 % 100 mL IVPB     3 g 200 mL/hr over 30 Minutes Intravenous Every 24 hours 08/15/2019 1642 08/12/19 1759   08/16/2019 0830  cefoTEtan (CEFOTAN) 2 g in sodium chloride 0.9 % 100 mL IVPB    Note to Pharmacy: preop area [place] to adjust dose   2 g 200 mL/hr over 30 Minutes Intravenous  Once 08/19/2019 0808 08/02/2019 1223       Assessment/Plan HIV AKI - Cr 2.5, UOP low, continue to monitor VDRF/aspiration - vent per CCM, appreciate assistance  Descending colon mass S/p exploratory laparotomy, partial colectomy and colostomy - POD#1 - path pending  -  intestines very dilated in OR yesterday - hopefully stool output slows down as she decompresses - if she continues to have high volume output and WBC going up - consider C.Diff and GI panel given immunosuppression  - stoma appears necrotic - expect some sloughing, continue to monitor - continue supportive measures - VAC T/TR/Sat  FEN: NPO, IVF; NGT on LIWS VTE: SQ heparin  ID: [email or web address] pre-op; unasyn 11/16>>  LOS: 2 days    Brigid Re , [facility] 08/08/2019, 7:23 AM Please see Amion for pager number during day hours 7:00am-4:30pm

## 2019-08-08 NOTE — Progress Notes (Signed)
Nutrition Follow-up  DOCUMENTATION CODES:   Severe malnutrition in context of chronic illness, Underweight  INTERVENTION:   Recommend initiation of TPN given severe malnutrition and inability to take po/receive EN; TPN per Pharmacy  Monitor magnesium, potassium, and phosphorus for at least 5 occurrences, MD to replete as needed, as pt is at risk for refeeding syndrome given severe malnutrition, underweight.   NUTRITION DIAGNOSIS:   Severe Malnutrition related to chronic illness(HIV) as evidenced by severe fat depletion, severe muscle depletion.  Being addressed via TPN  GOAL:   Patient will meet greater than or equal to 90% of their needs  Progressing  MONITOR:   PO intake, Supplement acceptance, Diet advancement, Weight trends, Labs, I & O's  REASON FOR ASSESSMENT:   Malnutrition Screening Tool    ASSESSMENT:   Patient with PMH significant for HIV. Presents this admission with bowl obstruction.  11/15 Admit 11/16 OR: Ex lap with large bowel obstruction s/p partial colectomy and colostomy, Intubated post-up with resp acidosis  NG tube with feculent output, coming from mouth as well per RN. +stoll via colostomy. Stool leaking from ostomy pouch and leaking under wound vac. Difficulty maintaining seal around Wound Vac Stoma necrotic, 100% black per WOC RN  Patient is currently intubated on ventilator support, currently sedated with precedex and fentanyl drips; neosynephrine for pressor support MV: 8.5 L/min Temp (24hrs), Avg:97.1 F (36.2 C), Min:91.7 F (33.2 C), Max:99.6 F (37.6 C)  Propofol: NONE  Colostomy with 750 mL out today, 850 mL charted yesterday. Noted some stool not accounted for. Minimal output via wound vac  Current wt 39.2 kg; admit weight 39.3 kg; net+ since admission but no edema present on exam  Given severe malnutrition with poor intake PTA and now inability to take po or receive EN, early initiation of TPN is appropriate. Discussed TPN  initiation with Surgery; plan to initiate TPN today  Labs: sodium 146 (H), Creatinine 2.48, BUN 80 Meds: ss novolog, sodium bicarb in D5 at 100 ml/hr   Diet Order:   Diet Order            Diet NPO time specified  Diet effective now              EDUCATION NEEDS:   Not appropriate for education at this time  Skin:  Skin Assessment: Skin Integrity Issues: Skin Integrity Issues:: Wound VAC Wound Vac: abdomen Incisions: closed abdomen  Last BM:  11/17  Height:   Ht Readings from Last 1 Encounters:  08/06/2019 5\' 2"  (1.575 m)    Weight:   Wt Readings from Last 1 Encounters:  08/08/19 39.2 kg    Ideal Body Weight:  50 kg  BMI:  Body mass index is 15.81 kg/m.  Estimated Nutritional Needs:   Kcal:  1350-1550 kcals  Protein:  78-98 g  Fluid:  >/= 1.3 L/day   Alison Passey MS, RDN, LDN, CNSC [phone] Pager  [phone] Weekend/On-Call Pager

## 2019-08-08 NOTE — Progress Notes (Signed)
Per Dr. Lucile Shutters, titrate pressors for MAP >65

## 2019-08-08 NOTE — Anesthesia Postprocedure Evaluation (Signed)
Anesthesia Post Note  Patient: Alison Ruiz  Procedure(s) Performed: PARTIAL COLECTOMY AND COLOSTOMY (N/A Abdomen) Exploratory Laparotomy (Abdomen)     Patient location during evaluation: SICU Anesthesia Type: General Level of consciousness: sedated Pain management: pain level controlled Vital Signs Assessment: post-procedure vital signs reviewed and stable Respiratory status: patient remains intubated per anesthesia plan Cardiovascular status: stable Postop Assessment: no apparent nausea or vomiting Anesthetic complications: no    Last Vitals:  Vitals:   08/08/19 0645 08/08/19 0810  BP:  91/66  Pulse:  (!) 107  Resp: 19   Temp:  36.4 C  SpO2:      Last Pain:  Vitals:   08/08/19 0810  TempSrc: Axillary  PainSc:                  [name]

## 2019-08-08 NOTE — Progress Notes (Signed)
Patient return to ICU postoperatively and was intubated.  PCCM was consulted yesterday.  Patient not seen today.  Chart reviewed.  Case discussed with Dr. Carlis Abbott from PCCM who was gracious enough to assume care as primary.  Hospital service will sign off.  Please let us know when patient is ready to be taken over by [facility].

## 2019-08-08 NOTE — Plan of Care (Signed)
Worsening shock this afternoon but responding to IVF.   ABG    Component Value Date/Time   PHART 7.539 (H) 08/08/2019 1806   PCO2ART 38.1 08/08/2019 1806   PO2ART 106.0 08/08/2019 1806   HCO3 32.6 (H) 08/08/2019 1806   TCO2 34 (H) 08/08/2019 1806   ACIDBASEDEF 9.0 (H) 08/08/2019 2132   O2SAT 99.0 08/08/2019 1806   BMP Latest Ref Rng & Units 08/08/2019 08/08/2019 08/08/2019  Glucose 70 - 99 mg/dL - 101(H) -  BUN 8 - 23 mg/dL - 72(H) -  Creatinine 0.44 - 1.00 mg/dL - 2.27(H) -  BUN/Creat Ratio 6 - 22 (calc) - - -  Sodium 135 - 145 mmol/L 148(H) 149(H) 146(H)  Potassium 3.5 - 5.1 mmol/L 5.1 4.9 4.7  Chloride 98 - 111 mmol/L - 110 -  CO2 22 - 32 mmol/L - 27 -  Calcium 8.9 - 10.3 mg/dL - 7.1(L) -   Lactate 4.8  Trop 4121 <-- 195  CBC    Component Value Date/Time   WBC 9.2 08/08/2019 1458   RBC 3.30 (L) 08/08/2019 1458   HGB 7.1 (L) 08/08/2019 1806   HCT 21.0 (L) 08/08/2019 1806   PLT 71 (L) 08/08/2019 1458   MCV 86.4 08/08/2019 1458   MCH 31.2 08/08/2019 1458   MCHC 36.1 (H) 08/08/2019 1458   RDW 13.3 08/08/2019 1458   LYMPHSABS 0.4 (L) 01/23/2019 0549   MONOABS 0.3 01/23/2019 0549   EOSABS 0.0 01/23/2019 0549   BASOSABS 0.0 01/23/2019 0549     Unfortunately with evidence of ongoing bleeding vs hemodilution, we cannot start AC at this time. Will defer cardiology consultation at this time as she is not a candidate for intervention if she cannot be anticoagulated. Will resusctiate with pRBCs, IVF, electrolytes. Bicarb infusion has been stopped given alkalosis.    I spoke to Nina, Ms. Allmon's sister to update her. At this stage as she has worsened throughout the day, I am concerned that this illness may not be reversible and she may not make it through the night. [place] and her daughter are coming to visit tonight. We discussed code status- that it would likely not be beneficial to perform CPR if she continues to deteriorate to the point of cardiac arrest despite  maximal medical intervention.  Darthula agrees that aggressive resuscitation in the event of a cardiac arrest would be potentially hurtful and unlikely to help.  CODE STATUS changed and bedside RN updated.  Darthula wanted an additional sisters contact information added to the chart - Doris M. Tamala Julian  (lives in [place]).  She will update Doris on her sister's condition.  CC time: 25 minutes   Julian Hy, DO 08/08/19 7:31 PM [facility]

## 2019-08-08 NOTE — Progress Notes (Signed)
Hypoglycemic Event  CBG: 62  Treatment: D50 50 mL (25 gm)  Symptoms: None  Follow-up CBG: [id number] CBG Result:76  Possible Reasons for Event: Unknown  Comments/MD notified:protocol    [name]

## 2019-08-08 NOTE — Progress Notes (Signed)
Wound vac supplies received and are at bedside.  MD paged and made aware.

## 2019-08-08 NOTE — Progress Notes (Signed)
CCS physician paged for 3rd time.

## 2019-08-08 NOTE — Progress Notes (Signed)
CRITICAL VALUE ALERT  Critical Value:  195  Date & Time Notied:  08/08/19 0143  Provider Notified: elink  Orders Received/Actions taken: no new orders will continue to monitor

## 2019-08-08 NOTE — Progress Notes (Addendum)
RT obtained ABG on pt with the following results. No changes at this time. RT will continue to monitor.   Results for Alison Ruiz, Alison Ruiz (MRN [id number]) as of 08/08/2019 00:58  Ref. Range 08/21/2019 21:32  Sample type Unknown ARTERIAL  pH, Arterial Latest Ref Range: 7.350 - 7.450  7.202 (L)  pCO2 arterial Latest Ref Range: 32.0 - 48.0 mmHg 47.5  pO2, Arterial Latest Ref Range: 83.0 - 108.0 mmHg 88.0  TCO2 Latest Ref Range: 22 - 32 mmol/L 20 (L)  Acid-base deficit Latest Ref Range: 0.0 - 2.0 mmol/L 9.0 (H)  Bicarbonate Latest Ref Range: 20.0 - 28.0 mmol/L 18.8 (L)  O2 Saturation Latest Units: % 95.0  Patient temperature Unknown 97.7 F  Collection site Unknown ARTERIAL LINE

## 2019-08-08 NOTE — Progress Notes (Signed)
[facility] Progress Note Patient Name: Alison Ruiz DOB: 10-20-1930 MRN: [id number]   Date of Service  08/08/2019  HPI/Events of Note  Fever. NG Tube is hooked up to LIS.  eICU Interventions  Tylenol suppositories ordered.        Kerry Kass Ogan 08/08/2019, 10:09 PM

## 2019-08-08 NOTE — Progress Notes (Signed)
IV team consult for new PIV placement and to access TPN line as it is not running well.  Will continue to monitor.

## 2019-08-08 NOTE — Progress Notes (Signed)
Dr. Bobbye Morton to bedside and placed wound vac.  Wound vac at 14mmHg.  Will continue to monitor.

## 2019-08-08 NOTE — Consult Note (Addendum)
[place] Nurse Consult Note: Reason for Consult: Consult requested for leaking Vac.  This was applied last night by the surgical team and the current ostomy pouch has leaked behind the seal and is soiling the wound.  Wound type: Full thickness post-op midline wound; beefy red Measurement:9X3X.3cm Drainage (amount, consistency, odor) small amt pink drainage, no odor Periwound: intact skin surrounding.  Ostomy stoma is located almost directly next to the wound and it is difficult to maintain a seal.  Applied barrier ring to attempt to form a seal at 3:00 o'clock, and one piece black foam to 125 mm cont suction.  [place] team will plan to change again on Friday.  Pt is sedated and tolerated without apparent discomfort.   [place] Nurse ostomy consult note Pt had colostomy surgery performed yesterday. Stoma type/location: Stoma is 100% black, flush with skin level Stomal assessment/size:  1 3/8 inches Peristomal assessment: located directly next to the full thickness abd wound Treatment options for stomal/peristomal skin:  Applied another barrier ring around the stoma site to attempt to maintain a seal, then a one piece flexible convex pouch  Output: large amt liquid brown stool  Ostomy pouching: 1pc.  Education provided:  Pt is critically ill and on the vent.  [place] team will begin teaching sessions when stable and out of ICU. Extra ostomy supplies ordered for the bedside nurse to use PRN. Enrolled patient in [employer] program: No Julien Girt MSN, [place], [place], [place], [place]

## 2019-08-08 NOTE — Progress Notes (Addendum)
NAME:  Alison Ruiz, MRN:  [id number], DOB:  1931-08-24, LOS: 2 ADMISSION DATE:  08/05/2019, CONSULTATION DATE:  08/06/2019 REFERRING MD:  Dr. Ninfa Linden, CHIEF COMPLAINT:  abd pain/ N/V   Brief History   85 year old female with significant hx of HIV presenting with abdominal pain, nausea, and vomiting found to have large bowel obstruction in the descending colon.  Failed medical management, ongoing nausea/ vomiting and noted to having pooling of gastric secretions during RSI prior to going to OR on 11/16 for partial colectomy and colostomy.  Returns to ICU sedated and intubated.   History of present illness   HPI obtained from medical chart review as patient is sedated/ intubated on mechanical ventilator.    85 year old female with prior history as below presenting from home with five day history of nausea, vomiting, and abdominal pain.  Patient apparently lives alone.  Found to have AKI and CT scan to have findings consistent with large bowel obstruction in the descending colon.  She was admitted to [facility] however failed medical management.  NGT unable to be placement with ongoing emesis.  At time of RSI in OR, noted to having pooling of gastric secretions in posterior pharynx.  Taken to OR on 11/16 for exploratory laparotomy with partial colectomy and colostomy; fasica was closed, skin remains open.  Post-operatively returns to ICU sedated on mechanical ventilation.   Past Medical History  Never smoker HIV/ hep B, anxiety, normocytic anemia, protein calorie malnutrition, LBBB/ syncope/ bradycardia, HLD,   Significant Hospital Events   11/15 Admitted [facility] 11/16 OR  Consults:  CC[facility]  Procedures:  11/16 ETT >> 11/16 R CVL IJ >> 11/16 OR ->ex lap, partial colectomy and colostomy   Significant Diagnostic Tests:  11/15 CT abd/ pelvis >> 1. High-grade bowel obstruction which appears to be at the level of the descending colon. The bowel in the region of the transition is poorly delineated in the  absence of contrast, but in this age group consider an obstructing tumor.  The rectum and sigmoid seem to be completely decompressed arguing against a distal large bowel volvulus.  A repeat study with contrast would be valuable when feasible. 2. No definite free air or free fluid. 3. Suspected nonspecific 2.5 cm hypodense lesion in the left hepatic lobe. 4. Small hiatal hernia. Nephrolithiasis without obstructive uropathy. 5.  Aortic Atherosclerosis   Micro Data:  11/15 SARS CoV2 >> neg 11/16 MRSA PCR >> neg  Antimicrobials:  11/16 cefotetan preop 11/16 unasyn >>  Interim history/subjective:  Continues on Neo-Synephrine Noted to have stool from NG tube endotracheal tube smells like stool  Objective   Blood pressure 91/66, pulse (!) 107, temperature 97.6 F (36.4 C), temperature source Axillary, resp. rate 19, height 5\' 2"  (1.575 m), weight 39.2 kg, SpO2 95 %. CVP:  [4 mmHg-14 mmHg] 14 mmHg  Vent Mode: PRVC FiO2 (%):  [60 %-80 %] 60 % Set Rate:  [16 bmp-22 bmp] 22 bmp Vt Set:  [400 mL] 400 mL PEEP:  [5 cmH20] 5 cmH20 Plateau Pressure:  [18 cmH20-20 cmH20] 20 cmH20   Intake/Output Summary (Last 24 hours) at 08/08/2019 0925 Last data filed at 08/08/2019 0900 Gross per 24 hour  Intake 5994.27 ml  Output 2400 ml  Net 3594.27 ml   Filed Weights   08/04/2019 0336 08/08/19 0451  Weight: 39.3 kg 39.2 kg    Examination: General: Cachectic frail female who is awake alert HEENT: Endotracheal tube is in place, NG tube is in place with  stool drainage Neuro: Intact follows commands CV: Heart sounds are regular PULM: Coarse rhonchi bilaterally GI: VAC in place, colostomy with stool drainage Extremities: warm/dry, negative edema  Skin: no rashes or lesions  Resolved Hospital Problem list    Assessment & Plan:    Acute respiratory insufficiency Suspected aspiration  P:  Continue full mechanical ventilatory support Wean as tolerated Serial chest x-ray Empirical Unasyn  Pulmonary toilet  Hypotension - prior TTE 01/20/2019 EF 45-50, mildly reduced RV systolic function, mild/mod TR, myocardium of LV suspicious for noncompaction P:  Continue to monitor Wean vasopressors as tolerated    Large bowel obstruction in descending colon s/p partial colectomy and colostomy 11/16 Noted to have stool coming from NG tube P:  Per [facility] [place] [facility] Follow pathology Continue suction NG tube     AKI related to dehydration/ N/V AGMA/ lactic acidosis lactic acid 4.0 08/08/2019 which is now Mild hyperkalemia  Lab Results  Component Value Date   CREATININE 2.48 (H) 08/08/2019   CREATININE 2.50 (H) 08/08/2019   CREATININE 2.67 (H) 07/29/2019   CREATININE 0.95 (H) 07/05/2019   CREATININE 1.12 (H) 03/20/2019   CREATININE 1.11 (H) 02/20/2019   Recent Labs  Lab 08/08/19 0417 08/08/19 0744 08/08/19 0807  K 5.3* 4.8 4.7    P:   Monitor replete electrolytes as needed   Hyperglycemia CBG (last 3)  Recent Labs    08/08/19 0415 08/08/19 0621 08/08/19 0759  GLUCAP 60* 76 70    P: Sliding-scale insulin   HIV  - followed by Dr. Megan Salon - 07/05/2019 CD4 55, RNA 103  P:  Continue to hold age medications for now   Best practice:  Diet: NPO Pain/Anxiety/Delirium protocol (if indicated): RASS goal 0-/1 VAP protocol (if indicated): yes DVT prophylaxis: SCDs, heparin when CCS ok  GI prophylaxis: PPI Glucose control: SSI Mobility: BR Code Status: Full  Family Communication: Patient updated at bedside 08/08/2019 Disposition: ICU  Labs   CBC: Recent Labs  Lab 08/03/2019 1719 07/31/2019 0537 08/08/2019 1604 07/25/2019 2132 08/08/19 0600 08/08/19 0807  WBC 4.1 6.3 5.4  --  7.3  --   HGB 13.4 13.4 10.6* 7.5* 10.4* 8.2*  HCT 39.7 38.5 30.7* 22.0* 28.7* 24.0*  MCV 92.1 90.4 90.8  --  85.9  --   PLT 256 246 225  --  95*  --     Basic Metabolic Panel: Recent Labs  Lab 07/27/2019 0537 08/02/2019 1604 08/14/2019 2132 08/08/19 0417 08/08/19 0744  08/08/19 0807  NA 141 140 145  146* 147* 147* 146*  K 5.5* 6.0* 5.1  5.0 5.3* 4.8 4.7  CL 104 110 108 111 108  --   CO2 17* 14* 19* 22 22  --   GLUCOSE 191* 176* 118* 69* 77  --   BUN 74* 77* 76* 80* 80*  --   CREATININE 2.45* 2.77* 2.67* 2.50* 2.48*  --   CALCIUM 8.4* 7.5* 7.2* 7.6* 7.5*  --   MG  --  2.3  --   --   --   --   PHOS  --  7.5*  --  3.3  --   --    GFR: Estimated Creatinine Clearance: 10.3 mL/min (A) (by C-G formula based on SCr of 2.48 mg/dL (H)). Recent Labs  Lab 08/05/2019 1719  08/02/2019 0537 08/17/2019 1604 08/19/2019 1900 08/08/19 0008 08/08/19 0600 08/08/19 0744  WBC 4.1  --  6.3 5.4  --   --  7.3  --   LATICACIDVEN  --    < >  --  6.8* 8.3* 5.5*  --  4.0*   < > = values in this interval not displayed.    Liver Function Tests: Recent Labs  Lab 08/11/2019 1719 08/17/2019 0537 07/30/2019 1604 08/08/19 0417 08/08/19 0744  AST 19 28  --   --  114*  ALT 14 15  --   --  31  ALKPHOS 67 63  --   --  94  BILITOT 1.6* 2.1*  --   --  5.2*  PROT 7.7 6.8  --   --  4.5*  ALBUMIN 3.7 3.4* 3.4* 2.7* 2.7*   Recent Labs  Lab 07/27/2019 1719  LIPASE 13   No results for input(s): AMMONIA in the last 168 hours.  ABG    Component Value Date/Time   PHART 7.422 08/08/2019 0807   PCO2ART 38.2 08/08/2019 0807   PO2ART 79.0 (L) 08/08/2019 0807   HCO3 24.9 08/08/2019 0807   TCO2 26 08/08/2019 0807   ACIDBASEDEF 9.0 (H) 07/31/2019 2132   O2SAT 96.0 08/08/2019 0807     Coagulation Profile: No results for input(s): INR, PROTIME in the last 168 hours.  Cardiac Enzymes: No results for input(s): CKTOTAL, CKMB, CKMBINDEX, TROPONINI in the last 168 hours.  HbA1C: Hgb A1c MFr Bld  Date/Time Value Ref Range Status  08/03/2019 04:04 PM 5.2 4.8 - 5.6 % Final    Comment:    (NOTE) Pre diabetes:          5.7%-6.4% Diabetes:              >6.4% Glycemic control for   <7.0% adults with diabetes   01/17/2019 07:13 PM 6.5 (H) 4.8 - 5.6 % Final    Comment:    (NOTE) Pre  diabetes:          5.7%-6.4% Diabetes:              >6.4% Glycemic control for   <7.0% adults with diabetes     CBG: Recent Labs  Lab 08/19/2019 2328 08/08/19 0007 08/08/19 0415 08/08/19 0621 08/08/19 0759  GLUCAP 52* 162* 60* 76 70     Critical care time: 40 mins     Richardson Landry Minor ACNP [facility]   85 year old woman with a history of HIV who presented with an acute bowel obstruction.  Partial colectomy with diverting ostomy on 11/16, but had aspiration at the time of intubation.  Returned to the ICU intubated, sedated, requiring vasopressors.  Overnight received PRBCs and fluid boluses-crystalloid and albumin.  BP 91/66   Pulse (!) 107   Temp 97.6 F (36.4 C) (Axillary)   Resp 19   Ht 5\' 2"  (1.575 m)   Wt 39.2 kg   SpO2 95%   BMI 15.81 kg/m  Awake, answering y/n questions abd soft, ND, mildy TTP L>R No edema LIJ CVC  Assessment & Plan: Acute hypoxic respiratory failure requiring mechanical ventilation, aspiration pneumonitis versus pneumonia P:  -Continue LTV V, 8 cc/kg ideal body weight.  Goal plateau less than 30 and driving pressure less than 15. -Daily SAT and SBT.  Will defer today due to ongoing very high NG output and oxygen requirements -Titrate down FiO2 as able to maintain saturations greater than 90% -VAP prevention protocol -Continue Unasyn to complete 5-day course -PAD protocol with fentanyl prn/ precedex   Shock- septic and likely hypovolemic - prior TTE 01/20/2019 EF 45-50, mildly reduced RV systolic function, mild/mod TR, myocardium of LV suspicious for noncompaction P:  -Additional LR for volume resuscitation -Continue to monitor I/O -  Levophed and Neo-Synephrine as needed to maintain MAP greater than 65 -Observe for signs of bleeding, none of which are present currently  Large bowel obstruction in descending colon s/p partial colectomy and colostomy on 11/16 -concern for colon cancer.  No perforation prior to resection P:  -Appreciate  surgery's input -Continue NG tube to suction and monitoring ostomy output   AKI related to dehydration/ N/V AGMA 2/2 lactic acidosis Mild hyperkalemia  P:  -Serial lactate levels; giving additional crystalloid now -Continue bicarbonate infusion -Checking ABG, BMP this morning-can recheck in the afternoon to determine timing of discontinuing bicarbonate -Monitor I/O -Avoid nephrotoxic meds -Renally dose meds -continue Foley to monitor urine output  Hyperglycemia-now with hypoglycemia P: -Adding D5W infusion at 50 cc/h -Continue Accu-Cheks every 4 hours  Acute anemia- likely due to dilution with volume resuscitation and surgical blood loss, s/p pRBCs on 11/17 -serial CBCs  -transfuse for H&H <7 or signs of ongoing bleeding  HIV  - followed by Dr. Megan Salon - 07/05/2019 CD4 55, RNA 103  P:  -Continue to hold meds while unable to take PO  Niece and sister updated via phone.   This patient is critically ill with multiple organ system failure which requires frequent high complexity decision making, assessment, support, evaluation, and titration of therapies. This was completed through the application of advanced monitoring technologies and extensive interpretation of multiple databases. During this encounter critical care time was devoted to patient care services described in this note for 40 minutes.   Julian Hy, DO 08/08/19 10:09 AM  Pulmonary & Critical Care

## 2019-08-08 NOTE — Progress Notes (Signed)
PHARMACY - ADULT TOTAL PARENTERAL NUTRITION CONSULT NOTE  Indication:  Prolonged ileus  Patient Measurements: Height: 5\' 2"  (157.5 cm) Weight: 86 lb 6.7 oz (39.2 kg) IBW/kg (Calculated) : 50.1 TPN AdjBW (KG): 39.3 Body mass index is 15.81 kg/m. Usual Weight: 100 lbs  Assessment:  87 YOF presented on 08/15/2019 with abdominal pain x 5 days, nausea and vomiting.  Found to have SBO and is s/p ex-lap with partial colectomy and colostomy on 07/27/2019.  Per documentation, patient has severe malnutrition in setting of HIV and was not eating well for 5 days PTA.  She also lost 20 lbs unintentionally.  Pharmacy consulted to manage TPN for prolonged ileus.  She is at risk for refeeding syndrome.  GI: NG O/P 414mL, stool O/P 869mL, emesis on 11/16 - PPI IV Endo: hypoglycemia prior to TPN - D5W at 50/hr Insulin requirements in the past 24 hours: N/A Lytes: Na 146, iCa 0.99, Phos down to 3.3, Mag was 2.3 on 11/16 Renal: SCr 2.48, BUN 80 prior to TPN - UOP 0.3 ml/kg/hr, net +4.9L Pulm: intubated, FiO2 60% Cards: MAP 60-70s, tachy, CVP 7-14 - Levophed + phenylephrine Hepatobil: LFTs WNL except AST at 114, tbili 5.2 (yellow eyes) Neuro: Precedex, Fentanyl - GCS 10, RASS -1, ICDSC 3 ID: [email or web address] for suspected aspiration - afebrile, WBC WNL, LA 4 TPN Access: CVC double lumen placed 08/05/2019 TPN start date: 08/08/19  Nutritional Goals (per RD rec on 11/16): 1300-1500 kCal, 65-80gm, >/= 1.3L per day  Current Nutrition:  NPO  Plan:  Start lipid on day 1 of TPN in ICU patient d/t severe malnutrition Initiate TPN at 25 ml/hr (goal rate 60 ml/hr) TPN will provide 31g AA, 84g CHO and 17g ILE for a total of 584 kCal, meeting ~42% of patient needs Electrolytes in TPN: no Na, increase Ca, standard K/Phos/Mag given low TPN rate, max acetate Daily multivitamin and thiamine in TPN (d/c IV thiamine bolus) Remove trace elements and add zinc 5mg , selenium 49mcg, chromium 56mcg Start sensitive SSI Q6H D/C D5W  when TPN starts Standard TPN labs and nursing care orders  Alison Ruiz D. Mina Marble, PharmD, BCPS, [place] 08/08/2019, 11:20 AM

## 2019-08-08 NOTE — Progress Notes (Signed)
Jeannie with [employer] advised of pt temp of 103.1.  Will await further orders and continue to monitor.

## 2019-08-09 ENCOUNTER — Encounter (HOSPITAL_COMMUNITY): Payer: Self-pay | Admitting: Primary Care

## 2019-08-09 ENCOUNTER — Inpatient Hospital Stay (HOSPITAL_COMMUNITY): Payer: Medicare Other

## 2019-08-09 ENCOUNTER — Other Ambulatory Visit: Payer: Self-pay

## 2019-08-09 DIAGNOSIS — Z7189 Other specified counseling: Secondary | ICD-10-CM

## 2019-08-09 DIAGNOSIS — Z515 Encounter for palliative care: Secondary | ICD-10-CM

## 2019-08-09 DIAGNOSIS — F411 Generalized anxiety disorder: Secondary | ICD-10-CM

## 2019-08-09 DIAGNOSIS — E785 Hyperlipidemia, unspecified: Secondary | ICD-10-CM

## 2019-08-09 LAB — TYPE AND SCREEN
ABO/RH(D): O POS
Antibody Screen: NEGATIVE
Unit division: 0
Unit division: 0

## 2019-08-09 LAB — CBC
HCT: 28.7 % — ABNORMAL LOW (ref 36.0–46.0)
Hemoglobin: 10.4 g/dL — ABNORMAL LOW (ref 12.0–15.0)
MCH: 31.1 pg (ref 26.0–34.0)
MCHC: 36.2 g/dL — ABNORMAL HIGH (ref 30.0–36.0)
MCV: 85.9 fL (ref 80.0–100.0)
Platelets: 95 10*3/uL — ABNORMAL LOW (ref 150–400)
RBC: 3.34 MIL/uL — ABNORMAL LOW (ref 3.87–5.11)
RDW: 13.8 % (ref 11.5–15.5)
WBC: 7.3 10*3/uL (ref 4.0–10.5)
nRBC: 1.6 % — ABNORMAL HIGH (ref 0.0–0.2)

## 2019-08-09 LAB — PHOSPHORUS: Phosphorus: 4.2 mg/dL (ref 2.5–4.6)

## 2019-08-09 LAB — BPAM RBC
Blood Product Expiration Date: 202012222359
Blood Product Expiration Date: 202012232359
ISSUE DATE / TIME: 202011170214
ISSUE DATE / TIME: 202011172120
Unit Type and Rh: 5100
Unit Type and Rh: 5100

## 2019-08-09 LAB — COMPREHENSIVE METABOLIC PANEL
ALT: 54 U/L — ABNORMAL HIGH (ref 0–44)
AST: 305 U/L — ABNORMAL HIGH (ref 15–41)
Albumin: 2.2 g/dL — ABNORMAL LOW (ref 3.5–5.0)
Alkaline Phosphatase: 172 U/L — ABNORMAL HIGH (ref 38–126)
Anion gap: 16 — ABNORMAL HIGH (ref 5–15)
BUN: 67 mg/dL — ABNORMAL HIGH (ref 8–23)
CO2: 22 mmol/L (ref 22–32)
Calcium: 8.2 mg/dL — ABNORMAL LOW (ref 8.9–10.3)
Chloride: 113 mmol/L — ABNORMAL HIGH (ref 98–111)
Creatinine, Ser: 2.49 mg/dL — ABNORMAL HIGH (ref 0.44–1.00)
GFR calc Af Amer: 20 mL/min — ABNORMAL LOW (ref >60)
GFR calc non Af Amer: 17 mL/min — ABNORMAL LOW (ref >60)
Glucose, Bld: 96 mg/dL (ref 70–99)
Potassium: 5.3 mmol/L — ABNORMAL HIGH (ref 3.5–5.1)
Sodium: 151 mmol/L — ABNORMAL HIGH (ref 135–145)
Total Bilirubin: 5.2 mg/dL — ABNORMAL HIGH (ref 0.3–1.2)
Total Protein: 4.2 g/dL — ABNORMAL LOW (ref 6.5–8.1)

## 2019-08-09 LAB — CBC WITH DIFFERENTIAL/PLATELET
Abs Immature Granulocytes: 0.65 10*3/uL — ABNORMAL HIGH (ref 0.00–0.07)
Basophils Absolute: 0 10*3/uL (ref 0.0–0.1)
Basophils Relative: 0 %
Eosinophils Absolute: 0 10*3/uL (ref 0.0–0.5)
Eosinophils Relative: 0 %
HCT: 37 % (ref 36.0–46.0)
Hemoglobin: 13 g/dL (ref 12.0–15.0)
Immature Granulocytes: 3 %
Lymphocytes Relative: 10 %
Lymphs Abs: 1.9 10*3/uL (ref 0.7–4.0)
MCH: 32.5 pg (ref 26.0–34.0)
MCHC: 35.1 g/dL (ref 30.0–36.0)
MCV: 92.5 fL (ref 80.0–100.0)
Monocytes Absolute: 1.2 10*3/uL — ABNORMAL HIGH (ref 0.1–1.0)
Monocytes Relative: 6 %
Neutro Abs: 15.8 10*3/uL — ABNORMAL HIGH (ref 1.7–7.7)
Neutrophils Relative %: 81 %
Platelets: 31 10*3/uL — ABNORMAL LOW (ref 150–400)
RBC: 4 MIL/uL (ref 3.87–5.11)
RDW: 16.2 % — ABNORMAL HIGH (ref 11.5–15.5)
WBC: 19.7 10*3/uL — ABNORMAL HIGH (ref 4.0–10.5)
nRBC: 21.8 % — ABNORMAL HIGH (ref 0.0–0.2)

## 2019-08-09 LAB — GLUCOSE, CAPILLARY
Glucose-Capillary: 149 mg/dL — ABNORMAL HIGH (ref 70–99)
Glucose-Capillary: 214 mg/dL — ABNORMAL HIGH (ref 70–99)
Glucose-Capillary: 226 mg/dL — ABNORMAL HIGH (ref 70–99)
Glucose-Capillary: 24 mg/dL — CL (ref 70–99)
Glucose-Capillary: 46 mg/dL — ABNORMAL LOW (ref 70–99)
Glucose-Capillary: 56 mg/dL — ABNORMAL LOW (ref 70–99)
Glucose-Capillary: 67 mg/dL — ABNORMAL LOW (ref 70–99)
Glucose-Capillary: 87 mg/dL (ref 70–99)

## 2019-08-09 LAB — TRIGLYCERIDES: Triglycerides: 179 mg/dL — ABNORMAL HIGH (ref <150)

## 2019-08-09 LAB — PREALBUMIN: Prealbumin: 6.8 mg/dL — ABNORMAL LOW (ref 18–38)

## 2019-08-09 LAB — MAGNESIUM: Magnesium: 3 mg/dL — ABNORMAL HIGH (ref 1.7–2.4)

## 2019-08-09 LAB — CALCIUM, IONIZED
Calcium, Ionized, Serum: 3.9 mg/dL — ABNORMAL LOW (ref 4.5–5.6)
Calcium, Ionized, Serum: 4.1 mg/dL — ABNORMAL LOW (ref 4.5–5.6)

## 2019-08-09 MED ORDER — DEXTROSE 5 % IV SOLN: INTRAVENOUS | Status: DC

## 2019-08-09 MED ORDER — DEXTROSE 50 % IV SOLN
INTRAVENOUS | Status: AC
  Filled 2019-08-09: qty 50

## 2019-08-09 MED ORDER — DEXTROSE 50 % IV SOLN
1.0000 | Freq: Once | INTRAVENOUS | Status: AC
  Administered 2019-08-09: 50 mL via INTRAVENOUS

## 2019-08-09 MED ORDER — CHLORHEXIDINE GLUCONATE CLOTH 2 % EX PADS: 6.0000 | MEDICATED_PAD | Freq: Every day | CUTANEOUS | Status: DC

## 2019-08-09 MED ORDER — DEXTROSE 5 % IV SOLN
INTRAVENOUS | Status: DC
  Administered 2019-08-09: 08:00:00 via INTRAVENOUS

## 2019-08-09 MED ORDER — DEXTROSE 50 % IV SOLN
INTRAVENOUS | Status: AC
  Administered 2019-08-09: 50 mL
  Filled 2019-08-09: qty 50

## 2019-08-09 MED ORDER — ZINC CHLORIDE 1 MG/ML IV SOLN
INTRAVENOUS | Status: DC
  Administered 2019-08-09: 18:00:00 via INTRAVENOUS
  Filled 2019-08-09: qty 312

## 2019-08-09 MED ORDER — PIPERACILLIN-TAZOBACTAM 3.375 G IVPB
3.3750 g | Freq: Two times a day (BID) | INTRAVENOUS | Status: DC
  Administered 2019-08-09: 3.375 g via INTRAVENOUS
  Filled 2019-08-09 (×2): qty 50

## 2019-08-09 MED ORDER — DEXTROSE 5 % IV SOLN: INTRAVENOUS | Status: AC

## 2019-08-10 LAB — PATHOLOGIST SMEAR REVIEW

## 2019-08-11 LAB — SURGICAL PATHOLOGY

## 2019-08-16 ENCOUNTER — Telehealth: Payer: Self-pay

## 2019-08-16 NOTE — Telephone Encounter (Signed)
Received dc from [employer]  Dc is for burial and a patient of Doctor Olalere.   DC will be taken to [place] for signature.

## 2019-08-18 NOTE — Telephone Encounter (Signed)
Received original signed D/C-funeral home notified for pick up.  

## 2019-08-22 NOTE — Discharge Summary (Signed)
DEATH SUMMARY   Patient Details  Name: Alison Ruiz MRN: [id number] DOB: Jun 26, 1931  Admission/Discharge Information   Admit Date:  08-10-19  Date of Death: Date of Death: 13-Aug-2019  Time of Death: Time of Death: 12/17/1916  Length of Stay: 3  Referring Physician: Isaac Bliss, Rayford Halsted, MD   Reason(s) for Hospitalization  Admitted with abdominal pain, nausea and vomiting  Diagnoses  Preliminary cause of death: Septic shock Secondary Diagnoses (including complications and co-morbidities):  Active Problems:   Human immunodeficiency virus (HIV) disease (HCC)   Anxiety state   Dyslipidemia   S/P partial colectomy   Large bowel obstruction (HCC)   Goals of care, counseling/discussion   Palliative care by specialist   End of life care   [facility] Course (including significant findings, care, treatment, and services provided and events leading to death)  Alison Ruiz is a 85 y.o. year old female who admitted with abdominal pain and vomiting Failed medical management taken to the OR on 11/16 for exploratory laparotomy, found to have a colon mass. Developed a shocked state and progressively worsened Was not able to stabilize and following multiple conversations with family patient was made comfort measures only  Patient succumbed to her illness at 10 on Aug 13, 2019  Pertinent Labs and Studies  Significant Diagnostic Studies Ct Abdomen Pelvis Wo Contrast  Result Date: 08/10/19 CLINICAL DATA:  85 year old female with generalized abdominal pain and nausea since yesterday. EXAM: CT ABDOMEN AND PELVIS WITHOUT CONTRAST TECHNIQUE: Multidetector CT imaging of the abdomen and pelvis was performed following the standard protocol without IV contrast. COMPARISON:  Report of CT Abdomen and Pelvis 05/24/2001 (no images available). FINDINGS: Lower chest: Negative; minimal right lung base atelectasis or scarring. No cardiomegaly, pericardial or pleural effusion. Hepatobiliary: Possible  small round nonspecific 25 millimeter hypodense lesion of the lateral left hepatic lobe on coronal image 41. Otherwise negative noncontrast liver and gallbladder. Pancreas: Difficult to delineate, grossly negative. Spleen: Diminutive, negative. Adrenals/Urinary Tract: Adrenal glands are difficult to identified. Bilateral nephrolithiasis including a bulky 8-9 millimeter left upper pole calculus. But no hydronephrosis or perinephric stranding. The ureters are difficult to delineate. The urinary bladder seems to be completely decompressed. Stomach/Bowel: Diffusely fluid-filled dilated bowel loops throughout the abdomen and pelvis. Evidence of a decompressed rectum and a decompressed sigmoid colon along the left pelvic sidewall. In the left lower quadrant there is a small volume of what appears to be large bowel gas at the junction of the descending and sigmoid colon (coronal image 59). However, upstream of this the descending colon is indistinct and then the splenic flexure is dilated and fluid-filled (series 3, image 18). Dilated mostly fluid containing bowel loops upstream from that point all the way towards the proximal jejunum. Both the stomach and duodenum seem to be decompressed. A small to moderate gastric hiatal hernia is suspected. No definite pneumoperitoneum.  No definite free fluid. Vascular/Lymphatic: Vascular patency is not evaluated in the absence of IV contrast. Extensive Aortoiliac calcified atherosclerosis. Reproductive: Not identified, probably surgically absent. Other: No definite pelvic free fluid. Musculoskeletal: Osteopenia. No acute osseous abnormality identified. IMPRESSION: 1. High-grade bowel obstruction which appears to be at the level of the descending colon. The bowel in the region of the transition is poorly delineated in the absence of contrast, but in this age group consider an obstructing tumor. The rectum and sigmoid seem to be completely decompressed arguing against a distal large  bowel volvulus. A repeat study with contrast would be valuable when feasible. 2. No definite  free air or free fluid. 3. Suspected nonspecific 2.5 cm hypodense lesion in the left hepatic lobe. 4. Small hiatal hernia. Nephrolithiasis without obstructive uropathy. 5.  Aortic Atherosclerosis (ICD10-I70.0). Electronically Signed   By: Genevie Ann M.D.   On: 08/03/2019 19:57   Dg Abd 1 View  Result Date: 08/16/2019 CLINICAL DATA:  Bowel obstruction EXAM: ABDOMEN - 1 VIEW COMPARISON:  08/17/2019, CT 08/20/2019 FINDINGS: Esophageal tube tip is in the left upper quadrant of the abdomen. Persistent gaseous dilatation of the bowel. New left lower quadrant postsurgical changes with small amount of left lower quadrant gas. IMPRESSION: 1. New postsurgical changes in the left lower quadrant with sutures visible. There appears to be extraluminal gas in the left lower quadrant presumably due to recent surgery. 2. Continued small and large bowel dilatation. Electronically Signed   By: Donavan Foil M.D.   On: 08/21/2019 19:32   Am Dg Chest Port 1 View  Result Date: 18-Aug-2019 CLINICAL DATA:  Respiratory failure EXAM: PORTABLE CHEST 1 VIEW COMPARISON:  08/08/2019 FINDINGS: Cardiac shadow is stable. Gastric catheter is noted within the stomach. Endotracheal tube and left jugular central line are again seen and stable. Diffuse aortic calcifications are noted. Improved aeration in the right base is noted although new left retrocardiac atelectatic changes are seen. No other focal abnormality is noted. IMPRESSION: Resolution of previously seen right basilar atelectasis with new left retrocardiac atelectasis. Tubes and lines as described above stable from the prior exam. Electronically Signed   By: Inez Catalina M.D.   On: 08-18-2019 08:37   Dg Chest Port 1 View  Result Date: 08/08/2019 CLINICAL DATA:  Hypoxia EXAM: PORTABLE CHEST 1 VIEW COMPARISON:  August 07, 2019 FINDINGS: Endotracheal tube tip is 1.9 cm above the carina.  Central catheter tip is at the junction the left innominate vein and superior vena cava. Nasogastric tube tip and side port in stomach. No pneumothorax. There is mild atelectatic change in the right base. There is no edema or consolidation. Heart is mildly enlarged with pulmonary vascularity within normal limits. No adenopathy appreciable. There is aortic atherosclerosis. IMPRESSION: Tube and catheter positions as described without pneumothorax. Mild right base atelectasis. No edema or consolidation. Stable cardiac prominence. Aortic Atherosclerosis (ICD10-I70.0). Electronically Signed   By: Lowella Grip III M.D.   On: 08/08/2019 06:57   Dg Chest Port 1 View  Result Date: 07/25/2019 CLINICAL DATA:  85 year old female with acute respiratory failure. Acute kidney injury. HIV. EXAM: PORTABLE CHEST 1 VIEW COMPARISON:  1517 hours today and earlier. FINDINGS: Portable AP semi upright view at 2211 hours. Endotracheal tube tip in good position between the level the clavicles and carina. Stable left IJ approach central line. Stable enteric tube, side hole at the level of the gastric cardia. Mildly lower lung volumes with patchy right lung base and perihilar opacity most resembling atelectasis. No pneumothorax or pulmonary edema. No definite pleural effusion. Stable cardiac size and mediastinal contours. Negative visible bowel gas pattern. No acute osseous abnormality identified. IMPRESSION: 1. Stable lines and tubes. 2. Mildly lower lung volumes with right lung base and perihilar opacity most resembling atelectasis. Electronically Signed   By: Genevie Ann M.D.   On: 08/14/2019 22:23   Portable Chest X-ray  Result Date: 08/07/2019 CLINICAL DATA:  Postop partial colectomy and colostomy. Intubated patient. EXAM: PORTABLE CHEST 1 VIEW COMPARISON:  Chest radiographs 01/21/2019 and 01/19/2019. Chest CT 01/21/2019 and abdominal CT 08/13/2019. FINDINGS: 1517 hours. Tip of the endotracheal tube is approximately 3.6 cm  above  the carina. Left IJ central venous catheter projects to the level of the mid SVC. A nasogastric tube projects below the diaphragm with tip in the left upper quadrant of the abdomen. Patient is mildly rotated to the right. There is mild atelectasis at the right lung base. The left lung is clear. There is no pleural effusion or pneumothorax. The heart size is stable. There is diffuse aortic atherosclerosis. IMPRESSION: 1. Support system positioning as described. Mild right basilar atelectasis. 2. No evidence of pneumothorax or significant pleural effusion. Electronically Signed   By: Richardean Sale M.D.   On: 07/31/2019 15:38   Dg Abd Portable 1 View  Result Date: 08/19/2019 CLINICAL DATA:  85 year old female NG tube placement. EXAM: PORTABLE ABDOMEN - 1 VIEW COMPARISON:  Portable chest 0126 hours. FINDINGS: AP view at 0246 hours. Similar to the prior portable chest, and enteric tube courses to the proximal stomach but then is looped back upon itself continuing retrograde back up the esophagus. The tip is not included on this image. Stable visible lung bases and upper abdomen otherwise. Stable bowel gas pattern. IMPRESSION: Enteric tube again looped upon itself at the level of the proximal stomach, and extending retrograde back in the esophagus. Technologist reports the RN removed the tube after this image was obtained. Electronically Signed   By: Genevie Ann M.D.   On: 07/31/2019 03:22   Dg Abd Portable 1 View  Result Date: 07/31/2019 CLINICAL DATA:  NG tube EXAM: PORTABLE ABDOMEN - 1 VIEW COMPARISON:  CT earlier today FINDINGS: The NG tube coils in the fundus of the stomach, then reentered the esophagus. The tip of the NG tube is in the upper thoracic esophagus. Dilated bowel loops noted in the upper abdomen. Visualized lungs clear. IMPRESSION: NG tube coils in the fundus of the stomach, and re-entered the esophagus with the tip in the upper thoracic esophagus. Electronically Signed   By: Rolm Baptise  M.D.   On: 08/17/2019 01:42    Microbiology Recent Results (from the past 240 hour(s))  MRSA PCR Screening     Status: None   Collection Time: 08/19/2019  2:20 PM   Specimen: Nasal Mucosa; Nasopharyngeal  Result Value Ref Range Status   MRSA by PCR NEGATIVE NEGATIVE Final    Comment:        The GeneXpert MRSA Assay (FDA approved for NASAL specimens only), is one component of a comprehensive MRSA colonization surveillance program. It is not intended to diagnose MRSA infection nor to guide or monitor treatment for MRSA infections. Performed at [facility], [place] [address], [place], [place] [address]     Lab Basic Metabolic Panel: No results for input(s): NA, K, CL, CO2, GLUCOSE, BUN, CREATININE, CALCIUM, MG, PHOS in the last 168 hours. Liver Function Tests: No results for input(s): AST, ALT, ALKPHOS, BILITOT, PROT, ALBUMIN in the last 168 hours. No results for input(s): LIPASE, AMYLASE in the last 168 hours. No results for input(s): AMMONIA in the last 168 hours. CBC: No results for input(s): WBC, NEUTROABS, HGB, HCT, MCV, PLT in the last 168 hours. Cardiac Enzymes: No results for input(s): CKTOTAL, CKMB, CKMBINDEX, TROPONINI in the last 168 hours. Sepsis Labs: No results for input(s): PROCALCITON, WBC, LATICACIDVEN in the last 168 hours.  Procedures/Operations  Exploratory laparotomy 11/16   Zailey Audia A Jerrell Mangel 08/17/2019, 11:27 AM

## 2019-08-22 NOTE — Progress Notes (Signed)
[facility] Progress Note  2 Days Post-Op  Subjective: CC: shock Patient requiring pressors, no longer responsive on the vent. Urine appears very dark/concentrated. Stool output has slowed significantly since yesterday but is bloody. Per RN patient's family made her a DNR yesterday.   Objective: Vital signs in last 24 hours: Temp:  [97.6 F (36.4 C)-103.1 F (39.5 C)] 98.2 F (36.8 C) (11/18 0800) Pulse Rate:  [36-144] 36 (11/18 0600) Resp:  [19-28] 23 (11/18 0600) BP: (87-127)/(28-57) 87/55 (11/18 0400) SpO2:  [67 %-100 %] 67 % (11/18 0600) Arterial Line BP: (77-136)/(46-71) 80/60 (11/18 0600) FiO2 (%):  [60 %-100 %] 60 % (11/18 0401) Last BM Date: 08/08/19  Intake/Output from previous day: 11/17 0701 - 11/18 0700 In: 4385.7 [I.V.:2882.7; Blood:226; IV Piggyback:1277.1] Out: [id number] [Urine:3585; Emesis/NG output:400; Drains:50; Stool:1210] Intake/Output this shift: Total I/O In: 158 [I.V.:158] Out: -   PE: Gen:  unresponsive on the vent Cards: tachycardia Resp:  Intubated, soft rales RLL Abd: Soft, non-tender, VAC to midline, stoma in LLQ with necrotic top layer, bloody stool in ostomy bag, +BS Skin: warm and dry, no rashes   Lab Results:  Recent Labs    08/08/19 1458 08/08/19 1806 16-Aug-2019 0443  WBC 9.2  --  19.7*  HGB 10.3* 7.1* 13.0  HCT 28.5* 21.0* 37.0  PLT 71*  --  31*   BMET Recent Labs    08/08/19 1751 08/08/19 1806 2019/08/16 0443  NA 149* 148* 151*  K 4.9 5.1 5.3*  CL 110  --  113*  CO2 27  --  22  GLUCOSE 101*  --  96  BUN 72*  --  67*  CREATININE 2.27*  --  2.49*  CALCIUM 7.1*  --  8.2*   PT/INR No results for input(s): LABPROT, INR in the last 72 hours. CMP     Component Value Date/Time   NA 151 (H) 08-16-2019 0443   K 5.3 (H) 08-16-19 0443   CL 113 (H) 08/16/19 0443   CO2 22 08-16-2019 0443   GLUCOSE 96 08-16-2019 0443   BUN 67 (H) 2019-08-16 0443   CREATININE 2.49 (H) 08/16/19 0443   CREATININE 0.95 (H)  07/05/2019 0915   CALCIUM 8.2 (L) 08/16/19 0443   PROT 4.2 (L) Aug 16, 2019 0443   ALBUMIN 2.2 (L) 2019-08-16 0443   AST 305 (H) 2019/08/16 0443   ALT 54 (H) 2019/08/16 0443   ALKPHOS 172 (H) Aug 16, 2019 0443   BILITOT 5.2 (H) 08-16-2019 0443   GFRNONAA 17 (L) 2019-08-16 0443   GFRNONAA 55 (L) 04/13/2011 1648   GFRAA 20 (L) 08/16/19 0443   GFRAA >60 04/13/2011 1648   Lipase     Component Value Date/Time   LIPASE 13 07/25/2019 1719       Studies/Results: Dg Abd 1 View  Result Date: 08/15/2019 CLINICAL DATA:  Bowel obstruction EXAM: ABDOMEN - 1 VIEW COMPARISON:  08/04/2019, CT 08/04/2019 FINDINGS: Esophageal tube tip is in the left upper quadrant of the abdomen. Persistent gaseous dilatation of the bowel. New left lower quadrant postsurgical changes with small amount of left lower quadrant gas. IMPRESSION: 1. New postsurgical changes in the left lower quadrant with sutures visible. There appears to be extraluminal gas in the left lower quadrant presumably due to recent surgery. 2. Continued small and large bowel dilatation. Electronically Signed   By: Donavan Foil M.D.   On: 08/01/2019 19:32   Am Dg Chest Port 1 View  Result Date: August 16, 2019 CLINICAL DATA:  Respiratory failure EXAM: PORTABLE CHEST  1 VIEW COMPARISON:  08/08/2019 FINDINGS: Cardiac shadow is stable. Gastric catheter is noted within the stomach. Endotracheal tube and left jugular central line are again seen and stable. Diffuse aortic calcifications are noted. Improved aeration in the right base is noted although new left retrocardiac atelectatic changes are seen. No other focal abnormality is noted. IMPRESSION: Resolution of previously seen right basilar atelectasis with new left retrocardiac atelectasis. Tubes and lines as described above stable from the prior exam. Electronically Signed   By: Inez Catalina M.D.   On: 08-25-2019 08:37   Dg Chest Port 1 View  Result Date: 08/08/2019 CLINICAL DATA:  Hypoxia EXAM:  PORTABLE CHEST 1 VIEW COMPARISON:  August 07, 2019 FINDINGS: Endotracheal tube tip is 1.9 cm above the carina. Central catheter tip is at the junction the left innominate vein and superior vena cava. Nasogastric tube tip and side port in stomach. No pneumothorax. There is mild atelectatic change in the right base. There is no edema or consolidation. Heart is mildly enlarged with pulmonary vascularity within normal limits. No adenopathy appreciable. There is aortic atherosclerosis. IMPRESSION: Tube and catheter positions as described without pneumothorax. Mild right base atelectasis. No edema or consolidation. Stable cardiac prominence. Aortic Atherosclerosis (ICD10-I70.0). Electronically Signed   By: Lowella Grip III M.D.   On: 08/08/2019 06:57   Dg Chest Port 1 View  Result Date: 08/21/2019 CLINICAL DATA:  85 year old female with acute respiratory failure. Acute kidney injury. HIV. EXAM: PORTABLE CHEST 1 VIEW COMPARISON:  1517 hours today and earlier. FINDINGS: Portable AP semi upright view at 2211 hours. Endotracheal tube tip in good position between the level the clavicles and carina. Stable left IJ approach central line. Stable enteric tube, side hole at the level of the gastric cardia. Mildly lower lung volumes with patchy right lung base and perihilar opacity most resembling atelectasis. No pneumothorax or pulmonary edema. No definite pleural effusion. Stable cardiac size and mediastinal contours. Negative visible bowel gas pattern. No acute osseous abnormality identified. IMPRESSION: 1. Stable lines and tubes. 2. Mildly lower lung volumes with right lung base and perihilar opacity most resembling atelectasis. Electronically Signed   By: Genevie Ann M.D.   On: 07/28/2019 22:23   Portable Chest X-ray  Result Date: 07/29/2019 CLINICAL DATA:  Postop partial colectomy and colostomy. Intubated patient. EXAM: PORTABLE CHEST 1 VIEW COMPARISON:  Chest radiographs 01/21/2019 and 01/19/2019. Chest CT  01/21/2019 and abdominal CT 08/17/2019. FINDINGS: 1517 hours. Tip of the endotracheal tube is approximately 3.6 cm above the carina. Left IJ central venous catheter projects to the level of the mid SVC. A nasogastric tube projects below the diaphragm with tip in the left upper quadrant of the abdomen. Patient is mildly rotated to the right. There is mild atelectasis at the right lung base. The left lung is clear. There is no pleural effusion or pneumothorax. The heart size is stable. There is diffuse aortic atherosclerosis. IMPRESSION: 1. Support system positioning as described. Mild right basilar atelectasis. 2. No evidence of pneumothorax or significant pleural effusion. Electronically Signed   By: Richardean Sale M.D.   On: 08/08/2019 15:38    Anti-infectives: Anti-infectives (From admission, onward)   Start     Dose/Rate Route Frequency Ordered Stop   08/14/2019 1800  Ampicillin-Sulbactam (UNASYN) 3 g in sodium chloride 0.9 % 100 mL IVPB     3 g 200 mL/hr over 30 Minutes Intravenous Every 24 hours 07/29/2019 1642 08/12/19 1759   08/08/2019 0830  cefoTEtan (CEFOTAN) 2 g in sodium chloride  0.9 % 100 mL IVPB    Note to Pharmacy: preop area [place] to adjust dose   2 g 200 mL/hr over 30 Minutes Intravenous  Once 08/19/2019 0808 08/08/19 1031       Assessment/Plan HIV AKI - Cr 2.5, UOP increasing, continue to monitor VDRF/aspiration - vent per CCM, appreciate assistance Elevated LFTs - ?shock  Lactic acidosis - 4.8 last night Septic shock - Tmax 103, WBC 19, requiring pressors ABL anemia - s/p 2 units PRBC 11/17, hgb 13 this AM  Descending colon mass S/p exploratory laparotomy, partial colectomy and colostomy - POD#2 - path pending  - stool output has slowed significantly, some bloody appearing stool in bag - NGT output slowing as well - VAC T/TR/Sat - overall prognosis seems very poor, family made DNR yesterday   FEN: NPO, TPN, IVF; NGT on LIWS VTE: SQ heparin held with bloody stool ID:  cefotetan pre-op; unasyn 11/16>>  LOS: 3 days    Brigid Re , [facility] 2019/08/24, 8:42 AM Please see Amion for pager number during day hours 7:00am-4:30pm

## 2019-08-22 NOTE — Progress Notes (Signed)
CDS notified of impending death. Referral number [id number]. Call back with cardiac TOD.

## 2019-08-22 NOTE — Progress Notes (Addendum)
NAME:  Alison Ruiz, MRN:  [id number], DOB:  Apr 29, 1931, LOS: 3 ADMISSION DATE:  07/28/2019, CONSULTATION DATE:  08/11/2019 REFERRING MD:  Dr. Ninfa Linden, CHIEF COMPLAINT:  abd pain/ N/V   Brief History   85 year old female with significant hx of HIV presenting with abdominal pain, nausea, and vomiting found to have large bowel obstruction in the descending colon.  Failed medical management, ongoing nausea/ vomiting and noted to having pooling of gastric secretions during RSI prior to going to OR on 11/16 for partial colectomy and colostomy.  Returns to ICU sedated and intubated.   History of present illness   HPI obtained from medical chart review as patient is sedated/ intubated on mechanical ventilator.    85 year old female with prior history as below presenting from home with five day history of nausea, vomiting, and abdominal pain.  Patient apparently lives alone.  Found to have AKI and CT scan to have findings consistent with large bowel obstruction in the descending colon.  She was admitted to [facility] however failed medical management.  NGT unable to be placement with ongoing emesis.  At time of RSI in OR, noted to having pooling of gastric secretions in posterior pharynx.  Taken to OR on 11/16 for exploratory laparotomy with partial colectomy and colostomy; fasica was closed, skin remains open.  Post-operatively returns to ICU sedated on mechanical ventilation.   Past Medical History  Never smoker HIV/ hep B, anxiety, normocytic anemia, protein calorie malnutrition, LBBB/ syncope/ bradycardia, HLD,   Significant Hospital Events   11/15 Admitted [place] 11/16 OR  Consults:  CCS  Procedures:  11/16 ETT >> 11/16 R CVL IJ >> 11/16 OR ->ex lap, partial colectomy and colostomy  11/17 wound vac placed, worsening shock  Significant Diagnostic Tests:  11/15 CT abd/ pelvis >> 1. High-grade bowel obstruction which appears to be at the level of the descending colon. The bowel in the region of  the transition is poorly delineated in the absence of contrast, but in this age group consider an obstructing tumor.  The rectum and sigmoid seem to be completely decompressed arguing against a distal large bowel volvulus.  A repeat study with contrast would be valuable when feasible. 2. No definite free air or free fluid. 3. Suspected nonspecific 2.5 cm hypodense lesion in the left hepatic lobe. 4. Small hiatal hernia. Nephrolithiasis without obstructive uropathy. 5.  Aortic Atherosclerosis   Micro Data:  11/15 SARS CoV2 >> neg 11/16 MRSA PCR >> neg  Antimicrobials:  11/16 cefotetan preop 11/16 unasyn >>  Interim history/subjective:  Continue decline overnight, actively dying Remains hypotensive despite levo and neo, ongoing hypoglycemia, new fevers overnight with worsening leukocytosis.    Stool output from ostomy site remains bloody, decreasing output  Objective   Blood pressure (!) 87/55, pulse (!) 122, temperature 98.2 F (36.8 C), temperature source Axillary, resp. rate (!) 23, height 5\' 2"  (1.575 m), weight 39.2 kg, SpO2 (!) 67 %. CVP:  [3 mmHg-6 mmHg] 3 mmHg  Vent Mode: PRVC FiO2 (%):  [60 %-100 %] 60 % Set Rate:  [22 bmp] 22 bmp Vt Set:  [400 mL] 400 mL PEEP:  [5 cmH20] 5 cmH20 Plateau Pressure:  [11 cmH20-18 cmH20] 17 cmH20   Intake/Output Summary (Last 24 hours) at 08-12-2019 0909 Last data filed at August 12, 2019 0800 Gross per 24 hour  Intake 4543.76 ml  Output 5185 ml  Net -641.24 ml   Filed Weights   08/12/2019 0336 08/08/19 0451  Weight: 39.3 kg 39.2 kg  Examination: remains on precedex 0.8 and fentanyl gtt at 100 mcg/min General:  Ill appearing cachectic female agonal on MV HEENT: MM pale/ moist- tip of tongue is purple, ETT/ OGT, left pupils 2, right pupils is 4/minimally reactive, scleral edema is icteric  Neuro: unresponsive CV: rr ir, tachy,  PULM:  Agonal on MV, coarse with faint RLL rales GI: midline wound with wound vac, left sided colostomy- stoma  not visualized 2/2 bloody stools, foley with dark amber/ ?blood tinged urine  Extremities: cool/ dry, fingertips/ toes becoming black  Skin: no rashes   Resolved Hospital Problem list    Assessment & Plan:    Acute respiratory insufficiency Suspected aspiration  - CXR 11/18 with new left retrocardiac atelectasis, resolution of right basilar atelectasis; stable lines/ ETT P:  Full MV support No SBT given instability Continue abx as below  PAD protocol with fentanyl gtt/ precedex  Shock- likely multifactorial- septic/ hypovolemic w/ component of worsening anemia - prior TTE 01/20/2019 EF 45-50, mildly reduced RV systolic function, mild/mod TR, myocardium of LV suspicious for noncompaction P:  Tele monitor Was made DNR 11/18; ongoing decline despite maximum medical therapy on two pressors with progressive MODS.  Suspect she could pass at any point.  Will hold on any escalation of care.  Patient is actively dying.  Patient's sister/ family called to bedside.  Will talk to family about possible transition to comfort care on arrival.  Will change abx from unasyn to zosyn for broader coverage.    Large bowel obstruction in descending colon s/p partial colectomy and colostomy 11/16 Noted to have stool coming from NG tube 11/17, bloody output and black stoma P:  Per CCS, appreciate care Pending pathology from mass  NPO   AKI  AGMA/ lactic acidosis  Hypernatremia Mild hyperkalemia  Lab Results  Component Value Date   CREATININE 2.49 (H) 08/18/19   CREATININE 2.27 (H) 08/08/2019   CREATININE 2.48 (H) 08/08/2019   CREATININE 0.95 (H) 07/05/2019   CREATININE 1.12 (H) 03/20/2019   CREATININE 1.11 (H) 02/20/2019   Recent Labs  Lab 08/08/19 1751 08/08/19 1806 08/18/2019 0443  K 4.9 5.1 5.3*    P:  D5 at 75 ml Trend BMP / urinary output Replace electrolytes as indicated   Hyperglycemia now hypoglycemic  CBG (last 3)  Recent Labs    08/18/19 0043 08/18/19 0448  08-18-2019 0744  GLUCAP 214* 87 24*   P:  S/p D50, MIVF as above with dextrose D/c SSI  HIV  - followed by Dr. Megan Salon - 07/05/2019 CD4 18, RNA 103  P:  Continue to hold age medications for now  Anemia  Worsening thrombocytopenia P:  Trend CBC, transfuse for Hgb <7 If family wants to continue aggressive care, consider DIC panel   Best practice:  Diet: NPO Pain/Anxiety/Delirium protocol (if indicated): RASS goal 0-/1, fentanyl/ precedex gtt VAP protocol (if indicated): yes DVT prophylaxis: SCDs, GI prophylaxis: PPI Glucose control: trend CBG Mobility: BR Code Status: DNR Family Communication: Spoke with sister, Luanna Salk given progressive decline, will come be at bedside with patient Disposition: ICU  Labs   CBC: Recent Labs  Lab 08/13/2019 0537 08/12/2019 1604  08/08/19 0600 08/08/19 0807 08/08/19 1458 08/08/19 1806 08/18/19 0443  WBC 6.3 5.4  --  7.3  --  9.2  --  19.7*  NEUTROABS  --   --   --   --   --   --   --  15.8*  HGB 13.4 10.6*   < > 10.4*  8.2* 10.3* 7.1* 13.0  HCT 38.5 30.7*   < > 28.7* 24.0* 28.5* 21.0* 37.0  MCV 90.4 90.8  --  85.9  --  86.4  --  92.5  PLT 246 225  --  95*  --  71*  --  31*   < > = values in this interval not displayed.    Basic Metabolic Panel: Recent Labs  Lab 07/25/2019 1604 07/30/2019 2132 08/08/19 0417 08/08/19 0744 08/08/19 0807 08/08/19 1751 08/08/19 1806 08-20-19 0443  NA 140 145   146* 147* 147* 146* 149* 148* 151*  K 6.0* 5.1   5.0 5.3* 4.8 4.7 4.9 5.1 5.3*  CL 110 108 111 108  --  110  --  113*  CO2 14* 19* 22 22  --  27  --  22  GLUCOSE 176* 118* 69* 77  --  101*  --  96  BUN 77* 76* 80* 80*  --  72*  --  67*  CREATININE 2.77* 2.67* 2.50* 2.48*  --  2.27*  --  2.49*  CALCIUM 7.5* 7.2* 7.6* 7.5*  --  7.1*  --  8.2*  MG 2.3  --   --   --   --  1.6*  --  3.0*  PHOS 7.5*  --  3.3  --   --  2.5  --  4.2   GFR: Estimated Creatinine Clearance: 10.2 mL/min (A) (by C-G formula based on SCr of 2.49 mg/dL (H)). Recent  Labs  Lab 08/10/2019 1604 08/15/2019 1900 08/08/19 0008 08/08/19 0600 08/08/19 0744 08/08/19 1045 08/08/19 1458 08-20-19 0443  WBC 5.4  --   --  7.3  --   --  9.2 19.7*  LATICACIDVEN 6.8* 8.3* 5.5*  --  4.0* 4.8*  --   --     Liver Function Tests: Recent Labs  Lab 08/20/2019 1719 08/06/2019 0537 08/19/2019 1604 08/08/19 0417 08/08/19 0744 2019/08/20 0443  AST 19 28  --   --  114* 305*  ALT 14 15  --   --  31 54*  ALKPHOS 67 63  --   --  94 172*  BILITOT 1.6* 2.1*  --   --  5.2* 5.2*  PROT 7.7 6.8  --   --  4.5* 4.2*  ALBUMIN 3.7 3.4* 3.4* 2.7* 2.7* 2.2*   Recent Labs  Lab 08/04/2019 1719  LIPASE 13   No results for input(s): AMMONIA in the last 168 hours.  ABG    Component Value Date/Time   PHART 7.539 (H) 08/08/2019 1806   PCO2ART 38.1 08/08/2019 1806   PO2ART 106.0 08/08/2019 1806   HCO3 32.6 (H) 08/08/2019 1806   TCO2 34 (H) 08/08/2019 1806   ACIDBASEDEF 9.0 (H) 08/17/2019 2132   O2SAT 99.0 08/08/2019 1806     Coagulation Profile: No results for input(s): INR, PROTIME in the last 168 hours.  Cardiac Enzymes: No results for input(s): CKTOTAL, CKMB, CKMBINDEX, TROPONINI in the last 168 hours.  HbA1C: Hgb A1c MFr Bld  Date/Time Value Ref Range Status  07/24/2019 04:04 PM 5.2 4.8 - 5.6 % Final    Comment:    (NOTE) Pre diabetes:          5.7%-6.4% Diabetes:              >6.4% Glycemic control for   <7.0% adults with diabetes   01/17/2019 07:13 PM 6.5 (H) 4.8 - 5.6 % Final    Comment:    (NOTE) Pre diabetes:  5.7%-6.4% Diabetes:              >6.4% Glycemic control for   <7.0% adults with diabetes     CBG: Recent Labs  Lab 08/08/19 1947 2019/08/21 0026 2019-08-21 0043 Aug 21, 2019 0448 Aug 21, 2019 0744  GLUCAP 78 67* 214* 87 24*     Critical care time: 40 mins     Kennieth Rad, MSN, AGACNP-BC [facility] 2019-08-21, 9:58 AM

## 2019-08-22 NOTE — Progress Notes (Signed)
Hypoglycemic Event  CBG: 67  Treatment: D50 50 mL (25 gm)  Symptoms: None  Follow-up CBG: [id number] CBG Result:214  Possible Reasons for Event: Unknown  Comments/MD notified:protocol    [name]

## 2019-08-22 NOTE — Progress Notes (Signed)
274ml of Fentanyl wasted by this RN and Allegra Grana, RN

## 2019-08-22 NOTE — Progress Notes (Signed)
Sister and niece took pt belongings home.

## 2019-08-22 NOTE — Consult Note (Addendum)
[place] Nurse ostomy follow up Assessed colostomy pouch seal with PA at the bedside.  Current pouch was applied yesterday and is intact with good seal.  Mod amt blood-tinged liquid brown stool. Plan for pouch change on Fri when Vac is changed.  Supplies at the bedside for staff nurse use PRN if leaking.  Abd wound Vac intact with good seal, 133mm cont suction. Mod amt pink drainage in the cannister. Dressing was changed yesterday; plan to change on Friday.  Julien Girt MSN, RN, [place], [place], [place]

## 2019-08-22 NOTE — Progress Notes (Signed)
Pharmacy Antibiotic Note  Alison Ruiz is a 85 y.o. female admitted on 07/23/2019 with n/v/abdominal pain and found to have a mass on her colon and is now s/p partial colectomy on 11/15. Pharmacy has been consulted for Zosyn dosing for broader coverage from Unasyn. She is afebrile but has a Tmax of 101.8/past 24 hours and has a white count of 19.7. She has been on Unasyn for 2 days. Her serum creatinine is 2.49 and has trended down, her CrCl is around 10 ml/min (likely underestimated due to her age and weight) she has positive urine output.   Plan: Zosyn 3.375 g IV q12h (4 hour infusion) Stop Unasyn  Follow up for clinical improvement and renal function   Height: 5\' 2"  (157.5 cm) Weight: 86 lb 6.7 oz (39.2 kg) IBW/kg (Calculated) : 50.1  Temp (24hrs), Avg:100 F (37.8 C), Min:97.6 F (36.4 C), Max:103.1 F (39.5 C)  Recent Labs  Lab 08/04/2019 0537 07/23/2019 1604 07/23/2019 1900 08/14/2019 2132 08/08/19 0008 08/08/19 0417 08/08/19 0600 08/08/19 0744 08/08/19 1045 08/08/19 1458 08/08/19 1751 09-03-19 0443  WBC 6.3 5.4  --   --   --   --  7.3  --   --  9.2  --  19.7*  CREATININE 2.45* 2.77*  --  2.67*  --  2.50*  --  2.48*  --   --  2.27* 2.49*  LATICACIDVEN  --  6.8* 8.3*  --  5.5*  --   --  4.0* 4.8*  --   --   --     Estimated Creatinine Clearance: 10.2 mL/min (A) (by C-G formula based on SCr of 2.49 mg/dL (H)).    No Known Allergies  Antimicrobials this admission: Unasyn 11/16 >> 11/17  Thank you for allowing pharmacy to be a part of this patient's care.  Eddie Candle, PharmD PGY-1 Pharmacy Resident   Please check amion for clinical pharmacist contact number   09-03-19 11:33 AM

## 2019-08-22 NOTE — Progress Notes (Signed)
Multiple pulse ox sensors and sites (fingers, ears, forehead) attempted throughout the night to access pt SpO2 without consistent success.  Will continue to monitor closely.

## 2019-08-22 NOTE — Progress Notes (Signed)
Pt asystole on monitor.  TOD pronounced by this RN and Cristopher Peru, RN.  CDS notified of TOD as well as elink.  Family at bedside.

## 2019-08-22 NOTE — Consult Note (Signed)
Consultation Note Date: 08/13/19   Patient Name: Alison Ruiz  DOB: 24-Oct-1930  MRN: [id number]  Age / Sex: 85 y.o., female  PCP: Isaac Bliss, Rayford Halsted, MD Referring Physician: Julian Hy, DO  Reason for Consultation: Establishing goals of care and Psychosocial/spiritual support  HPI/Patient Profile: 85 y.o. female  with past medical history of HIV presenting with abdominal pain, nausea, and vomiting admitted on 08/13/2019 with found to have large bowel obstruction in the descending colon.  Failed medical management, ongoing nausea/ vomiting and noted to having pooling of gastric secretions during RSI prior to going to OR on 11/16 for partial colectomy and colostomy.  Returns to ICU sedated and intubated.   Clinical Assessment and Goals of Care: Alison Ruiz is lying in bed ventilated and sedated.  She appears to be actively dying.  Present today at bedside is Alison Ruiz, niece Alison Ruiz, former husband Alison Ruiz are at bedside. Also present today is Alison Ruiz's employer, Alison Ruiz who states that Alison Ruiz is "like a mother to she and her husband.  She shares that Alison Ruiz has worked for [employer] for 23 years  Family shares that Alison Ruiz had been working 12-hour days, 3 days a week until last week.  They talk about her love of Christmas, how much she enjoyed her home.  Niece Alison Ruiz shows pictures of Alison Ruiz's home decorated for Christmas.  Alison Ruiz states that Alison Ruiz told him last year that she thought that was her last Christmas.  We talked about her acute health problems, but not her chronic health concerns.  We talked about comfort and dignity, family has the right to say what this time looks like and feels like for Alison Ruiz.  Family shares that they do not want to be responsible for increasing pain medication or doing anything to change the current course of treatment.  We talked about  pain post surgery, discomfort with endotracheal tube.  We also talked about how nursing staff can interpret pain without someone being able to speak including increased heart rate, respiratory rate, work of breathing.  We talked about anticipated hospital death even with full course treatment.  I share that it would not be surprising for Alison Ruiz to pass within the next few hours.  I encourage family to call with any questions, reach out to nursing staff if they feel that Alison Ruiz has any needs.   I see family in the hallway going for something to eat, they continue to deny questions or concerns.   I returned to the room to check on Alison Ruiz and find Alison Ruiz still at bedside.  She is very tearful stating that she believes Alison Ruiz family is "in denial".   Alison Ruiz states that Alison Ruiz is "like a mother" to her and her husband.  She shares that Alison Ruiz has worked for [employer] for 23 years.  Conference with bedside nursing staff related to patient condition, needs, goals of care discussion with family.  Psychosocial support provided as Alison Ruiz is  clearly dying, and staff is unable to provide comfort and dignity.   HCPOA    NEXT OF KIN -Alison., niece Alison Ruiz, former husband Alison Ruiz are at bedside. Also present today is Ms. Ruiz's employer, Alison Ruiz who states that Alison Ruiz is "like a mother" to her and her husband.  She shares that Alison Ruiz has worked for [employer] for 23 years.    SUMMARY OF RECOMMENDATIONS   Continue to treat the treatable but no CPR Family declines comfort measures Anticipate hospital death in the next few hours   Code Status/Advance Care Planning:  DNR  Symptom Management:   Per CCM/surgery, adequate symptom management available.  Palliative Prophylaxis:   Frequent Pain Assessment  Additional Recommendations (Limitations, Scope, Preferences):  Treat the treatable but no CPR, no intubation  Psycho-social/Spiritual:   Desire for  further Chaplaincy support:no  Additional Recommendations: Education on Hospice  Prognosis:   Hours - Days  Discharge Planning: Anticipated Hospital Death      Primary Diagnoses: Present on Admission: . Human immunodeficiency virus (HIV) disease ([place]) . Anxiety state . Dyslipidemia   I have reviewed the medical record, interviewed the patient and family, and examined the patient. The following aspects are pertinent.  Past Medical History:  Diagnosis Date  . Abnormal serum level of alkaline phosphatase 01/18/2019  . AKI (acute kidney injury) ([place]) 12/2018  . GRIEF REACTION, ACUTE 05/24/2008   Annotation: after brother's death 10-24-22 Qualifier: Diagnosis of  By: Alison Salon MD, John    . HIV (human immunodeficiency virus infection) ([place])   . Loss of consciousness ([place]) 12/2018   Social History   Socioeconomic History  . Marital status: Widowed    Spouse name: Not on file  . Number of children: Not on file  . Years of education: Not on file  . Highest education level: Not on file  Occupational History  . Not on file  Social Needs  . Financial resource strain: Not on file  . Food insecurity    Worry: Not on file    Inability: Not on file  . Transportation needs    Medical: Not on file    Non-medical: Not on file  Tobacco Use  . Smoking status: Never Smoker  . Smokeless tobacco: Never Used  Substance and Sexual Activity  . Alcohol use: No  . Drug use: No  . Sexual activity: Not Currently  Lifestyle  . Physical activity    Days per week: Not on file    Minutes per session: Not on file  . Stress: Not on file  Relationships  . Social Herbalist on phone: Not on file    Gets together: Not on file    Attends religious service: Not on file    Active member of club or organization: Not on file    Attends meetings of clubs or organizations: Not on file    Relationship status: Not on file  Other Topics Concern  . Not on file  Social History Narrative  . Not  on file   History reviewed. No pertinent family history. Scheduled Meds: . chlorhexidine gluconate (MEDLINE KIT)  15 mL Mouth Rinse BID  . Chlorhexidine Gluconate Cloth  6 each Topical Daily  . dextrose      . dextrose      . mouth rinse  15 mL Mouth Rinse 10 times per day  . pantoprazole (PROTONIX) IV  40 mg Intravenous Daily   Continuous Infusions: . sodium chloride 10 mL/hr at 09-03-19 1151  .  dexmedetomidine (PRECEDEX) IV infusion 0.8 mcg/kg/hr (08-31-2019 0951)  . dextrose 75 mL/hr at 08-31-19 1139  . dextrose    . fentaNYL infusion INTRAVENOUS 100 mcg/hr (August 31, 2019 1117)  . norepinephrine (LEVOPHED) Adult infusion 40 mcg/min (08-31-2019 0951)  . phenylephrine (NEO-SYNEPHRINE) Adult infusion 100 mcg/min (31-Aug-2019 1430)  . piperacillin-tazobactam (ZOSYN)  IV 3.375 g (08-31-19 1152)  . TPN ADULT (ION)     PRN Meds:.sodium chloride, acetaminophen, albuterol, fentaNYL (SUBLIMAZE) injection, fentaNYL (SUBLIMAZE) injection Medications Prior to Admission:  Prior to Admission medications   Medication Sig Start Date End Date Taking? Authorizing Provider  acetaminophen (TYLENOL) 325 MG tablet Take 325 mg by mouth every 6 (six) hours as needed (for pain).    Yes [provider]  Darunavir-Cobicisctat-Emtricitabine-Tenofovir Alafenamide (SYMTUZA) 800-150-200-10 MG TABS Take 1 tablet by mouth daily with breakfast. 02/20/19  Yes Michel Bickers, MD  dolutegravir (TIVICAY) 50 MG tablet Take 1 tablet (50 mg total) by mouth daily. 02/20/19  Yes Michel Bickers, MD  ferrous sulfate 325 (65 FE) MG tablet Take 1 tablet (325 mg total) by mouth daily for 30 days. 02/20/19 07/30/2019 Yes Michel Bickers, MD  ondansetron (ZOFRAN) 4 MG tablet Take 4 mg by mouth every 8 (eight) hours as needed for nausea or vomiting.   Yes [provider]  sulfamethoxazole-trimethoprim (BACTRIM DS) 800-160 MG tablet Take 1 tablet by mouth daily. 02/20/19  Yes Michel Bickers, MD  feeding supplement, ENSURE ENLIVE, (ENSURE  ENLIVE) LIQD Take 237 mLs by mouth 3 (three) times daily between meals. Patient taking differently: Take 1 Bottle by mouth daily.  01/20/19   Matilde Haymaker, MD   No Known Allergies Review of Systems  Unable to perform ROS: Intubated    Physical Exam Vitals signs and nursing note reviewed.  Constitutional:      General: She is in acute distress.     Appearance: She is ill-appearing.  Cardiovascular:     Rate and Rhythm: Normal rate.     Comments: At x100s Pulmonary:     Comments: Intubated/ventilated, sedated Skin:    General: Skin is warm and dry.  Neurological:     Comments: Intubated/ventilated, sedated     Vital Signs: BP (!) 106/53   Pulse (!) 122   Temp 98.3 F (36.8 C) (Axillary)   Resp (!) 26   Ht 5' 2"  (1.575 m)   Wt 39.2 kg   SpO2 91%   BMI 15.81 kg/m  Pain Scale: CPOT   Pain Score: 6    SpO2: SpO2: 91 % O2 Device:SpO2: 91 % O2 Flow Rate: .   IO: Intake/output summary:   Intake/Output Summary (Last 24 hours) at 08/31/19 1500 Last data filed at 2019/08/31 1144 Gross per 24 hour  Intake 3061.02 ml  Output 3415 ml  Net -353.98 ml    LBM: Last BM Date: 08/08/19 Baseline Weight: Weight: 39.3 kg Most recent weight: Weight: 39.2 kg     Palliative Assessment/Data:   Flowsheet Rows     Most Recent Value  Intake Tab  Referral Department  Hospitalist  Unit at Time of Referral  ICU  Palliative Care Primary Diagnosis  Other (Comment)  Date Notified  08/21/2019  Palliative Care Type  New Palliative care  Reason for referral  Clarify Goals of Care, Psychosocial or Spiritual support  Date of Admission  08/18/2019  Date first seen by Palliative Care  2019/08/31  # of days Palliative referral response time  2 Day(s)  # of days IP prior to Palliative referral  1  Clinical Assessment  Palliative Performance Scale Score  10%  Pain Max last 24 hours  Not able to report  Pain Min Last 24 hours  Not able to report  Dyspnea Max Last 24 Hours  Not able to report   Dyspnea Min Last 24 hours  Not able to report  Psychosocial & Spiritual Assessment  Palliative Care Outcomes      Time In: 1220 Time Out: 1330 Time Total: 70 minutes Greater than 50%  of this time was spent counseling and coordinating care related to the above assessment and plan.  Signed by: Drue Novel, NP   Please contact Palliative Medicine Team phone at [phone] for questions and concerns.  For individual provider: See Shea Evans

## 2019-08-22 NOTE — Progress Notes (Signed)
PHARMACY - ADULT TOTAL PARENTERAL NUTRITION CONSULT NOTE  Indication:  Prolonged ileus  Patient Measurements: Height: 5\' 2"  (157.5 cm) Weight: 86 lb 6.7 oz (39.2 kg) IBW/kg (Calculated) : 50.1 TPN AdjBW (KG): 39.3 Body mass index is 15.81 kg/m. Usual Weight: 100 lbs  Assessment:  87 YOF presented on 08/18/2019 with abdominal pain x 5 days, nausea and vomiting.  Found to have SBO and is s/p ex-lap with partial colectomy and colostomy on 08/17/2019.  Per documentation, patient has severe malnutrition in setting of HIV and was not eating well for 5 days PTA.  She also lost 20 lbs unintentionally.  Pharmacy consulted to manage TPN for prolonged ileus.  She is at risk for refeeding syndrome.  GI: NG O/P 464mL, stool O/P 1266mL - PPI IV Endo: hypoglycemic Insulin requirements in the past 24 hours: 3 Lytes: Na 151, CoCa 9.6, Mg 3 Renal: SCr 2.48, BUN 80 - UOP 3.8 ml/kg/hr, net +4.7L Pulm: intubated, FiO2 60% Cards: MAP 60-70s, tachy, CVP 7-14 - Levophed + phenylephrine Hepatobil: LFTs increasing, tbili 5.2 (yellow eyes) Neuro: Precedex, Fentanyl  ID: [email or web address] for suspected aspiration - Tmax 103.1, WBC 19.7, LA 4.8 TPN Access: CVC double lumen placed 08/17/2019 TPN start date: 08/08/19 - only completed a partial bag on her first day   Nutritional Goals (per RD rec on 11/16): 1300-1500 kCal, 65-80gm, >/= 1.3L per day  Current Nutrition:  NPO  Plan:  Hold TPN that was started 11/17 PM due to hyperkalemia Restart D5 at 67ml/hr (subsequently increased to 62ml/hr by CCM) while TPN is off Started lipid on day 1 of TPN in ICU patient d/t severe malnutrition Re-attempt to iInitiate TPN at 25 ml/hr (goal rate 60 ml/hr) TPN will provide 31g AA, 84g CHO and 17g ILE for a total of 584 kCal, meeting ~42% of patient needs Electrolytes in TPN: removing all electrolytes except calcium for now, max acetate Daily multivitamin and thiamine in TPN Remove trace elements and add zinc 5mg , selenium 26mcg,  chromium 22mcg Continue sensitive SSI Q6H  Reduce D5W when TPN starts F/u AM labs and care plan  Salome Arnt, PharmD, BCPS Clinical Pharmacist Please see AMION for all pharmacy numbers 08/14/19 11:32 AM

## 2019-08-22 DEATH — deceased

## 2019-10-19 ENCOUNTER — Other Ambulatory Visit: Payer: Medicare Other

## 2019-11-20 ENCOUNTER — Encounter: Payer: Medicare Other | Admitting: Internal Medicine

## 2021-01-30 IMAGING — CT CT ANGIOGRAPHY CHEST
1 of 6 series · 3 of 16 positions shown · IV contrast (omnipaque)
Comparison: None

CLINICAL DATA: Suspected pulmonary embolism, negative D-dimer

EXAM:
CT ANGIOGRAPHY CHEST WITH CONTRAST
TECHNIQUE: Multidetector CT imaging of the chest was performed using the
standard protocol during bolus administration of intravenous
contrast. Multiplanar CT image reconstructions and MIPs were
obtained to evaluate the vascular anatomy.
CONTRAST:  50mL OMNIPAQUE IOHEXOL 350 MG/ML SOLN IV

[Series 8: pe thins · axial · 0.49mm/px · z∈[+1080,+1207]mm · 3 of 365 slices shown]
[im 92/365  lung]
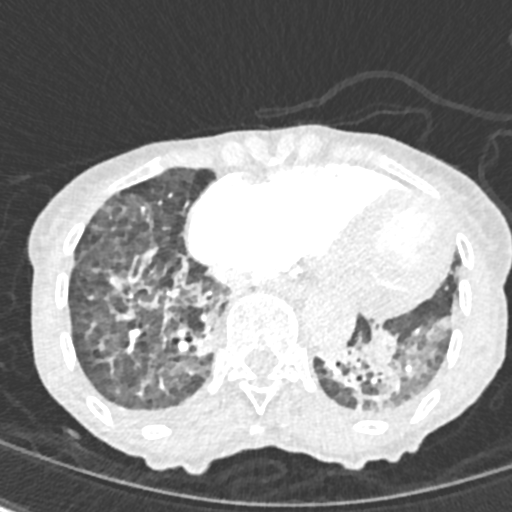
[im 183/365  soft-tissue]
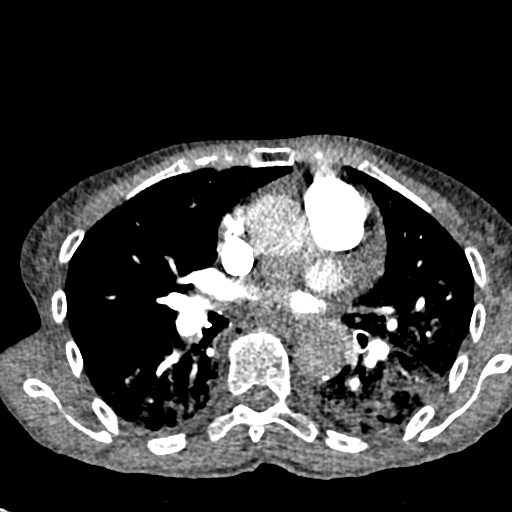
[im 274/365  lung]
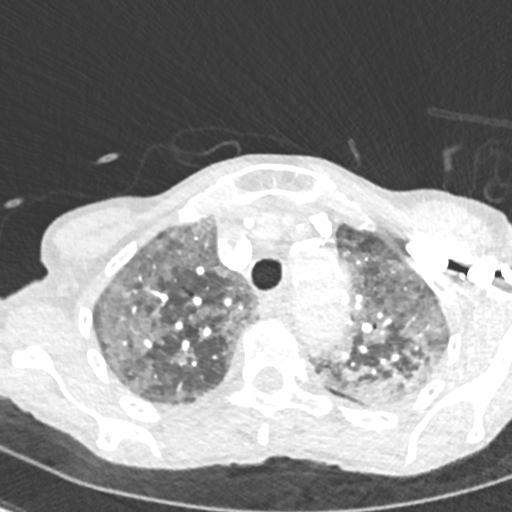

[3 of 16 positions shown; findings below may reference images not displayed]

FINDINGS: Cardiovascular: Atherosclerotic calcifications of aorta, proximal
great vessels and coronary arteries. Aorta normal caliber without
aneurysm or dissection. Pulmonary arteries well opacified and
patent. No evidence of pulmonary embolism. No pericardial effusion.
Cardiac chambers appear enlarged.

Mediastinum/Nodes: Esophagus unremarkable. Base of cervical region
normal appearance. No thoracic adenopathy.

Lungs/Pleura: Consolidation LEFT lower lobe. Additional diffuse
BILATERAL alveolar infiltrates throughout both lungs which could
represent pneumonia or edema. Minimal bibasilar atelectasis. No
significant pleural effusion or pneumothorax.

Upper Abdomen: 2 calculi at upper pole of LEFT kidney, larger 10 mm
diameter.

Musculoskeletal: No osseous abnormalities

Review of the MIP images confirms the above findings.
IMPRESSION: No evidence of pulmonary embolism.

Scattered atherosclerotic calcifications including coronary
arteries.

Diffuse BILATERAL pulmonary infiltrates greatest in LEFT lower lobe,
could represent pneumonia or pulmonary edema.

Aortic Atherosclerosis ([email or web address]).
